# Patient Record
Sex: Female | Born: 1935
Health system: Southern US, Community
[De-identification: ages and names within clinical notes are randomized; demographics above are authoritative.]

## PROBLEM LIST (undated history)

## (undated) DIAGNOSIS — A809 Acute poliomyelitis, unspecified: Secondary | ICD-10-CM

## (undated) DIAGNOSIS — I10 Essential (primary) hypertension: Secondary | ICD-10-CM

## (undated) HISTORY — PX: ABDOMINAL HYSTERECTOMY: SHX81

## (undated) HISTORY — PX: CHOLECYSTECTOMY: SHX55

---

## 2012-11-30 DIAGNOSIS — N951 Menopausal and female climacteric states: Secondary | ICD-10-CM | POA: Insufficient documentation

## 2013-08-10 DIAGNOSIS — E119 Type 2 diabetes mellitus without complications: Secondary | ICD-10-CM | POA: Insufficient documentation

## 2013-08-10 DIAGNOSIS — E7800 Pure hypercholesterolemia, unspecified: Secondary | ICD-10-CM | POA: Insufficient documentation

## 2013-08-10 DIAGNOSIS — I10 Essential (primary) hypertension: Secondary | ICD-10-CM

## 2013-08-10 DIAGNOSIS — M1990 Unspecified osteoarthritis, unspecified site: Secondary | ICD-10-CM | POA: Insufficient documentation

## 2014-11-16 DIAGNOSIS — M545 Low back pain, unspecified: Secondary | ICD-10-CM | POA: Insufficient documentation

## 2014-11-16 DIAGNOSIS — G8929 Other chronic pain: Secondary | ICD-10-CM | POA: Insufficient documentation

## 2014-11-16 DIAGNOSIS — M25559 Pain in unspecified hip: Secondary | ICD-10-CM

## 2014-11-27 DIAGNOSIS — M1612 Unilateral primary osteoarthritis, left hip: Secondary | ICD-10-CM | POA: Insufficient documentation

## 2014-11-27 DIAGNOSIS — M5136 Other intervertebral disc degeneration, lumbar region: Secondary | ICD-10-CM | POA: Insufficient documentation

## 2015-01-03 DIAGNOSIS — M4316 Spondylolisthesis, lumbar region: Secondary | ICD-10-CM | POA: Insufficient documentation

## 2015-02-07 DIAGNOSIS — M5416 Radiculopathy, lumbar region: Secondary | ICD-10-CM | POA: Insufficient documentation

## 2015-04-24 DIAGNOSIS — S51019A Laceration without foreign body of unspecified elbow, initial encounter: Secondary | ICD-10-CM | POA: Insufficient documentation

## 2015-04-30 DIAGNOSIS — M48061 Spinal stenosis, lumbar region without neurogenic claudication: Secondary | ICD-10-CM | POA: Insufficient documentation

## 2015-05-30 DIAGNOSIS — R296 Repeated falls: Secondary | ICD-10-CM | POA: Insufficient documentation

## 2015-05-30 DIAGNOSIS — R42 Dizziness and giddiness: Secondary | ICD-10-CM | POA: Insufficient documentation

## 2015-05-30 DIAGNOSIS — R351 Nocturia: Secondary | ICD-10-CM | POA: Insufficient documentation

## 2016-08-15 DIAGNOSIS — M5136 Other intervertebral disc degeneration, lumbar region: Secondary | ICD-10-CM | POA: Diagnosis not present

## 2016-08-15 DIAGNOSIS — R410 Disorientation, unspecified: Secondary | ICD-10-CM | POA: Diagnosis not present

## 2016-08-15 DIAGNOSIS — I10 Essential (primary) hypertension: Secondary | ICD-10-CM | POA: Diagnosis not present

## 2016-08-15 DIAGNOSIS — Z23 Encounter for immunization: Secondary | ICD-10-CM | POA: Diagnosis not present

## 2016-08-15 DIAGNOSIS — E7800 Pure hypercholesterolemia, unspecified: Secondary | ICD-10-CM | POA: Diagnosis not present

## 2016-08-15 DIAGNOSIS — E119 Type 2 diabetes mellitus without complications: Secondary | ICD-10-CM | POA: Diagnosis not present

## 2016-08-26 DIAGNOSIS — R410 Disorientation, unspecified: Secondary | ICD-10-CM | POA: Diagnosis not present

## 2016-08-26 DIAGNOSIS — R4182 Altered mental status, unspecified: Secondary | ICD-10-CM | POA: Diagnosis not present

## 2017-05-20 ENCOUNTER — Inpatient Hospital Stay (HOSPITAL_BASED_OUTPATIENT_CLINIC_OR_DEPARTMENT_OTHER)
Admission: EM | Admit: 2017-05-20 | Discharge: 2017-05-27 | DRG: 308 | Disposition: A | Discharge status: Rehabilitation Facility | Payer: Medicare Other | Attending: Internal Medicine | Admitting: Internal Medicine

## 2017-05-20 ENCOUNTER — Other Ambulatory Visit: Payer: Self-pay

## 2017-05-20 ENCOUNTER — Emergency Department (HOSPITAL_BASED_OUTPATIENT_CLINIC_OR_DEPARTMENT_OTHER): Payer: Medicare Other

## 2017-05-20 ENCOUNTER — Encounter (HOSPITAL_BASED_OUTPATIENT_CLINIC_OR_DEPARTMENT_OTHER): Payer: Self-pay | Admitting: Emergency Medicine

## 2017-05-20 DIAGNOSIS — I674 Hypertensive encephalopathy: Secondary | ICD-10-CM | POA: Diagnosis present

## 2017-05-20 DIAGNOSIS — R4701 Aphasia: Secondary | ICD-10-CM | POA: Diagnosis not present

## 2017-05-20 DIAGNOSIS — R488 Other symbolic dysfunctions: Secondary | ICD-10-CM | POA: Diagnosis present

## 2017-05-20 DIAGNOSIS — Z538 Procedure and treatment not carried out for other reasons: Secondary | ICD-10-CM | POA: Diagnosis not present

## 2017-05-20 DIAGNOSIS — R296 Repeated falls: Secondary | ICD-10-CM | POA: Diagnosis not present

## 2017-05-20 DIAGNOSIS — I160 Hypertensive urgency: Secondary | ICD-10-CM | POA: Diagnosis not present

## 2017-05-20 DIAGNOSIS — Z950 Presence of cardiac pacemaker: Secondary | ICD-10-CM

## 2017-05-20 DIAGNOSIS — R7989 Other specified abnormal findings of blood chemistry: Secondary | ICD-10-CM | POA: Diagnosis not present

## 2017-05-20 DIAGNOSIS — R001 Bradycardia, unspecified: Secondary | ICD-10-CM | POA: Diagnosis not present

## 2017-05-20 DIAGNOSIS — Z8612 Personal history of poliomyelitis: Secondary | ICD-10-CM | POA: Diagnosis not present

## 2017-05-20 DIAGNOSIS — K5900 Constipation, unspecified: Secondary | ICD-10-CM | POA: Diagnosis present

## 2017-05-20 DIAGNOSIS — Z90710 Acquired absence of both cervix and uterus: Secondary | ICD-10-CM | POA: Diagnosis not present

## 2017-05-20 DIAGNOSIS — R29702 NIHSS score 2: Secondary | ICD-10-CM | POA: Diagnosis present

## 2017-05-20 DIAGNOSIS — Z79899 Other long term (current) drug therapy: Secondary | ICD-10-CM | POA: Diagnosis not present

## 2017-05-20 DIAGNOSIS — F05 Delirium due to known physiological condition: Secondary | ICD-10-CM | POA: Diagnosis not present

## 2017-05-20 DIAGNOSIS — R278 Other lack of coordination: Secondary | ICD-10-CM | POA: Diagnosis present

## 2017-05-20 DIAGNOSIS — R9401 Abnormal electroencephalogram [EEG]: Secondary | ICD-10-CM | POA: Diagnosis present

## 2017-05-20 DIAGNOSIS — I10 Essential (primary) hypertension: Secondary | ICD-10-CM | POA: Diagnosis not present

## 2017-05-20 DIAGNOSIS — D62 Acute posthemorrhagic anemia: Secondary | ICD-10-CM | POA: Diagnosis not present

## 2017-05-20 DIAGNOSIS — I361 Nonrheumatic tricuspid (valve) insufficiency: Secondary | ICD-10-CM | POA: Diagnosis not present

## 2017-05-20 DIAGNOSIS — I69398 Other sequelae of cerebral infarction: Secondary | ICD-10-CM | POA: Diagnosis not present

## 2017-05-20 DIAGNOSIS — R29704 NIHSS score 4: Secondary | ICD-10-CM | POA: Diagnosis not present

## 2017-05-20 DIAGNOSIS — I459 Conduction disorder, unspecified: Secondary | ICD-10-CM | POA: Diagnosis present

## 2017-05-20 DIAGNOSIS — Z803 Family history of malignant neoplasm of breast: Secondary | ICD-10-CM

## 2017-05-20 DIAGNOSIS — G9340 Encephalopathy, unspecified: Secondary | ICD-10-CM

## 2017-05-20 DIAGNOSIS — I63532 Cerebral infarction due to unspecified occlusion or stenosis of left posterior cerebral artery: Secondary | ICD-10-CM | POA: Diagnosis not present

## 2017-05-20 DIAGNOSIS — I442 Atrioventricular block, complete: Secondary | ICD-10-CM | POA: Diagnosis not present

## 2017-05-20 DIAGNOSIS — R413 Other amnesia: Secondary | ICD-10-CM | POA: Diagnosis not present

## 2017-05-20 DIAGNOSIS — E46 Unspecified protein-calorie malnutrition: Secondary | ICD-10-CM | POA: Diagnosis not present

## 2017-05-20 DIAGNOSIS — Z800 Family history of malignant neoplasm of digestive organs: Secondary | ICD-10-CM

## 2017-05-20 DIAGNOSIS — E876 Hypokalemia: Secondary | ICD-10-CM | POA: Diagnosis present

## 2017-05-20 DIAGNOSIS — K5901 Slow transit constipation: Secondary | ICD-10-CM

## 2017-05-20 DIAGNOSIS — N390 Urinary tract infection, site not specified: Secondary | ICD-10-CM | POA: Diagnosis not present

## 2017-05-20 DIAGNOSIS — B952 Enterococcus as the cause of diseases classified elsewhere: Secondary | ICD-10-CM | POA: Diagnosis present

## 2017-05-20 DIAGNOSIS — F0390 Unspecified dementia without behavioral disturbance: Secondary | ICD-10-CM | POA: Diagnosis present

## 2017-05-20 DIAGNOSIS — I69320 Aphasia following cerebral infarction: Secondary | ICD-10-CM | POA: Diagnosis not present

## 2017-05-20 DIAGNOSIS — I441 Atrioventricular block, second degree: Secondary | ICD-10-CM | POA: Diagnosis not present

## 2017-05-20 DIAGNOSIS — N179 Acute kidney failure, unspecified: Secondary | ICD-10-CM | POA: Diagnosis not present

## 2017-05-20 DIAGNOSIS — I639 Cerebral infarction, unspecified: Secondary | ICD-10-CM | POA: Diagnosis not present

## 2017-05-20 DIAGNOSIS — I6389 Other cerebral infarction: Secondary | ICD-10-CM | POA: Diagnosis not present

## 2017-05-20 DIAGNOSIS — R7303 Prediabetes: Secondary | ICD-10-CM | POA: Diagnosis present

## 2017-05-20 DIAGNOSIS — E785 Hyperlipidemia, unspecified: Secondary | ICD-10-CM | POA: Diagnosis present

## 2017-05-20 DIAGNOSIS — G4700 Insomnia, unspecified: Secondary | ICD-10-CM | POA: Diagnosis present

## 2017-05-20 DIAGNOSIS — R0989 Other specified symptoms and signs involving the circulatory and respiratory systems: Secondary | ICD-10-CM | POA: Diagnosis not present

## 2017-05-20 DIAGNOSIS — I4430 Unspecified atrioventricular block: Secondary | ICD-10-CM | POA: Diagnosis not present

## 2017-05-20 DIAGNOSIS — I169 Hypertensive crisis, unspecified: Secondary | ICD-10-CM | POA: Diagnosis not present

## 2017-05-20 DIAGNOSIS — R262 Difficulty in walking, not elsewhere classified: Secondary | ICD-10-CM | POA: Diagnosis present

## 2017-05-20 DIAGNOSIS — R339 Retention of urine, unspecified: Secondary | ICD-10-CM | POA: Diagnosis present

## 2017-05-20 DIAGNOSIS — I6330 Cerebral infarction due to thrombosis of unspecified cerebral artery: Secondary | ICD-10-CM

## 2017-05-20 DIAGNOSIS — I6381 Other cerebral infarction due to occlusion or stenosis of small artery: Secondary | ICD-10-CM | POA: Diagnosis not present

## 2017-05-20 DIAGNOSIS — A499 Bacterial infection, unspecified: Secondary | ICD-10-CM | POA: Diagnosis not present

## 2017-05-20 HISTORY — DX: Essential (primary) hypertension: I10

## 2017-05-20 HISTORY — DX: Acute poliomyelitis, unspecified: A80.9

## 2017-05-20 LAB — COMPREHENSIVE METABOLIC PANEL
ALBUMIN: 3.6 g/dL (ref 3.5–5.0)
ALK PHOS: 76 U/L (ref 38–126)
ALT: 14 U/L (ref 14–54)
ANION GAP: 6 (ref 5–15)
AST: 14 U/L — ABNORMAL LOW (ref 15–41)
BILIRUBIN TOTAL: 0.5 mg/dL (ref 0.3–1.2)
BUN: 22 mg/dL — ABNORMAL HIGH (ref 6–20)
CALCIUM: 9 mg/dL (ref 8.9–10.3)
CO2: 28 mmol/L (ref 22–32)
CREATININE: 1.01 mg/dL — AB (ref 0.44–1.00)
Chloride: 108 mmol/L (ref 101–111)
GFR calc non Af Amer: 49 mL/min — ABNORMAL LOW (ref >60)
GFR, EST AFRICAN AMERICAN: 57 mL/min — AB (ref >60)
GLUCOSE: 96 mg/dL (ref 65–99)
Potassium: 3.6 mmol/L (ref 3.5–5.1)
Sodium: 142 mmol/L (ref 135–145)
TOTAL PROTEIN: 6.4 g/dL — AB (ref 6.5–8.1)

## 2017-05-20 LAB — URINALYSIS, ROUTINE W REFLEX MICROSCOPIC
BILIRUBIN URINE: NEGATIVE
Glucose, UA: NEGATIVE mg/dL
HGB URINE DIPSTICK: NEGATIVE
KETONES UR: NEGATIVE mg/dL
NITRITE: NEGATIVE
PH: 6 (ref 5.0–8.0)
Protein, ur: 100 mg/dL — AB
SPECIFIC GRAVITY, URINE: 1.025 (ref 1.005–1.030)

## 2017-05-20 LAB — URINALYSIS, MICROSCOPIC (REFLEX)

## 2017-05-20 LAB — CBC WITH DIFFERENTIAL/PLATELET
Basophils Absolute: 0 10*3/uL (ref 0.0–0.1)
Basophils Relative: 0 %
Eosinophils Absolute: 0.1 10*3/uL (ref 0.0–0.7)
Eosinophils Relative: 2 %
HEMATOCRIT: 37.3 % (ref 36.0–46.0)
HEMOGLOBIN: 12.1 g/dL (ref 12.0–15.0)
LYMPHS ABS: 1.1 10*3/uL (ref 0.7–4.0)
Lymphocytes Relative: 15 %
MCH: 28.3 pg (ref 26.0–34.0)
MCHC: 32.4 g/dL (ref 30.0–36.0)
MCV: 87.4 fL (ref 78.0–100.0)
MONOS PCT: 7 %
Monocytes Absolute: 0.5 10*3/uL (ref 0.1–1.0)
NEUTROS ABS: 5.5 10*3/uL (ref 1.7–7.7)
NEUTROS PCT: 76 %
Platelets: 230 10*3/uL (ref 150–400)
RBC: 4.27 MIL/uL (ref 3.87–5.11)
RDW: 14.6 % (ref 11.5–15.5)
WBC: 7.2 10*3/uL (ref 4.0–10.5)

## 2017-05-20 LAB — TROPONIN I: Troponin I: <0.03 ng/mL (ref <0.03)

## 2017-05-20 MED ORDER — HYDRALAZINE HCL 25 MG PO TABS: 25.0000 mg | ORAL_TABLET | Freq: Four times a day (QID) | ORAL | Status: DC | PRN

## 2017-05-20 MED ORDER — LABETALOL HCL 5 MG/ML IV SOLN: 10.0000 mg | Freq: Once | INTRAVENOUS | Status: DC

## 2017-05-20 MED ORDER — HYDRALAZINE HCL 20 MG/ML IJ SOLN
10.0000 mg | Freq: Once | INTRAMUSCULAR | Status: AC
  Administered 2017-05-20: 10 mg via INTRAVENOUS
  Filled 2017-05-20: qty 1

## 2017-05-20 MED ORDER — LISINOPRIL 20 MG PO TABS
20.0000 mg | ORAL_TABLET | Freq: Every day | ORAL | Status: DC
  Administered 2017-05-20 – 2017-05-25 (×6): 20 mg via ORAL
  Filled 2017-05-20 (×2): qty 1
  Filled 2017-05-20: qty 2
  Filled 2017-05-20 (×3): qty 1

## 2017-05-20 MED ORDER — AMLODIPINE BESYLATE 10 MG PO TABS
10.0000 mg | ORAL_TABLET | Freq: Every day | ORAL | Status: DC
  Administered 2017-05-20 – 2017-05-22 (×3): 10 mg via ORAL
  Filled 2017-05-20: qty 1
  Filled 2017-05-20: qty 2
  Filled 2017-05-20: qty 1

## 2017-05-20 NOTE — ED Notes (Signed)
Pt on monitor 

## 2017-05-20 NOTE — ED Notes (Signed)
Talked to Alinda Moneyony  (pt son)  He was given room number and stated that he would see his mom in am

## 2017-05-20 NOTE — Significant Event (Signed)
[facility]. Cardiology already consulted. Patient has Hypertensive urgency and heart block.

## 2017-05-20 NOTE — ED Triage Notes (Signed)
Pt having some expressive aphasia for over a year.  Other people have noted some memory loss in past few months.  Son states he noticed short term memory loss for four days.  Negative BEFAST and VAN.  Pt has not been taking her BP medication since march.  Has PCP appointment next week.

## 2017-05-20 NOTE — ED Provider Notes (Signed)
[facility] EMERGENCY DEPARTMENT Provider Note   CSN: [id number] Arrival date & time: 05/20/17  1042     History   Chief Complaint Chief Complaint  Patient presents with  . Memory Loss    HPI Nicole [name] is a 83 y.o. female.  HPI Patient lives with son.  Has noticed increased confusion over the last 6 days.  Patient is also had some difficulty with word finding.  Noted not to be taking her blood pressure medication for the past 9 months.  She does have frequent falls but none recently.  Currently the patient is denying any pain including headache, neck pain, chest pain, back pain, abdominal pain.  No recent fever or chills. Past Medical History:  Diagnosis Date  . Hypertension   . Polio     There are no active problems to display for this patient.     OB History    No data available       Home Medications    Prior to Admission medications   Not on File    Family History No family history on file.  Social History Social History   Tobacco Use  . Smoking status: Never Smoker  . Smokeless tobacco: Never Used  Substance Use Topics  . Alcohol use: Not on file  . Drug use: Not on file     Allergies   Patient has no known allergies.   Review of Systems Review of Systems  Constitutional: Negative for chills and fever.  Eyes: Negative for visual disturbance.  Respiratory: Negative for cough and shortness of breath.   Cardiovascular: Negative for chest pain.  Gastrointestinal: Negative for abdominal pain, nausea and vomiting.  Genitourinary: Negative for difficulty urinating, dysuria and frequency.  Musculoskeletal: Negative for back pain and neck pain.  Skin: Negative for rash and wound.  Neurological: Positive for speech difficulty. Negative for dizziness, weakness, light-headedness, numbness and headaches.  Psychiatric/Behavioral: Positive for confusion.  All other systems reviewed and are negative.    Physical Exam Updated Vital  Signs BP (!) 200/48 (BP Location: Left Arm)   Pulse (!) 41   Temp 98.1 F (36.7 C) (Oral)   Resp 20   SpO2 100%   Physical Exam  Constitutional: She is oriented to person, place, and time. She appears well-developed and well-nourished.  HENT:  Head: Normocephalic and atraumatic.  Mouth/Throat: Oropharynx is clear and moist.  No obvious scalp trauma.  Eyes: EOM are normal. Pupils are equal, round, and reactive to light.  Neck: Normal range of motion. Neck supple.  No posterior midline cervical tenderness to palpation.  Cardiovascular: Regular rhythm.  Bradycardia  Pulmonary/Chest: Effort normal.  Few crackles in bilateral bases.  Abdominal: Soft. Bowel sounds are normal. There is no tenderness. There is no rebound and no guarding.  Musculoskeletal: Normal range of motion. She exhibits no edema or tenderness.  No midline thoracic or lumbar tenderness.  No CVA tenderness.  No lower extremity swelling, asymmetry or tenderness.  Neurological: She is alert and oriented to person, place, and time.  Patient is alert and oriented x3 with clear, goal oriented speech. Patient has 5/5 motor in all extremities. Sensation is intact to light touch. Bilateral finger-to-nose is normal with no signs of dysmetria. Patient has a normal gait and walks without assistance.  Skin: Skin is warm and dry. Capillary refill takes less than 2 seconds. No rash noted. No erythema.  Psychiatric: She has a normal mood and affect. Her behavior is normal.  Nursing note and  vitals reviewed.    ED Treatments / Results  Labs (all labs ordered are listed, but only abnormal results are displayed) Labs Reviewed  COMPREHENSIVE METABOLIC PANEL - Abnormal; Notable for the following components:      Result Value   BUN 22 (*)    Creatinine, Ser 1.01 (*)    Total Protein 6.4 (*)    AST 14 (*)    GFR calc non Af Amer 49 (*)    GFR calc Af Amer 57 (*)    All other components within normal limits  URINALYSIS, ROUTINE W  REFLEX MICROSCOPIC - Abnormal; Notable for the following components:   APPearance CLOUDY (*)    Protein, ur 100 (*)    Leukocytes, UA SMALL (*)    All other components within normal limits  URINALYSIS, MICROSCOPIC (REFLEX) - Abnormal; Notable for the following components:   Bacteria, UA FEW (*)    Squamous Epithelial / LPF 0-5 (*)    All other components within normal limits  URINE CULTURE  CBC WITH DIFFERENTIAL/PLATELET  TROPONIN I    EKG  EKG Interpretation  Date/Time:  Wednesday May 20 2017 11:28:02 EST Ventricular Rate:  40 PR Interval:    QRS Duration: 101 QT Interval:  472 QTC Calculation: 385 R Axis:   -21 Text Interpretation:  Sinus bradycardia LVH with secondary repolarization abnormality Confirmed by Loren RacerYelverton, Jere Vanburen ([id number]) on 05/20/2017 2:36:26 PM       Radiology Ct Head Wo Contrast  Result Date: 05/20/2017 CLINICAL DATA:  83 year old female with memory loss symptoms for the past few months, new onset short-term memory changes in the past 4 days. EXAM: CT HEAD WITHOUT CONTRAST TECHNIQUE: Contiguous axial images were obtained from the base of the skull through the vertex without intravenous contrast. COMPARISON:  [facility] without contrast 08/26/2016 FINDINGS: Brain: Stable cerebral volume since March. Ex vacuo appearing mild for age ventricular prominence. No evidence of transependymal edema. No midline shift, mass effect, or evidence of intracranial mass lesion. No acute intracranial hemorrhage identified. Patchy and confluent mostly frontal horn region periventricular white matter hypodensity appears stable. No cortical encephalomalacia identified. No cortically based acute infarct identified. Vascular: Calcified atherosclerosis at the skull base. No suspicious intracranial vascular hyperdensity. Skull: Stable.  No acute osseous abnormality identified. Sinuses/Orbits: Visualized paranasal sinuses and mastoids are stable and well  pneumatized. Other: Stable and negative visualized orbit and scalp soft tissues. IMPRESSION: No acute intracranial abnormality and stable non contrast CT appearance of the brain since March 2018. Cerebral volume loss with ex vacuo appearing ventricular enlargement, and nonspecific frontal horn region chronic white matter changes. Electronically Signed   By: Odessa FlemingH  Hall M.D.   On: 05/20/2017 11:56    Procedures Procedures (including critical care time)  Medications Ordered in ED Medications  hydrALAZINE (APRESOLINE) injection 10 mg (10 mg Intravenous Given 05/20/17 1211)     Initial Impression / Assessment and Plan / ED Course  I have reviewed the triage vital signs and the nursing notes.  Pertinent labs & imaging results that were available during my care of the patient were reviewed by me and considered in my medical decision making (see chart for details).    Given dose of hydralazine with improvement of blood pressure.  CT head without acute findings.  Discussed with Dr. Swazil[place] of cardiology who reviewed the patient's EKG.  May need pacemaker placed for heart block.  Discussed with Dr. Dartha Lodgegbata who will accept patient in transfer to telemetry bed.  Final  Clinical Impressions(s) / ED Diagnoses   Final diagnoses:  Hypertensive urgency  Mobitz type 2 second degree heart block    ED Discharge Orders    None       Loren Racer, MD 05/20/17 1437

## 2017-05-21 ENCOUNTER — Inpatient Hospital Stay (HOSPITAL_COMMUNITY): Payer: Medicare Other

## 2017-05-21 ENCOUNTER — Inpatient Hospital Stay (HOSPITAL_COMMUNITY)
Admission: EM | Disposition: A | Discharge status: Rehabilitation Facility | Payer: Self-pay | Source: Home / Self Care | Attending: Internal Medicine

## 2017-05-21 ENCOUNTER — Encounter (HOSPITAL_COMMUNITY): Payer: Self-pay | Admitting: Family Medicine

## 2017-05-21 DIAGNOSIS — I4430 Unspecified atrioventricular block: Secondary | ICD-10-CM

## 2017-05-21 DIAGNOSIS — R001 Bradycardia, unspecified: Secondary | ICD-10-CM

## 2017-05-21 DIAGNOSIS — R413 Other amnesia: Secondary | ICD-10-CM | POA: Diagnosis present

## 2017-05-21 DIAGNOSIS — G9340 Encephalopathy, unspecified: Secondary | ICD-10-CM | POA: Diagnosis present

## 2017-05-21 DIAGNOSIS — I361 Nonrheumatic tricuspid (valve) insufficiency: Secondary | ICD-10-CM

## 2017-05-21 DIAGNOSIS — Z538 Procedure and treatment not carried out for other reasons: Secondary | ICD-10-CM

## 2017-05-21 DIAGNOSIS — I442 Atrioventricular block, complete: Secondary | ICD-10-CM

## 2017-05-21 DIAGNOSIS — I459 Conduction disorder, unspecified: Secondary | ICD-10-CM | POA: Diagnosis present

## 2017-05-21 HISTORY — PX: PACEMAKER IMPLANT: EP1218

## 2017-05-21 LAB — BASIC METABOLIC PANEL
Anion gap: 6 (ref 5–15)
BUN: 15 mg/dL (ref 6–20)
CALCIUM: 9.2 mg/dL (ref 8.9–10.3)
CO2: 28 mmol/L (ref 22–32)
CREATININE: 0.88 mg/dL (ref 0.44–1.00)
Chloride: 109 mmol/L (ref 101–111)
GFR calc Af Amer: >60 mL/min (ref >60)
GFR, EST NON AFRICAN AMERICAN: 58 mL/min — AB (ref >60)
GLUCOSE: 113 mg/dL — AB (ref 65–99)
Potassium: 3.4 mmol/L — ABNORMAL LOW (ref 3.5–5.1)
SODIUM: 143 mmol/L (ref 135–145)

## 2017-05-21 LAB — MRSA PCR SCREENING: MRSA by PCR: NEGATIVE

## 2017-05-21 LAB — SURGICAL PCR SCREEN
MRSA, PCR: NEGATIVE
STAPHYLOCOCCUS AUREUS: POSITIVE — AB

## 2017-05-21 LAB — URINE CULTURE

## 2017-05-21 LAB — TROPONIN I
TROPONIN I: 0.03 ng/mL — AB (ref <0.03)
Troponin I: <0.03 ng/mL (ref <0.03)
Troponin I: <0.03 ng/mL (ref <0.03)

## 2017-05-21 LAB — CBC
HEMATOCRIT: 41 % (ref 36.0–46.0)
Hemoglobin: 13.2 g/dL (ref 12.0–15.0)
MCH: 28.3 pg (ref 26.0–34.0)
MCHC: 32.2 g/dL (ref 30.0–36.0)
MCV: 87.8 fL (ref 78.0–100.0)
Platelets: 213 10*3/uL (ref 150–400)
RBC: 4.67 MIL/uL (ref 3.87–5.11)
RDW: 14.9 % (ref 11.5–15.5)
WBC: 14.2 10*3/uL — ABNORMAL HIGH (ref 4.0–10.5)

## 2017-05-21 LAB — RPR: RPR: NONREACTIVE

## 2017-05-21 LAB — ECHOCARDIOGRAM COMPLETE: WEIGHTICAEL: 2096 [oz_av]

## 2017-05-21 LAB — AMMONIA: Ammonia: 13 umol/L (ref 9–35)

## 2017-05-21 LAB — CREATININE, SERUM
Creatinine, Ser: 0.92 mg/dL (ref 0.44–1.00)
GFR calc Af Amer: >60 mL/min (ref >60)
GFR calc non Af Amer: 55 mL/min — ABNORMAL LOW (ref >60)

## 2017-05-21 LAB — VITAMIN B12: VITAMIN B 12: 488 pg/mL (ref 180–914)

## 2017-05-21 LAB — MAGNESIUM: Magnesium: 2 mg/dL (ref 1.7–2.4)

## 2017-05-21 LAB — TSH: TSH: 1.246 u[IU]/mL (ref 0.350–4.500)

## 2017-05-21 SURGERY — PACEMAKER IMPLANT
Anesthesia: LOCAL

## 2017-05-21 MED ORDER — PROMETHAZINE HCL 25 MG/ML IJ SOLN
INTRAMUSCULAR | Status: DC | PRN
  Administered 2017-05-21: 12.5 mg via INTRAVENOUS

## 2017-05-21 MED ORDER — SODIUM CHLORIDE 0.9% FLUSH
3.0000 mL | Freq: Two times a day (BID) | INTRAVENOUS | Status: DC
  Administered 2017-05-21 – 2017-05-27 (×9): 3 mL via INTRAVENOUS

## 2017-05-21 MED ORDER — SENNOSIDES-DOCUSATE SODIUM 8.6-50 MG PO TABS: 1.0000 | ORAL_TABLET | Freq: Every evening | ORAL | Status: DC | PRN

## 2017-05-21 MED ORDER — SODIUM CHLORIDE 0.9 % IR SOLN
Status: AC
  Filled 2017-05-21: qty 2

## 2017-05-21 MED ORDER — ONDANSETRON HCL 4 MG/2ML IJ SOLN
INTRAMUSCULAR | Status: DC | PRN
  Administered 2017-05-21: 4 mg via INTRAVENOUS

## 2017-05-21 MED ORDER — SODIUM CHLORIDE 0.9% FLUSH: 3.0000 mL | Freq: Two times a day (BID) | INTRAVENOUS | Status: DC

## 2017-05-21 MED ORDER — CHLORHEXIDINE GLUCONATE CLOTH 2 % EX PADS
6.0000 | MEDICATED_PAD | Freq: Every day | CUTANEOUS | Status: AC
  Administered 2017-05-21 – 2017-05-25 (×4): 6 via TOPICAL

## 2017-05-21 MED ORDER — HEPARIN (PORCINE) IN NACL 2-0.9 UNIT/ML-% IJ SOLN
INTRAMUSCULAR | Status: AC
  Filled 2017-05-21: qty 500

## 2017-05-21 MED ORDER — POTASSIUM CHLORIDE CRYS ER 20 MEQ PO TBCR
20.0000 meq | EXTENDED_RELEASE_TABLET | Freq: Once | ORAL | Status: AC
  Administered 2017-05-21: 20 meq via ORAL
  Filled 2017-05-21: qty 1

## 2017-05-21 MED ORDER — ACETAMINOPHEN 650 MG RE SUPP: 650.0000 mg | Freq: Four times a day (QID) | RECTAL | Status: DC | PRN

## 2017-05-21 MED ORDER — IOPAMIDOL (ISOVUE-370) INJECTION 76%
INTRAVENOUS | Status: DC | PRN
  Administered 2017-05-21: 10 mL via INTRAVENOUS

## 2017-05-21 MED ORDER — MUPIROCIN 2 % EX OINT
1.0000 "application " | TOPICAL_OINTMENT | Freq: Two times a day (BID) | CUTANEOUS | Status: DC
  Administered 2017-05-21 – 2017-05-26 (×10): 1 via NASAL
  Filled 2017-05-21 (×2): qty 22

## 2017-05-21 MED ORDER — BUPIVACAINE HCL (PF) 0.25 % IJ SOLN: INTRAMUSCULAR | Status: DC | PRN

## 2017-05-21 MED ORDER — SODIUM CHLORIDE 0.9 % IV SOLN: 250.0000 mL | INTRAVENOUS | Status: DC

## 2017-05-21 MED ORDER — HYDRALAZINE HCL 20 MG/ML IJ SOLN
5.0000 mg | INTRAMUSCULAR | Status: DC | PRN
  Administered 2017-05-21 – 2017-05-22 (×2): 5 mg via INTRAVENOUS
  Filled 2017-05-21 (×2): qty 1

## 2017-05-21 MED ORDER — PROMETHAZINE HCL 25 MG/ML IJ SOLN
INTRAMUSCULAR | Status: AC
  Filled 2017-05-21: qty 1

## 2017-05-21 MED ORDER — SODIUM CHLORIDE 0.9% FLUSH: 3.0000 mL | INTRAVENOUS | Status: DC | PRN

## 2017-05-21 MED ORDER — ONDANSETRON HCL 4 MG/2ML IJ SOLN: 4.0000 mg | Freq: Four times a day (QID) | INTRAMUSCULAR | Status: DC | PRN

## 2017-05-21 MED ORDER — ONDANSETRON HCL 4 MG PO TABS
4.0000 mg | ORAL_TABLET | Freq: Four times a day (QID) | ORAL | Status: DC | PRN
  Administered 2017-05-26: 4 mg via ORAL
  Filled 2017-05-21: qty 1

## 2017-05-21 MED ORDER — LIDOCAINE HCL (PF) 1 % IJ SOLN
INTRAMUSCULAR | Status: DC | PRN
  Administered 2017-05-21: 45 mL

## 2017-05-21 MED ORDER — SODIUM CHLORIDE 0.9 % IV SOLN: INTRAVENOUS | Status: DC

## 2017-05-21 MED ORDER — IOPAMIDOL (ISOVUE-370) INJECTION 76%
INTRAVENOUS | Status: AC
  Filled 2017-05-21: qty 50

## 2017-05-21 MED ORDER — CHLORHEXIDINE GLUCONATE 4 % EX LIQD: 60.0000 mL | Freq: Once | CUTANEOUS | Status: DC

## 2017-05-21 MED ORDER — HYDROCODONE-ACETAMINOPHEN 5-325 MG PO TABS
1.0000 | ORAL_TABLET | ORAL | Status: DC | PRN
Start: 2017-05-21 — End: 2017-05-26

## 2017-05-21 MED ORDER — ACETAMINOPHEN 325 MG PO TABS: 650.0000 mg | ORAL_TABLET | Freq: Four times a day (QID) | ORAL | Status: DC | PRN

## 2017-05-21 MED ORDER — ONDANSETRON HCL 4 MG/2ML IJ SOLN
INTRAMUSCULAR | Status: AC
  Filled 2017-05-21: qty 2

## 2017-05-21 MED ORDER — CEFAZOLIN SODIUM-DEXTROSE 2-4 GM/100ML-% IV SOLN
INTRAVENOUS | Status: AC
  Filled 2017-05-21: qty 100

## 2017-05-21 MED ORDER — CEFAZOLIN SODIUM-DEXTROSE 2-4 GM/100ML-% IV SOLN
2.0000 g | INTRAVENOUS | Status: AC
  Administered 2017-05-21: 2 g via INTRAVENOUS

## 2017-05-21 MED ORDER — ONDANSETRON HCL 4 MG/2ML IJ SOLN
4.0000 mg | Freq: Four times a day (QID) | INTRAMUSCULAR | Status: DC | PRN
  Administered 2017-05-21: 4 mg via INTRAVENOUS
  Filled 2017-05-21: qty 2

## 2017-05-21 MED ORDER — SODIUM CHLORIDE 0.9 % IR SOLN: 80.0000 mg | Status: DC

## 2017-05-21 MED ORDER — ACETAMINOPHEN 325 MG PO TABS: 325.0000 mg | ORAL_TABLET | ORAL | Status: DC | PRN

## 2017-05-21 MED ORDER — ENOXAPARIN SODIUM 40 MG/0.4ML ~~LOC~~ SOLN
40.0000 mg | SUBCUTANEOUS | Status: DC
  Administered 2017-05-21 – 2017-05-27 (×7): 40 mg via SUBCUTANEOUS
  Filled 2017-05-21 (×6): qty 0.4

## 2017-05-21 MED ORDER — POTASSIUM CHLORIDE IN NACL 20-0.9 MEQ/L-% IV SOLN
INTRAVENOUS | Status: AC
  Administered 2017-05-21: 03:00:00 via INTRAVENOUS
  Filled 2017-05-21: qty 1000

## 2017-05-21 MED ORDER — LIDOCAINE HCL (PF) 1 % IJ SOLN
INTRAMUSCULAR | Status: AC
  Filled 2017-05-21: qty 60

## 2017-05-21 MED ORDER — HEPARIN (PORCINE) IN NACL 2-0.9 UNIT/ML-% IJ SOLN
INTRAMUSCULAR | Status: AC | PRN
  Administered 2017-05-21: 500 mL

## 2017-05-21 SURGICAL SUPPLY — 6 items
CABLE SURGICAL S-101-97-12 (CABLE) ×2 IMPLANT
GUIDEWIRE ANGLED .035X150CM (WIRE) ×2 IMPLANT
KIT MICROINTRODUCER STIFF 5F (SHEATH) ×2 IMPLANT
PAD DEFIB LIFELINK (PAD) ×2 IMPLANT
SHEATH CLASSIC 8F (SHEATH) ×4 IMPLANT
TRAY PACEMAKER INSERTION (PACKS) ×2 IMPLANT

## 2017-05-21 NOTE — Consult Note (Signed)
Cardiology Consultation:   Patient ID: Nicole [name]; [id number]; 08/01/1928   Admit date: 05/20/2017 Date of Consult: 05/21/2017  Primary Care Provider: System, Pcp Not In Primary Cardiologist: [facility], Dr. Ladona Ridgeltaylor    Patient Profile:   Nicole [name] is a 83 y.o. female with a hx of HTN only who is being seen today for the evaluation of heart block at the request of Dr. Sunnie Nielsenegalado.  History of Present Illness:   Ms. Nicole [name] is accompanied by her son and daughter in law who prvide majority of the PMHx and HPI 2/2 patient confusion/poor historian.  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  This was her only prescribed medicine.  Her son noted 1 week ago she seemed to have more episodes of having trouble findings words, more confused, the day prior to seeking attention he checked her BP found it high and had her take one of her losartan pills, t=yesterday the following day felt like she was still disoriented, though he was his brother and insisted she get seen, unable to get with her PMD (no longer working) [facility]  Initially her SBP 200's and medicines started (norvasc and lisinopril), found in 2:1 heart block and [facility] and [facility].  IM service noted normal non-contrast CT head  LABS K+ 3.4 (replaced) Mag 2.0 BUN/Creat 15/0.88 Ammonia 13 (wnl) Trop I : <0.03 x3 WBC 7.2 H/H 12/37 Plts 230 TSH 1.246  Not actively taking any prescribed medicines, once day MultiVIt only though not of late   Past Medical History:  Diagnosis Date  . Hypertension   . Polio     Past Surgical History:  Procedure Laterality Date  . ABDOMINAL HYSTERECTOMY    . CHOLECYSTECTOMY         Inpatient Medications: Scheduled Meds: . amLODipine  10 mg Oral Daily  . enoxaparin (LOVENOX) injection  40 mg Subcutaneous Q24H  . lisinopril  20 mg Oral Daily  . sodium chloride  flush  3 mL Intravenous Q12H   Continuous Infusions:  PRN Meds: acetaminophen **OR** acetaminophen, hydrALAZINE, HYDROcodone-acetaminophen, ondansetron **OR** ondansetron (ZOFRAN) IV, senna-docusate  Allergies:   No Known Allergies  Social History:   Social History   Socioeconomic History  . Marital status: Widowed    Spouse name: Not on file  . Number of children: Not on file  . Years of education: Not on file  . Highest education level: Not on file  Social Needs  . Financial resource strain: Not on file  . Food insecurity - worry: Not on file  . Food insecurity - inability: Not on file  . Transportation needs - medical: Not on file  . Transportation needs - non-medical: Not on file  Occupational History  . Not on file  Tobacco Use  . Smoking status: Never Smoker  . Smokeless tobacco: Never Used  Substance and Sexual Activity  . Alcohol use: Not on file  . Drug use: Not on file  . Sexual activity: Not on file  Other Topics Concern  . Not on file  Social History Narrative  . Not on file    Family History:   Family History  Problem Relation Age of Onset  . Cancer Mother        stomach  . Cancer Sister        breast     ROS:  Please see the history of present illness.  ROS  All other ROS reviewed and negative.     Physical Exam/Data:   Vitals:   05/21/17 0613 05/21/17 0747 05/21/17 1056 05/21/17 1131  BP: (!) 179/40 (!) 160/142 (!) 146/41 (!) 174/40  Pulse:  (!) 48  (!) 46  Resp: 13 16  16   Temp:  99.1 F (37.3 C)  98.3 F (36.8 C)  TempSrc:  Oral  Oral  SpO2:  96%  96%  Weight:        Intake/Output Summary (Last 24 hours) at 05/21/2017 1213 Last data filed at 05/21/2017 0900 Gross per 24 hour  Intake 643.33 ml  Output 600 ml  Net 43.33 ml   Filed Weights   05/20/17 2302 05/21/17 0325  Weight: 131 lb 9.6 oz (59.7 kg) 131 lb (59.4 kg)   There is no height or weight on file to calculate BMI.  General:  Well nourished, though thin, well  developed, in no acute distress HEENT: normal Lymph: no adenopathy Neck: no JVD Endocrine:  No thryomegaly Vascular: No carotid bruits Cardiac:  RRR; bradycardicno murmurs, gallops or rubs Lungs:  CTA b/l, no wheezing, rhonchi or rales  Abd: soft, nontender Ext: no edema Musculoskeletal:  No deformities, age appropriate atrophy Skin: warm and dry  Neuro:  Orient to self, unable to tell me who her visitors are, knows we are in the [place] se[place], though not the year, not oriented to place or situation, follows commands, no gross motor deficits are appreciated Psych: pleasant and cooperative   EKG:  The EKG was personally reviewed and demonstrates:   2:1 AVblock, V rate 40, 46, 42 narrow QRS Telemetry:  Telemetry was personally reviewed and demonstrates:   2:1 AVblock 40's  Relevant CV Studies:  Echo ordered  Laboratory Data:  Chemistry Recent Labs  Lab 05/20/17 1220 05/21/17 0225  NA 142 143  K 3.6 3.4*  CL 108 109  CO2 28 28  GLUCOSE 96 113*  BUN 22* 15  CREATININE 1.01* 0.88  CALCIUM 9.0 9.2  GFRNONAA 49* 58*  GFRAA 57* >60  ANIONGAP 6 6    Recent Labs  Lab 05/20/17 1220  PROT 6.4*  ALBUMIN 3.6  AST 14*  ALT 14  ALKPHOS 76  BILITOT 0.5   Hematology Recent Labs  Lab 05/20/17 1220  WBC 7.2  RBC 4.27  HGB 12.1  HCT 37.3  MCV 87.4  MCH 28.3  MCHC 32.4  RDW 14.6  PLT 230   Cardiac Enzymes Recent Labs  Lab 05/20/17 1220 05/21/17 0225 05/21/17 0700  TROPONINI <0.03 <0.03 <0.03   No results for input(s): TROPIPOC in the last 168 hours.  BNPNo results for input(s): BNP, PROBNP in the last 168 hours.  DDimer No results for input(s): DDIMER in the last 168 hours.  Radiology/Studies:   Ct Head Wo Contrast Result Date: 05/20/2017 CLINICAL DATA:  83 year old female with memory loss symptoms for the past few months, new onset short-term memory changes in the past 4 days. EXAM: CT HEAD WITHOUT CONTRAST TECHNIQUE: Contiguous axial images were  obtained from the base of the skull through the vertex without intravenous contrast. COMPARISON:  [facility] head CT without contrast 08/26/2016 FINDINGS: Brain: Stable cerebral volume since March. Ex vacuo appearing mild for age ventricular prominence. No evidence of transependymal edema. No midline shift, mass effect, or evidence of intracranial mass lesion. No acute intracranial hemorrhage identified. Patchy and confluent mostly frontal horn region periventricular white matter hypodensity appears stable. No cortical encephalomalacia identified. No cortically based  acute infarct identified. Vascular: Calcified atherosclerosis at the skull base. No suspicious intracranial vascular hyperdensity. Skull: Stable.  No acute osseous abnormality identified. Sinuses/Orbits: Visualized paranasal sinuses and mastoids are stable and well pneumatized. Other: Stable and negative visualized orbit and scalp soft tissues. IMPRESSION: No acute intracranial abnormality and stable non contrast CT appearance of the brain since March 2018. Cerebral volume loss with ex vacuo appearing ventricular enlargement, and nonspecific frontal horn region chronic white matter changes. Electronically Signed   By: Odessa FlemingH  Hall M.D.   On: 05/20/2017 11:56    Assessment and Plan:   1. Heart block, bradycardia     No reversible cause noted     Will need pacing, I discussed with patient/family, agreeable to discuss further with MD     She has been NPO here, the pt does not recall having anything to eat this AM, family just arrived     Keep NPO, plan for the afternoon, would be best to et MRI done first if needed, BP stable  2. HTN     Follow  3. Encephalopathy     Deferred to IM service     Seems this may have been creeping up over the year with intermittent forgetfullness/word finding trouble      MRI is ordered  4. Mild abn UA     Normal WBC, no urinary complaints, afebrile    For questions or updates, please  contact [facility] Please consult [email or web address] for contact info under Cardiology/STEMI.   Signed, Sheilah PigeonRenee Lynn Ursuy, PA-C  05/21/2017 12:13 PM  EP attending  Patient seen and examined.  Agree with the findings as noted above.  The patient has symptomatic 2-1 heart block and I have discussed the treatment options in detail.  The risk benefits goals and expectations of insertion of a permanent pacemaker were reviewed with the patient.  She is reflecting and will let us know if she would like to proceed with pacemaker insertion.  Sharrell KuGreg Taylor, MD

## 2017-05-21 NOTE — H&P (Signed)
History and Physical    Nicole [name] WJX:[id number] DOB: March 11, 1929 DOA: 05/20/2017  PCP: System, Pcp Not In   Patient [facility]  Chief Complaint: Confusion  HPI: Nicole [name] is a 83 y.o. female with medical history significant for hypertension, now presenting to the emergency department for evaluation of confusion.  History was obtained through family, discussion with ED personnel, and review of the EMR.  She has reportedly been having some difficulties with confusion for a year that has not previously been evaluated, but her son reports that this has worsened significantly in the past couple days.  Patient is not expressing any specific complaints.  There was no recent fall or trauma and she is not known to use illicit substances or alcohol.  She has not had a PCP and her medications ran out in March 2018.  [facility] ED Course: Upon arrival to the ED, patient is found to be afebrile, saturating well on room air, bradycardic in the 40s, and hypertensive with systolic pressures in the 200s.  EKG features a bradycardia with rate 42 and apparent Mobitz type II.  Noncontrast head CT is negative for acute intracranial abnormality.  Chemistry panel and CBC are unremarkable and troponin is undetectable.  Urine is not particularly suggestive of infection and sample was sent for culture.  Patient was treated with Norvasc and lisinopril in the emergency department and given prn doses of hydralazine.  Blood pressure improved, heart rate remained in the 40s, and there has not been any apparent respiratory distress.  Cardiology was consulted by the ED physician and [facility] possible pacemaker.  [facility] be [facility]down [facility] for ongoing evaluation and management of heart block, hypertensive urgency, and confusion.  Review of Systems:  All other systems reviewed and apart from HPI, are  negative.  Past Medical History:  Diagnosis Date  . Hypertension   . Polio     Past Surgical History:  Procedure Laterality Date  . ABDOMINAL HYSTERECTOMY    . CHOLECYSTECTOMY       reports that  has never smoked. she has never used smokeless tobacco. Her alcohol and drug histories are not on file.  No Known Allergies  History reviewed. No pertinent family history.   Prior to Admission medications   Not on File    Physical Exam: Vitals:   05/20/17 2030 05/20/17 2100 05/20/17 2302 05/21/17 0014  BP: (!) 155/46 (!) 157/52 (!) 179/59 (!) 169/45  Pulse: (!) 46 (!) 44 (!) 51   Resp: 12 (!) 22 19 16   Temp:   98.2 F (36.8 C)   TempSrc:   Oral   SpO2: 93% 93% 96%   Weight:   59.7 kg (131 lb 9.6 oz)       Constitutional: NAD, anxious Eyes: PERTLA, lids and conjunctivae normal ENMT: Mucous membranes are moist. Posterior pharynx clear of any exudate or lesions.   Neck: normal, supple, no masses, no thyromegaly Respiratory: clear to auscultation bilaterally, no wheezing, no crackles. Normal respiratory effort.   Cardiovascular: Rate ~45 and regular. No significant JVD. Abdomen: No distension, no tenderness, no masses palpated. Bowel sounds normal.  Musculoskeletal: no clubbing / cyanosis. No joint deformity upper and lower extremities.  Skin: no significant rashes, lesions, ulcers. Warm, dry, well-perfused. Neurologic: CN 2-12 grossly intact. Sensation intact. Strength 5/5 in all 4 limbs.  Psychiatric: Alert and oriented to person only. Confused and anxious.  Labs on Admission: I have personally reviewed following labs and imaging studies  CBC: Recent Labs  Lab 05/20/17 1220  WBC 7.2  NEUTROABS 5.5  HGB 12.1  HCT 37.3  MCV 87.4  PLT 230   Basic Metabolic Panel: Recent Labs  Lab 05/20/17 1220  NA 142  K 3.6  CL 108  CO2 28  GLUCOSE 96  BUN 22*  CREATININE 1.01*  CALCIUM 9.0   GFR: CrCl cannot be calculated (Unknown ideal weight.). Liver  Function Tests: Recent Labs  Lab 05/20/17 1220  AST 14*  ALT 14  ALKPHOS 76  BILITOT 0.5  PROT 6.4*  ALBUMIN 3.6   No results for input(s): LIPASE, AMYLASE in the last 168 hours. No results for input(s): AMMONIA in the last 168 hours. Coagulation Profile: No results for input(s): INR, PROTIME in the last 168 hours. Cardiac Enzymes: Recent Labs  Lab 05/20/17 1220  TROPONINI <0.03   BNP (last 3 results) No results for input(s): PROBNP in the last 8760 hours. HbA1C: No results for input(s): HGBA1C in the last 72 hours. CBG: No results for input(s): GLUCAP in the last 168 hours. Lipid Profile: No results for input(s): CHOL, HDL, LDLCALC, TRIG, CHOLHDL, LDLDIRECT in the last 72 hours. Thyroid Function Tests: No results for input(s): TSH, T4TOTAL, FREET4, T3FREE, THYROIDAB in the last 72 hours. Anemia Panel: No results for input(s): VITAMINB12, FOLATE, FERRITIN, TIBC, IRON, RETICCTPCT in the last 72 hours. Urine analysis:    Component Value Date/Time   COLORURINE YELLOW 05/20/2017 1340   APPEARANCEUR CLOUDY (A) 05/20/2017 1340   LABSPEC 1.025 05/20/2017 1340   PHURINE 6.0 05/20/2017 1340   GLUCOSEU NEGATIVE 05/20/2017 1340   HGBUR NEGATIVE 05/20/2017 1340   BILIRUBINUR NEGATIVE 05/20/2017 1340   KETONESUR NEGATIVE 05/20/2017 1340   PROTEINUR 100 (A) 05/20/2017 1340   NITRITE NEGATIVE 05/20/2017 1340   LEUKOCYTESUR SMALL (A) 05/20/2017 1340   Sepsis Labs: @LABRCNTIP (procalcitonin:4,lacticidven:4) )No results found for this or any previous visit (from the past 240 hour(s)).   Radiological Exams on Admission: Ct Head Wo Contrast  Result Date: 05/20/2017 CLINICAL DATA:  83 year old female with memory loss symptoms for the past few months, new onset short-term memory changes in the past 4 days. EXAM: CT HEAD WITHOUT CONTRAST TECHNIQUE: Contiguous axial images were obtained from the base of the skull through the vertex without intravenous contrast. COMPAR[facility] without contrast 08/26/2016 FINDINGS: Brain: Stable cerebral volume since March. Ex vacuo appearing mild for age ventricular prominence. No evidence of transependymal edema. No midline shift, mass effect, or evidence of intracranial mass lesion. No acute intracranial hemorrhage identified. Patchy and confluent mostly frontal horn region periventricular white matter hypodensity appears stable. No cortical encephalomalacia identified. No cortically based acute infarct identified. Vascular: Calcified atherosclerosis at the skull base. No suspicious intracranial vascular hyperdensity. Skull: Stable.  No acute osseous abnormality identified. Sinuses/Orbits: Visualized paranasal sinuses and mastoids are stable and well pneumatized. Other: Stable and negative visualized orbit and scalp soft tissues. IMPRESSION: No acute intracranial abnormality and stable non contrast CT appearance of the brain since March 2018. Cerebral volume loss with ex vacuo appearing ventricular enlargement, and nonspecific frontal horn region chronic white matter changes. Electronically Signed   By: Odessa FlemingH  Hall M.D.   On: 05/20/2017 11:56    EKG: Independently reviewed. Sinus bradycardia (rate 42), sinus arrhythmia, LVH.  Assessment/Plan  1. Heart block - Pt noted to be bradycardic in low 40's with apparent Mobitz type II on EKG  -  Cardiology was consulted by ED provider and recommended admission to [facility]  - Blood pressure has remained stable  - She has not been on any offending medications  - Potassium is wnl and troponin undetectable  - Check TSH, keep external pacer at bedside, follow-up cardiology recommendations   2. Hypertension with hypertensive urgency  - Pt has hx of hypertension but has not had a PCP and ran out medications in March 2018  - She was treated in ED with Norvasc, lisinopril, and prn hydralazine; BP has improved  - Continue Norvasc and lisinopril daily, use hydralazine IVP's prn    3.  Acute encephalopathy  - Pt's son reports that she has had increased confusion for past few days, but others have noted these problems for a year  - Pt is confused on exam, but speaks fluently with no focal neurologic deficit identified  - Head CT is negative for acute intracranial abnormality  - UA not particularly suggestive of infection, but was sent for culture   - Check ammonia, RPR, B12, folate, and TSH  - Continue reassurance, reorientation     DVT prophylaxis: Lovenox Code Status: Full  Family Communication: Discussed with patient Disposition Plan: Admit to SDU Consults called: Cardiology Admission status: Inpatient    Briscoe Deutscher, MD [facility] Pager [phone]  If 7PM-7AM, please contact night-coverage [email or web address] Password [email or web address]  05/21/2017, 1:23 AM
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
## 2017-05-21 NOTE — Consult Note (Signed)
Reason for Consult: Confusion Referring Physician: Dr Carmell Austriaegaldo  CC: Confusion  HPI: Nicole [name] is an 83 y.o. female [facility]/2017 for evaluation of confusion. She has a a history of polio and hypertension. According to her son her confusion began abruptly about one week ago. A CT scan was performed which revealed no acute changes; however, atrophy was noted. At time of admission the patient was noted to be bradycardic with a heart rate in the 40s and in 2-1 block as well as having elevated systolic blood pressures in the 200 range. It was noted that the patient had not been taking her blood pressure medication since March. Prior to admission the patient was not on any medications which should have contributed to heart block or bradycardia. Cardiology was consulted and a pacemaker will be recommended. An MRI of the brain is currently pending.   Past Medical History:  Diagnosis Date  . Hypertension   . Polio     Past Surgical History:  Procedure Laterality Date  . ABDOMINAL HYSTERECTOMY    . CHOLECYSTECTOMY      Family History  Problem Relation Age of Onset  . Cancer Mother        stomach  . Cancer Sister        breast    Social History:  reports that  has never smoked. she has never used smokeless tobacco. Her alcohol and drug histories are not on file.  No Known Allergies  Medications:  Scheduled: . amLODipine  10 mg Oral Daily  . enoxaparin (LOVENOX) injection  40 mg Subcutaneous Q24H  . lisinopril  20 mg Oral Daily  . sodium chloride flush  3 mL Intravenous Q12H    ROS:  .History obtained from SON. Pt too confused.  General ROS: negative for - chills, fatigue, fever, night sweats, weight gain or weight loss Psychological ROS: negative for - behavioral disorder, hallucinations, memory difficulties, mood swings or suicidal ideation Ophthalmic ROS: negative for - blurry vision, double vision, eye pain or loss of vision ENT ROS:  negative for - epistaxis, nasal discharge, oral lesions, sore throat, tinnitus or vertigo Allergy and Immunology ROS: negative for - hives or itchy/watery eyes Hematological and Lymphatic ROS: negative for - bleeding problems, bruising or swollen lymph nodes Endocrine ROS: negative for - galactorrhea, hair pattern changes, polydipsia/polyuria or temperature intolerance Respiratory ROS: negative for - cough, hemoptysis, shortness of breath or wheezing Cardiovascular ROS: negative for - chest pain, dyspnea on exertion, edema or irregular heartbeat Gastrointestinal ROS: negative for - abdominal pain, diarrhea, hematemesis, nausea/vomiting or stool incontinence Genito-Urinary ROS: negative for - dysuria, hematuria, incontinence or urinary frequency/urgency Musculoskeletal ROS: Positive for recent back pain Neurological ROS: as noted in HPI Dermatological ROS: negative for rash and skin lesion changes   Physical Examination:  Blood pressure (!) 174/40, pulse (!) 46, temperature 98.3 F (36.8 C), temperature source Oral, resp. rate 16, weight 131 lb (59.4 kg), SpO2 96 %.   General - pleasant 83 year old female in bed in no acute distress Heart - Regular rhythm - no murmer appreciated. Bradycardia rate. Lungs - Clear to auscultation Abdomen - Soft - non tender Extremities - Distal pulses intact - no edema Skin - Warm and dry  Neurologic Examination:  Mental Status:  Alert, oriented to city, state and year but not day of week or month. Abulic with poverty of thought. Speech fluent (speaks a mixture of [place] and [place]) with intact comprehension of basic commands, some of which  required repetition. Naming for common objects is intact. Has poor long-term memory, being unable to recall the names of her dogs or that of the president, but she smiles when asked for the latter and states "I know what he looks like". Also unable to recall the NP who saw her approximately 60 minutes prior to the attending  and cannot recall what she had for dinner yesterday.  Cranial Nerves:  II-bilateral visual fields intact III/IV/VI-Pupils were equal and reacted. Extraocular movements were full.  V/VII-no facial numbness and no facial weakness.  VIII-hearing normal.  X-normal speech and symmetrical palatal movement.  XII-midline tongue extension  Motor: 5/5 strength symmetrical throughout.  Muscle tone normal throughout. Sensory: Intact to light touch in all extremities. Deep Tendon Reflexes: 1/4 throughout Plantars: Downgoing bilaterally  Cerebellar: Normal finger to nose and heel to shin bilaterally. Gait: not tested   Laboratory Studies:   Basic Metabolic Panel: Recent Labs  Lab 05/20/17 1220 05/21/17 0225  NA 142 143  K 3.6 3.4*  CL 108 109  CO2 28 28  GLUCOSE 96 113*  BUN 22* 15  CREATININE 1.01* 0.88  CALCIUM 9.0 9.2  MG  --  2.0    Liver Function Tests: Recent Labs  Lab 05/20/17 1220  AST 14*  ALT 14  ALKPHOS 76  BILITOT 0.5  PROT 6.4*  ALBUMIN 3.6   No results for input(s): LIPASE, AMYLASE in the last 168 hours. Recent Labs  Lab 05/21/17 0225  AMMONIA 13    CBC: Recent Labs  Lab 05/20/17 1220  WBC 7.2  NEUTROABS 5.5  HGB 12.1  HCT 37.3  MCV 87.4  PLT 230    Cardiac Enzymes: Recent Labs  Lab 05/20/17 1220 05/21/17 0225 05/21/17 0700  TROPONINI <0.03 <0.03 <0.03    BNP: Invalid input(s): POCBNP  CBG: No results for input(s): GLUCAP in the last 168 hours.  Microbiology: Results for orders placed or performed during the hospital encounter of 05/20/17  MRSA PCR Screening     Status: None   Collection Time: 05/20/17 11:50 PM  Result Value Ref Range Status   MRSA by PCR NEGATIVE NEGATIVE Final    Comment:        The GeneXpert MRSA Assay (FDA approved for NASAL specimens only), is one component of a comprehensive MRSA colonization surveillance program. It is not intended to diagnose MRSA infection nor to guide or monitor treatment for MRSA  infections.     Coagulation Studies: No results for input(s): LABPROT, INR in the last 72 hours.  Urinalysis:  Recent Labs  Lab 05/20/17 1340  COLORURINE YELLOW  LABSPEC 1.025  PHURINE 6.0  GLUCOSEU NEGATIVE  HGBUR NEGATIVE  BILIRUBINUR NEGATIVE  KETONESUR NEGATIVE  PROTEINUR 100*  NITRITE NEGATIVE  LEUKOCYTESUR SMALL*    Lipid Panel:  No results found for: CHOL, TRIG, HDL, CHOLHDL, VLDL, LDLCALC  HgbA1C: No results found for: HGBA1C  Urine Drug Screen:  No results found for: LABOPIA, COCAINSCRNUR, LABBENZ, AMPHETMU, THCU, LABBARB  Alcohol Level: No results for input(s): ETH in the last 168 hours.  Other results: EKG: Sinus bradycardia rate 42 bpm with 2-1 heart block.  Imaging:   Ct Head Wo Contrast 05/20/2017 IMPRESSION:  No acute intracranial abnormality and stable non contrast CT appearance of the brain since March 2018. Cerebral volume loss with ex vacuo appearing ventricular enlargement, and nonspecific frontal horn region chronic white matter changes.   Assessment/Plan: 83 year old female with a one week history of confusion. The confusion was of abrupt  onset per her son.  1. CT shows diffuse central and superficial cerebral atrophy, including hippocampal atrophy. The findings are not diagnostic of a dementia, but certainly there would be an increased likelihood of a degenerative dementia given the degree of atrophy. No waxing/waning component, tremor, asterixis or agitation to suggest a delirium.  It is possible that the patient had an underlying dementia which was exaserbated by the stress of the bradycardia, hypertension, and possible UTI. She may also have hypertensive encephalopathy.  2. An MRI has been ordered and should be performed shortly.  3. Cardiology plans to place a permanent pacemaker today, following MRI.  4. The plan was discussed in detail with the patient's son and all of his questions were answered. 5. Discussed with Dr. Ladona Ridgel.  Delton See PA-C [facility] Pager [phone] 05/21/2017, 2:07 PM  Electronically signed: Dr. Caryl Pina

## 2017-05-21 NOTE — Progress Notes (Signed)
  Echocardiogram 2D Echocardiogram has been performed.  Nicole [name] 05/21/2017, 1:34 PM

## 2017-05-21 NOTE — Progress Notes (Signed)
PROGRESS NOTE    Nicole [name]  ZOX:[id number] DOB: November 26, 1928 DOA: 05/20/2017 PCP: System, Pcp Not In    Brief Narrative: Nicole [name] is a 83 y.o. female with medical history significant for hypertension, now presenting to the emergency department for evaluation of confusion.  History was obtained through family, discussion with ED personnel, and review of the EMR.  She has reportedly been having some difficulties with confusion for a year that has not previously been evaluated, but her son reports that this has worsened significantly in the past couple days.  Patient is not expressing any specific complaints.  There was no recent fall or trauma and she is not known to use illicit substances or alcohol.  She has not had a PCP and her medications ran out in March 2018.  [facility] ED Course: Upon arrival to the ED, patient is found to be afebrile, saturating well on room air, bradycardic in the 40s, and hypertensive with systolic pressures in the 200s.  EKG features a bradycardia with rate 42 and apparent Mobitz type II.  Noncontrast head CT is negative for acute intracranial abnormality.  Chemistry panel and CBC are unremarkable and troponin is undetectable.  Urine is not particularly suggestive of infection and sample was sent for culture.  Patient was treated with Norvasc and lisinopril in the emergency department and given prn doses of hydralazine.  Blood pressure improved, heart rate remained in the 40s, and there has not been any apparent respiratory distress.  Cardiology was consulted by the ED physician and [facility].  [facility] and management of heart block, hypertensive urgency, and confusion.     Assessment & Plan:   Principal Problem:   Heart block Active Problems:   Hypertensive urgency   Memory deficit   Acute  encephalopathy   Mobitz type 2 second degree heart block  1-Bradycardia, Mobitz type 2 block.  TSH normal.  ECHO ordered.  Mg normal.  Replete k.  Cardiology consulted.   2-Acute encephalopathy;  Unclear if related to malignant HTN, vs worsening dementia vs stroke.  Check MRI/  Neurology consulted.  B 12 normal.   3- Hypertension with hypertensive urgency  Continue with hydralazine PRN.  Norvasc and lisinopril.     DVT prophylaxis: Lovenox Code Status: full code.  Family Communication: no family at bedside Disposition Plan: remain inpatient.    Consultants:   Cardiology  Neurology    Procedures:   ECHO   Antimicrobials: none   Subjective: She is alert, she answer some questions, appears to have some expressive aphasia/   Objective: Vitals:   05/21/17 0613 05/21/17 0747 05/21/17 1056 05/21/17 1131  BP: (!) 179/40 (!) 160/142 (!) 146/41 (!) 174/40  Pulse:  (!) 48  (!) 46  Resp: 13 16  16   Temp:  99.1 F (37.3 C)  98.3 F (36.8 C)  TempSrc:  Oral  Oral  SpO2:  96%  96%  Weight:        Intake/Output Summary (Last 24 hours) at 05/21/2017 1156 Last data filed at 05/21/2017 0900 Gross per 24 hour  Intake 643.33 ml  Output 600 ml  Net 43.33 ml   Filed Weights   05/20/17 2302 05/21/17 0325  Weight: 59.7 kg (131 lb 9.6 oz) 59.4 kg (131 lb)    Examination:  General exam: Appears calm and comfortable  Respiratory system: Clear to auscultation. Respiratory effort  normal. Cardiovascular system: S1 & S2 heard, RRR. No JVD, murmurs, rubs, gallops or clicks. No pedal edema. Gastrointestinal system: Abdomen is nondistended, soft and nontender. No organomegaly or masses felt. Normal bowel sounds heard. Central nervous system: Alert follows command, no focal deficit.  Extremities: Symmetric 5 x 5 power. Skin: No rashes, lesions or ulcers    Data Reviewed: I have personally reviewed following labs and imaging studies  CBC: Recent Labs  Lab  05/20/17 1220  WBC 7.2  NEUTROABS 5.5  HGB 12.1  HCT 37.3  MCV 87.4  PLT 230   Basic Metabolic Panel: Recent Labs  Lab 05/20/17 1220 05/21/17 0225  NA 142 143  K 3.6 3.4*  CL 108 109  CO2 28 28  GLUCOSE 96 113*  BUN 22* 15  CREATININE 1.01* 0.88  CALCIUM 9.0 9.2  MG  --  2.0   GFR: CrCl cannot be calculated (Unknown ideal weight.). Liver Function Tests: Recent Labs  Lab 05/20/17 1220  AST 14*  ALT 14  ALKPHOS 76  BILITOT 0.5  PROT 6.4*  ALBUMIN 3.6   No results for input(s): LIPASE, AMYLASE in the last 168 hours. Recent Labs  Lab 05/21/17 0225  AMMONIA 13   Coagulation Profile: No results for input(s): INR, PROTIME in the last 168 hours. Cardiac Enzymes: Recent Labs  Lab 05/20/17 1220 05/21/17 0225 05/21/17 0700  TROPONINI <0.03 <0.03 <0.03   BNP (last 3 results) No results for input(s): PROBNP in the last 8760 hours. HbA1C: No results for input(s): HGBA1C in the last 72 hours. CBG: No results for input(s): GLUCAP in the last 168 hours. Lipid Profile: No results for input(s): CHOL, HDL, LDLCALC, TRIG, CHOLHDL, LDLDIRECT in the last 72 hours. Thyroid Function Tests: Recent Labs    05/21/17 0225  TSH 1.246   Anemia Panel: Recent Labs    05/21/17 0225  VITAMINB12 488   Sepsis Labs: No results for input(s): PROCALCITON, LATICACIDVEN in the last 168 hours.  Recent Results (from the past 240 hour(s))  MRSA PCR Screening     Status: None   Collection Time: 05/20/17 11:50 PM  Result Value Ref Range Status   MRSA by PCR NEGATIVE NEGATIVE Final    Comment:        The GeneXpert MRSA Assay (FDA approved for NASAL specimens only), is one component of a comprehensive MRSA colonization surveillance program. It is not intended to diagnose MRSA infection nor to guide or monitor treatment for MRSA infections.          Radiology Studies: Ct Head Wo Contrast  Result Date: 05/20/2017 CLINICAL DATA:  83 year old female with memory loss  symptoms for the past few months, new onset short-term memory changes in the past 4 days. EXAM: CT HEAD WITHOUT CONTRAST TECHNIQUE: Contiguous axial images were obtained from the base of the skull through the vertex without intravenous contrast. COMPARISON:  [facility] head CT without contrast 08/26/2016 FINDINGS: Brain: Stable cerebral volume since March. Ex vacuo appearing mild for age ventricular prominence. No evidence of transependymal edema. No midline shift, mass effect, or evidence of intracranial mass lesion. No acute intracranial hemorrhage identified. Patchy and confluent mostly frontal horn region periventricular white matter hypodensity appears stable. No cortical encephalomalacia identified. No cortically based acute infarct identified. Vascular: Calcified atherosclerosis at the skull base. No suspicious intracranial vascular hyperdensity. Skull: Stable.  No acute osseous abnormality identified. Sinuses/Orbits: Visualized paranasal sinuses and mastoids are stable and well pneumatized. Other: Stable and negative visualized orbit and scalp  soft tissues. IMPRESSION: No acute intracranial abnormality and stable non contrast CT appearance of the brain since March 2018. Cerebral volume loss with ex vacuo appearing ventricular enlargement, and nonspecific frontal horn region chronic white matter changes. Electronically Signed   By: Odessa FlemingH  Hall M.D.   On: 05/20/2017 11:56        Scheduled Meds: . amLODipine  10 mg Oral Daily  . enoxaparin (LOVENOX) injection  40 mg Subcutaneous Q24H  . lisinopril  20 mg Oral Daily  . sodium chloride flush  3 mL Intravenous Q12H   Continuous Infusions:   LOS: 1 day    Time spent: 35 minutes.     Alba CoryBelkys A Letasha Kershaw, MD [facility] Pager [phone]  If 7PM-7AM, please contact night-coverage [email or web address] Password [email or web address] 05/21/2017, 11:56 AM

## 2017-05-22 ENCOUNTER — Inpatient Hospital Stay (HOSPITAL_COMMUNITY): Payer: Medicare Other

## 2017-05-22 ENCOUNTER — Encounter (HOSPITAL_COMMUNITY): Payer: Self-pay | Admitting: Internal Medicine

## 2017-05-22 DIAGNOSIS — Z950 Presence of cardiac pacemaker: Secondary | ICD-10-CM

## 2017-05-22 DIAGNOSIS — I639 Cerebral infarction, unspecified: Secondary | ICD-10-CM

## 2017-05-22 DIAGNOSIS — I10 Essential (primary) hypertension: Secondary | ICD-10-CM

## 2017-05-22 DIAGNOSIS — I6330 Cerebral infarction due to thrombosis of unspecified cerebral artery: Secondary | ICD-10-CM

## 2017-05-22 LAB — BLOOD GAS, ARTERIAL
Acid-Base Excess: 2.2 mmol/L — ABNORMAL HIGH (ref 0.0–2.0)
Bicarbonate: 26.1 mmol/L (ref 20.0–28.0)
DRAWN BY: 313061
FIO2: 21
O2 Saturation: 93.1 %
PATIENT TEMPERATURE: 98.6
PH ART: 7.436 (ref 7.350–7.450)
pCO2 arterial: 39.4 mmHg (ref 32.0–48.0)
pO2, Arterial: 65 mmHg — ABNORMAL LOW (ref 83.0–108.0)

## 2017-05-22 LAB — BASIC METABOLIC PANEL
ANION GAP: 9 (ref 5–15)
BUN: 17 mg/dL (ref 6–20)
CHLORIDE: 108 mmol/L (ref 101–111)
CO2: 22 mmol/L (ref 22–32)
Calcium: 9.1 mg/dL (ref 8.9–10.3)
Creatinine, Ser: 1 mg/dL (ref 0.44–1.00)
GFR, EST AFRICAN AMERICAN: 57 mL/min — AB (ref >60)
GFR, EST NON AFRICAN AMERICAN: 50 mL/min — AB (ref >60)
Glucose, Bld: 105 mg/dL — ABNORMAL HIGH (ref 65–99)
POTASSIUM: 3.6 mmol/L (ref 3.5–5.1)
SODIUM: 139 mmol/L (ref 135–145)

## 2017-05-22 LAB — LIPID PANEL
CHOLESTEROL: 215 mg/dL — AB (ref 0–200)
HDL: 37 mg/dL — ABNORMAL LOW (ref >40)
LDL Cholesterol: 142 mg/dL — ABNORMAL HIGH (ref 0–99)
TRIGLYCERIDES: 179 mg/dL — AB (ref <150)
Total CHOL/HDL Ratio: 5.8 RATIO
VLDL: 36 mg/dL (ref 0–40)

## 2017-05-22 LAB — FOLATE RBC
FOLATE, HEMOLYSATE: 492.8 ng/mL
Folate, RBC: 1273 ng/mL (ref >498)
HEMATOCRIT: 38.7 % (ref 34.0–46.6)

## 2017-05-22 LAB — HEMOGLOBIN A1C
Hgb A1c MFr Bld: 5.7 % — ABNORMAL HIGH (ref 4.8–5.6)
Mean Plasma Glucose: 116.89 mg/dL

## 2017-05-22 LAB — GLUCOSE, CAPILLARY: GLUCOSE-CAPILLARY: 114 mg/dL — AB (ref 65–99)

## 2017-05-22 MED ORDER — ENOXAPARIN SODIUM 40 MG/0.4ML ~~LOC~~ SOLN: 40.0000 mg | SUBCUTANEOUS | Status: DC

## 2017-05-22 MED ORDER — PRAVASTATIN SODIUM 40 MG PO TABS
40.0000 mg | ORAL_TABLET | Freq: Every day | ORAL | Status: DC
  Administered 2017-05-22 – 2017-05-26 (×5): 40 mg via ORAL
  Filled 2017-05-22 (×5): qty 1

## 2017-05-22 MED ORDER — ASPIRIN 81 MG PO CHEW
81.0000 mg | CHEWABLE_TABLET | Freq: Every day | ORAL | Status: DC
  Administered 2017-05-22 – 2017-05-27 (×6): 81 mg via ORAL
  Filled 2017-05-22 (×6): qty 1

## 2017-05-22 MED ORDER — SODIUM CHLORIDE 0.9 % IV BOLUS (SEPSIS)
500.0000 mL | Freq: Once | INTRAVENOUS | Status: AC
  Administered 2017-05-22: 500 mL via INTRAVENOUS

## 2017-05-22 MED ORDER — SODIUM CHLORIDE 0.9 % IV SOLN
INTRAVENOUS | Status: DC
  Administered 2017-05-22: 18:00:00 via INTRAVENOUS
  Administered 2017-05-23: 100 mL/h via INTRAVENOUS
  Administered 2017-05-24: 09:00:00 via INTRAVENOUS
  Administered 2017-05-24: 100 mL/h via INTRAVENOUS
  Administered 2017-05-25 – 2017-05-26 (×3): via INTRAVENOUS

## 2017-05-22 MED ORDER — HYDRALAZINE HCL 20 MG/ML IJ SOLN
5.0000 mg | INTRAMUSCULAR | Status: DC | PRN
  Administered 2017-05-26: 5 mg via INTRAVENOUS
  Filled 2017-05-22: qty 1

## 2017-05-22 MED ORDER — AMLODIPINE BESYLATE 5 MG PO TABS: 5.0000 mg | ORAL_TABLET | Freq: Every day | ORAL | Status: DC

## 2017-05-22 MED FILL — Gentamicin Sulfate Inj 40 MG/ML: INTRAMUSCULAR | Qty: 80 | Status: AC

## 2017-05-22 MED FILL — Promethazine HCl Inj 25 MG/ML: INTRAMUSCULAR | Qty: 1 | Status: AC

## 2017-05-22 NOTE — Progress Notes (Signed)
PROGRESS NOTE    Nicole [name]  ONG:[id number] DOB: Oct 31, 1928 DOA: 05/20/2017 PCP: System, Pcp Not In    Brief Narrative: Nicole [name] is a 83 y.o. female with medical history significant for hypertension, now presenting to the emergency department for evaluation of confusion.  History was obtained through family, discussion with ED personnel, and review of the EMR.  She has reportedly been having some difficulties with confusion for a year that has not previously been evaluated, but her son reports that this has worsened significantly in the past couple days.  Patient is not expressing any specific complaints.  There was no recent fall or trauma and she is not known to use illicit substances or alcohol.  She has not had a PCP and her medications ran out in March 2018.  [facility] ED Course: Upon arrival to the ED, patient is found to be afebrile, saturating well on room air, bradycardic in the 40s, and hypertensive with systolic pressures in the 200s.  EKG features a bradycardia with rate 42 and apparent Mobitz type II.  Noncontrast head CT is negative for acute intracranial abnormality.  Chemistry panel and CBC are unremarkable and troponin is undetectable.  Urine is not particularly suggestive of infection and sample was sent for culture.  Patient was treated with Norvasc and lisinopril in the emergency department and given prn doses of hydralazine.  Blood pressure improved, heart rate remained in the 40s, and there has not been any apparent respiratory distress.  Cardiology was consulted by the ED physician and recommended medical admission to [facility] for possible pacemaker.  Will be admitted to the stepdown unit and [facility] for ongoing evaluation and management of heart block, hypertensive urgency, and confusion.     Assessment & Plan:   Principal Problem:   Heart block Active Problems:   Hypertensive urgency   Memory deficit   Acute  encephalopathy   Mobitz type 2 second degree heart block   Cerebral thrombosis with cerebral infarction  1-Bradycardia, Mobitz type 2 block.  TSH normal.  ECHO EF 60 % diastolic dysfunction grade 2.  Mg normal.  Cardiology consulted. Patient was not able to tolerates pacemake implantation yesterday. She will need general anesthesia. Also family thinking about it.   2-Acute thalamic stroke.  Acute encephalopathy MRI positive for thalamic stroke.  Neurology consulted. Started on aspirin and statins.  B 12 normal. LDL 142.  Worsening Mental status this afternoon, she is more lethargic, keep eyes close, say few words.  Will check Stat CT head, ABG. CBG was normal./ I will give IV bolus.  Neurology and cardiology informed of change in patient condition.   3- Hypertension with hypertensive urgency  Continue with hydralazine PRN.  Norvasc and lisinopril.  Decreased Norvasc to avoid further drop in blood pressure.    DVT prophylaxis: Lovenox Code Status: full code.  Family Communication: both sons at bedside.  Disposition Plan: remain inpatient.    Consultants:   Cardiology  Neurology    Procedures:   ECHO   Antimicrobials: none   Subjective: She was alert this morning, saying few words. She was speaking in her primary language.     Objective: Vitals:   05/22/17 0740 05/22/17 0830 05/22/17 0919 05/22/17 1124  BP: (!) 178/49  (!) 166/51 (!) 159/44  Pulse: (!) 50   (!) 49  Resp: 18 19 14  (!) 23  Temp: 98.6 F (37 C)   98.1 F (36.7 C)  TempSrc: Oral   Axillary  SpO2: 94%   93%  Weight:        Intake/Output Summary (Last 24 hours) at 05/22/2017 1232 Last data filed at 05/22/2017 0900 Gross per 24 hour  Intake 103 ml  Output 300 ml  Net -197 ml   Filed Weights   05/20/17 2302 05/21/17 0325  Weight: 59.7 kg (131 lb 9.6 oz) 59.4 kg (131 lb)    Examination:  General exam: Lethargic.  Respiratory system: CTA,  Cardiovascular system: S1 & S2 heard, RRR.  No JVD, murmurs, rubs, gallops or clicks. No pedal edema. Gastrointestinal system: Abdomen is nondistended, soft and nontender. No organomegaly or masses felt. Normal bowel sounds heard. Central nervous system:  Lethargic, keep eyes close, say few words. Upper extremities ; resist my assessment, moves Bilateral Lower extremities.  Extremities: no edema.  Skin: No rashes, lesions or ulcers    Data Reviewed: I have personally reviewed following labs and imaging studies  CBC: Recent Labs  Lab 05/20/17 1220 05/21/17 1922  WBC 7.2 14.2*  NEUTROABS 5.5  --   HGB 12.1 13.2  HCT 37.3 41.0  MCV 87.4 87.8  PLT 230 213   Basic Metabolic Panel: Recent Labs  Lab 05/20/17 1220 05/21/17 0225 05/21/17 1922  NA 142 143  --   K 3.6 3.4*  --   CL 108 109  --   CO2 28 28  --   GLUCOSE 96 113*  --   BUN 22* 15  --   CREATININE 1.01* 0.88 0.92  CALCIUM 9.0 9.2  --   MG  --  2.0  --    GFR: CrCl cannot be calculated (Unknown ideal weight.). Liver Function Tests: Recent Labs  Lab 05/20/17 1220  AST 14*  ALT 14  ALKPHOS 76  BILITOT 0.5  PROT 6.4*  ALBUMIN 3.6   No results for input(s): LIPASE, AMYLASE in the last 168 hours. Recent Labs  Lab 05/21/17 0225  AMMONIA 13   Coagulation Profile: No results for input(s): INR, PROTIME in the last 168 hours. Cardiac Enzymes: Recent Labs  Lab 05/20/17 1220 05/21/17 0225 05/21/17 0700 05/21/17 1414  TROPONINI <0.03 <0.03 <0.03 0.03*   BNP (last 3 results) No results for input(s): PROBNP in the last 8760 hours. HbA1C: Recent Labs    05/22/17 0900  HGBA1C 5.7*   CBG: No results for input(s): GLUCAP in the last 168 hours. Lipid Profile: Recent Labs    05/22/17 0842  CHOL 215*  HDL 37*  LDLCALC 142*  TRIG 179*  CHOLHDL 5.8   Thyroid Function Tests: Recent Labs    05/21/17 0225  TSH 1.246   Anemia Panel: Recent Labs    05/21/17 0225  VITAMINB12 488   Sepsis Labs: No results for input(s): PROCALCITON,  LATICACIDVEN in the last 168 hours.  Recent Results (from the past 240 hour(s))  Urine culture     Status: Abnormal   Collection Time: 05/20/17  2:45 PM  Result Value Ref Range Status   Specimen Description URINE, CLEAN CATCH  Final   Special Requests NONE  Final   Culture MULTIPLE SPECIES PRESENT, SUGGEST RECOLLECTION (A)  Final   Report Status 05/21/2017 FINAL  Final  MRSA PCR Screening     Status: None   Collection Time: 05/20/17 11:50 PM  Result Value Ref Range Status   MRSA by PCR NEGATIVE NEGATIVE Final    Comment:        The GeneXpert MRSA Assay (FDA approved for NASAL specimens only), is one component of a  comprehensive MRSA colonization surveillance program. It is not intended to diagnose MRSA infection nor to guide or monitor treatment for MRSA infections.   Surgical PCR screen     Status: Abnormal   Collection Time: 05/21/17  2:24 PM  Result Value Ref Range Status   MRSA, PCR NEGATIVE NEGATIVE Final   Staphylococcus aureus POSITIVE (A) NEGATIVE Final    Comment: (NOTE) The Xpert SA Assay (FDA approved for NASAL specimens in patients 45 years of age and older), is one component of a comprehensive surveillance program. It is not intended to diagnose infection nor to guide or monitor treatment.          Radiology Studies: Mr Shirlee Latch ZO Contrast  Result Date: 05/21/2017 CLINICAL DATA:  83 year old female with recent unexplained confusion. EXAM: MRI HEAD WITHOUT CONTRAST MRA HEAD WITHOUT CONTRAST TECHNIQUE: Multiplanar, multiecho pulse sequences of the brain and surrounding structures were obtained without intravenous contrast. Angiographic images of the head were obtained using MRA technique without contrast. COMPARISON:  Head CT without contrast 05/20/2017, 08/26/2016. FINDINGS: MRI HEAD FINDINGS Brain: There is a small 8-9 mm area of nodular restricted diffusion along the anterior left thalamus near the genu of the left internal capsule (series 3, image 24).  There is associated T2 and FLAIR hyperintensity. No associated hemorrhage. No significant mass effect. No other restricted diffusion. Moderate bilateral superimposed deep gray matter nuclei T2 heterogeneity, although perhaps in large part due to prominent perivascular spaces. Confluent bilateral cerebral white matter T2 and FLAIR hyperintensity. No cortical encephalomalacia identified. No definite chronic cerebral blood products. The brainstem and cerebellum are normal aside from volume loss. No midline shift, mass effect, evidence of mass lesion, ventriculomegaly, extra-axial collection or acute intracranial hemorrhage. Cervicomedullary junction and pituitary are within normal limits. Vascular: Major intracranial vascular flow voids are preserved. Skull and upper cervical spine: Negative visualized cervical spine. Visualized bone marrow signal is within normal limits. Sinuses/Orbits: Postoperative changes to both globes. Otherwise negative orbits soft tissues. Paranasal sinuses and mastoids are stable and well pneumatized. Other: Visible internal auditory structures appear normal. Scalp and face soft tissues appear negative. MRA HEAD FINDINGS Antegrade flow in the posterior circulation. Dominant appearing distal right vertebral artery. Patent left PICA origin. Patent vertebrobasilar junction and basilar artery without stenosis. Patent dominant appearing right AICA origin. Patent SCA and PCA origins. Posterior communicating arteries are diminutive or absent. There is mild bilateral P1 segment irregularity, but there is severe stenosis of the left PCA P2 segment affecting a 3-4 mm portion of the vessel (series 1153 image 6). Fairly symmetric appearing distal left PCA flow signal despite this lesion. Distal right PCA branches are within normal limits. Antegrade flow in both ICA siphons. Moderate siphon irregularity greater on the left. But only mild multifocal left siphon stenosis (pre cavernous segment and  supraclinoid segment). No right siphon stenosis. Patent carotid termini. Normal ophthalmic artery origins. Normal MCA and ACA origins. Tortuous A1 segments. Anterior communicating artery and proximal A2 segments are normal. There is moderate to severe bilateral distal ACA A2 segment stenosis with preserved distal flow (series 1155, image 3). Both MCA M1 segments and MCA bifurcations are patent without stenosis. Visible right MCA branches are within normal limits. Visible left MCA branches demonstrate mild irregularity. IMPRESSION: 1. Acute lacunar infarct of the anterior Left Thalamus. No hemorrhage or mass effect. 2. Associated severe Left PCA stenosis, perhaps with acute involvement of the Left Thalamostriate Artery in light of #1. 3. Intracranial MRA also remarkable for moderate to  severe bilateral ACA A2 segment stenosis. 4. No other acute intracranial abnormality. Other deep gray matter nuclei signal changes and widespread cerebral white matter signal changes most commonly due to chronic small vessel disease. Electronically Signed   By: Odessa Fleming M.D.   On: 05/21/2017 16:34   Mr Brain Wo Contrast  Result Date: 05/21/2017 CLINICAL DATA:  83 year old female with recent unexplained confusion. EXAM: MRI HEAD WITHOUT CONTRAST MRA HEAD WITHOUT CONTRAST TECHNIQUE: Multiplanar, multiecho pulse sequences of the brain and surrounding structures were obtained without intravenous contrast. Angiographic images of the head were obtained using MRA technique without contrast. COMPARISON:  Head CT without contrast 05/20/2017, 08/26/2016. FINDINGS: MRI HEAD FINDINGS Brain: There is a small 8-9 mm area of nodular restricted diffusion along the anterior left thalamus near the genu of the left internal capsule (series 3, image 24). There is associated T2 and FLAIR hyperintensity. No associated hemorrhage. No significant mass effect. No other restricted diffusion. Moderate bilateral superimposed deep gray matter nuclei T2  heterogeneity, although perhaps in large part due to prominent perivascular spaces. Confluent bilateral cerebral white matter T2 and FLAIR hyperintensity. No cortical encephalomalacia identified. No definite chronic cerebral blood products. The brainstem and cerebellum are normal aside from volume loss. No midline shift, mass effect, evidence of mass lesion, ventriculomegaly, extra-axial collection or acute intracranial hemorrhage. Cervicomedullary junction and pituitary are within normal limits. Vascular: Major intracranial vascular flow voids are preserved. Skull and upper cervical spine: Negative visualized cervical spine. Visualized bone marrow signal is within normal limits. Sinuses/Orbits: Postoperative changes to both globes. Otherwise negative orbits soft tissues. Paranasal sinuses and mastoids are stable and well pneumatized. Other: Visible internal auditory structures appear normal. Scalp and face soft tissues appear negative. MRA HEAD FINDINGS Antegrade flow in the posterior circulation. Dominant appearing distal right vertebral artery. Patent left PICA origin. Patent vertebrobasilar junction and basilar artery without stenosis. Patent dominant appearing right AICA origin. Patent SCA and PCA origins. Posterior communicating arteries are diminutive or absent. There is mild bilateral P1 segment irregularity, but there is severe stenosis of the left PCA P2 segment affecting a 3-4 mm portion of the vessel (series 1153 image 6). Fairly symmetric appearing distal left PCA flow signal despite this lesion. Distal right PCA branches are within normal limits. Antegrade flow in both ICA siphons. Moderate siphon irregularity greater on the left. But only mild multifocal left siphon stenosis (pre cavernous segment and supraclinoid segment). No right siphon stenosis. Patent carotid termini. Normal ophthalmic artery origins. Normal MCA and ACA origins. Tortuous A1 segments. Anterior communicating artery and proximal A2  segments are normal. There is moderate to severe bilateral distal ACA A2 segment stenosis with preserved distal flow (series 1155, image 3). Both MCA M1 segments and MCA bifurcations are patent without stenosis. Visible right MCA branches are within normal limits. Visible left MCA branches demonstrate mild irregularity. IMPRESSION: 1. Acute lacunar infarct of the anterior Left Thalamus. No hemorrhage or mass effect. 2. Associated severe Left PCA stenosis, perhaps with acute involvement of the Left Thalamostriate Artery in light of #1. 3. Intracranial MRA also remarkable for moderate to severe bilateral ACA A2 segment stenosis. 4. No other acute intracranial abnormality. Other deep gray matter nuclei signal changes and widespread cerebral white matter signal changes most commonly due to chronic small vessel disease. Electronically Signed   By: Odessa Fleming M.D.   On: 05/21/2017 16:34   Dg Chest Port 1 View  Result Date: 05/21/2017 CLINICAL DATA:  Attempted pacemaker placement. EXAM: PORTABLE  CHEST 1 VIEW COMPARISON:  None. FINDINGS: Suboptimal inspiration accounts for crowded bronchovascular markings, especially in the bases, and accentuates the cardiac silhouette. Taking this into account, cardiac silhouette upper normal in size to slightly enlarged. Mild central peribronchial thickening. Lungs otherwise clear. Normal pulmonary vascularity. No pleural effusions. No pneumothorax. IMPRESSION: 1. Suboptimal inspiration. Mild changes of bronchitis and/or asthma without focal airspace pneumonia. 2. No pneumothorax. Electronically Signed   By: Hulan Saashomas  Lawrence M.D.   On: 05/21/2017 20:49        Scheduled Meds: . amLODipine  10 mg Oral Daily  . aspirin  81 mg Oral Daily  . Chlorhexidine Gluconate Cloth  6 each Topical Daily  . enoxaparin (LOVENOX) injection  40 mg Subcutaneous Q24H  . lisinopril  20 mg Oral Daily  . mupirocin ointment  1 application Nasal BID  . sodium chloride flush  3 mL Intravenous Q12H    Continuous Infusions:   LOS: 2 days    Time spent: 35 minutes.     Alba CoryBelkys A Micajah Dennin, MD [facility] Pager [phone]  If 7PM-7AM, please contact night-coverage [email or web address] Password [email or web address] 05/22/2017, 12:32 PM

## 2017-05-22 NOTE — Progress Notes (Signed)
Progress Note  Patient Name: Nicole Birkrene Lasecki Date of Encounter: 05/22/2017  Primary Cardiologist: New  Subjective   C/o HA. Denies chest pain or sob  Inpatient Medications    Scheduled Meds: . amLODipine  10 mg Oral Daily  . Chlorhexidine Gluconate Cloth  6 each Topical Daily  . enoxaparin (LOVENOX) injection  40 mg Subcutaneous Q24H  . lisinopril  20 mg Oral Daily  . mupirocin ointment  1 application Nasal BID  . sodium chloride flush  3 mL Intravenous Q12H   Continuous Infusions:  PRN Meds: acetaminophen, hydrALAZINE, HYDROcodone-acetaminophen, ondansetron (ZOFRAN) IV, ondansetron **OR** [DISCONTINUED] ondansetron (ZOFRAN) IV, senna-docusate   Vital Signs    Vitals:   05/22/17 0110 05/22/17 0116 05/22/17 0219 05/22/17 0539  BP: (!) 180/50 (!) 180/50 (!) 161/41 (!) 161/42  Pulse:      Resp: 19  18 18   Temp:      TempSrc:      SpO2:      Weight:        Intake/Output Summary (Last 24 hours) at 05/22/2017 0618 Last data filed at 05/21/2017 2220 Gross per 24 hour  Intake 503 ml  Output -  Net 503 ml   Filed Weights   05/20/17 2302 05/21/17 0325  Weight: 131 lb 9.6 oz (59.7 kg) 131 lb (59.4 kg)    Telemetry    nsr with 2:1 AV block - Personally Reviewed  ECG    none - Personally Reviewed  Physical Exam   GEN: No acute distress.   Neck: No JVD Cardiac: Reg brady, no murmurs, rubs, or gallops.  Respiratory: Clear to auscultation bilaterally. GI: Soft, nontender, non-distended  MS: No edema; No deformity. Neuro:  Nonfocal though still appears to be a little confused Psych: Normal affect   Labs    Chemistry Recent Labs  Lab 05/20/17 1220 05/21/17 0225 05/21/17 1922  NA 142 143  --   K 3.6 3.4*  --   CL 108 109  --   CO2 28 28  --   GLUCOSE 96 113*  --   BUN 22* 15  --   CREATININE 1.01* 0.88 0.92  CALCIUM 9.0 9.2  --   PROT 6.4*  --   --   ALBUMIN 3.6  --   --   AST 14*  --   --   ALT 14  --   --   ALKPHOS 76  --   --   BILITOT  0.5  --   --   GFRNONAA 49* 58* 55*  GFRAA 57* >60 >60  ANIONGAP 6 6  --      Hematology Recent Labs  Lab 05/20/17 1220 05/21/17 1922  WBC 7.2 14.2*  RBC 4.27 4.67  HGB 12.1 13.2  HCT 37.3 41.0  MCV 87.4 87.8  MCH 28.3 28.3  MCHC 32.4 32.2  RDW 14.6 14.9  PLT 230 213    Cardiac Enzymes Recent Labs  Lab 05/20/17 1220 05/21/17 0225 05/21/17 0700 05/21/17 1414  TROPONINI <0.03 <0.03 <0.03 0.03*   No results for input(s): TROPIPOC in the last 168 hours.   BNPNo results for input(s): BNP, PROBNP in the last 168 hours.   DDimer No results for input(s): DDIMER in the last 168 hours.   Radiology    Ct Head Wo Contrast  Result Date: 05/20/2017 CLINICAL DATA:  83 year old female with memory loss symptoms for the past few months, new onset short-term memory changes in the past 4 days. EXAM: CT HEAD WITHOUT CONTRAST TECHNIQUE:  Contiguous axial images were obtained from the base of the skull through the vertex without intravenous contrast. COMPARISON:  High [facility] head CT without contrast 08/26/2016 FINDINGS: Brain: Stable cerebral volume since March. Ex vacuo appearing mild for age ventricular prominence. No evidence of transependymal edema. No midline shift, mass effect, or evidence of intracranial mass lesion. No acute intracranial hemorrhage identified. Patchy and confluent mostly frontal horn region periventricular white matter hypodensity appears stable. No cortical encephalomalacia identified. No cortically based acute infarct identified. Vascular: Calcified atherosclerosis at the skull base. No suspicious intracranial vascular hyperdensity. Skull: Stable.  No acute osseous abnormality identified. Sinuses/Orbits: Visualized paranasal sinuses and mastoids are stable and well pneumatized. Other: Stable and negative visualized orbit and scalp soft tissues. IMPRESSION: No acute intracranial abnormality and stable non contrast CT appearance of the brain since March  2018. Cerebral volume loss with ex vacuo appearing ventricular enlargement, and nonspecific frontal horn region chronic white matter changes. Electronically Signed   By: Odessa Fleming M.D.   On: 05/20/2017 11:56   Mr Shirlee Latch ZO Contrast  Result Date: 05/21/2017 CLINICAL DATA:  83 year old female with recent unexplained confusion. EXAM: MRI HEAD WITHOUT CONTRAST MRA HEAD WITHOUT CONTRAST TECHNIQUE: Multiplanar, multiecho pulse sequences of the brain and surrounding structures were obtained without intravenous contrast. Angiographic images of the head were obtained using MRA technique without contrast. COMPARISON:  Head CT without contrast 05/20/2017, 08/26/2016. FINDINGS: MRI HEAD FINDINGS Brain: There is a small 8-9 mm area of nodular restricted diffusion along the anterior left thalamus near the genu of the left internal capsule (series 3, image 24). There is associated T2 and FLAIR hyperintensity. No associated hemorrhage. No significant mass effect. No other restricted diffusion. Moderate bilateral superimposed deep gray matter nuclei T2 heterogeneity, although perhaps in large part due to prominent perivascular spaces. Confluent bilateral cerebral white matter T2 and FLAIR hyperintensity. No cortical encephalomalacia identified. No definite chronic cerebral blood products. The brainstem and cerebellum are normal aside from volume loss. No midline shift, mass effect, evidence of mass lesion, ventriculomegaly, extra-axial collection or acute intracranial hemorrhage. Cervicomedullary junction and pituitary are within normal limits. Vascular: Major intracranial vascular flow voids are preserved. Skull and upper cervical spine: Negative visualized cervical spine. Visualized bone marrow signal is within normal limits. Sinuses/Orbits: Postoperative changes to both globes. Otherwise negative orbits soft tissues. Paranasal sinuses and mastoids are stable and well pneumatized. Other: Visible internal auditory structures  appear normal. Scalp and face soft tissues appear negative. MRA HEAD FINDINGS Antegrade flow in the posterior circulation. Dominant appearing distal right vertebral artery. Patent left PICA origin. Patent vertebrobasilar junction and basilar artery without stenosis. Patent dominant appearing right AICA origin. Patent SCA and PCA origins. Posterior communicating arteries are diminutive or absent. There is mild bilateral P1 segment irregularity, but there is severe stenosis of the left PCA P2 segment affecting a 3-4 mm portion of the vessel (series 1153 image 6). Fairly symmetric appearing distal left PCA flow signal despite this lesion. Distal right PCA branches are within normal limits. Antegrade flow in both ICA siphons. Moderate siphon irregularity greater on the left. But only mild multifocal left siphon stenosis (pre cavernous segment and supraclinoid segment). No right siphon stenosis. Patent carotid termini. Normal ophthalmic artery origins. Normal MCA and ACA origins. Tortuous A1 segments. Anterior communicating artery and proximal A2 segments are normal. There is moderate to severe bilateral distal ACA A2 segment stenosis with preserved distal flow (series 1155, image 3). Both MCA M1 segments and  MCA bifurcations are patent without stenosis. Visible right MCA branches are within normal limits. Visible left MCA branches demonstrate mild irregularity. IMPRESSION: 1. Acute lacunar infarct of the anterior Left Thalamus. No hemorrhage or mass effect. 2. Associated severe Left PCA stenosis, perhaps with acute involvement of the Left Thalamostriate Artery in light of #1. 3. Intracranial MRA also remarkable for moderate to severe bilateral ACA A2 segment stenosis. 4. No other acute intracranial abnormality. Other deep gray matter nuclei signal changes and widespread cerebral white matter signal changes most commonly due to chronic small vessel disease. Electronically Signed   By: Odessa FlemingH  Hall M.D.   On: 05/21/2017 16:34     Mr Brain Wo Contrast  Result Date: 05/21/2017 CLINICAL DATA:  83 year old female with recent unexplained confusion. EXAM: MRI HEAD WITHOUT CONTRAST MRA HEAD WITHOUT CONTRAST TECHNIQUE: Multiplanar, multiecho pulse sequences of the brain and surrounding structures were obtained without intravenous contrast. Angiographic images of the head were obtained using MRA technique without contrast. COMPARISON:  Head CT without contrast 05/20/2017, 08/26/2016. FINDINGS: MRI HEAD FINDINGS Brain: There is a small 8-9 mm area of nodular restricted diffusion along the anterior left thalamus near the genu of the left internal capsule (series 3, image 24). There is associated T2 and FLAIR hyperintensity. No associated hemorrhage. No significant mass effect. No other restricted diffusion. Moderate bilateral superimposed deep gray matter nuclei T2 heterogeneity, although perhaps in large part due to prominent perivascular spaces. Confluent bilateral cerebral white matter T2 and FLAIR hyperintensity. No cortical encephalomalacia identified. No definite chronic cerebral blood products. The brainstem and cerebellum are normal aside from volume loss. No midline shift, mass effect, evidence of mass lesion, ventriculomegaly, extra-axial collection or acute intracranial hemorrhage. Cervicomedullary junction and pituitary are within normal limits. Vascular: Major intracranial vascular flow voids are preserved. Skull and upper cervical spine: Negative visualized cervical spine. Visualized bone marrow signal is within normal limits. Sinuses/Orbits: Postoperative changes to both globes. Otherwise negative orbits soft tissues. Paranasal sinuses and mastoids are stable and well pneumatized. Other: Visible internal auditory structures appear normal. Scalp and face soft tissues appear negative. MRA HEAD FINDINGS Antegrade flow in the posterior circulation. Dominant appearing distal right vertebral artery. Patent left PICA origin. Patent  vertebrobasilar junction and basilar artery without stenosis. Patent dominant appearing right AICA origin. Patent SCA and PCA origins. Posterior communicating arteries are diminutive or absent. There is mild bilateral P1 segment irregularity, but there is severe stenosis of the left PCA P2 segment affecting a 3-4 mm portion of the vessel (series 1153 image 6). Fairly symmetric appearing distal left PCA flow signal despite this lesion. Distal right PCA branches are within normal limits. Antegrade flow in both ICA siphons. Moderate siphon irregularity greater on the left. But only mild multifocal left siphon stenosis (pre cavernous segment and supraclinoid segment). No right siphon stenosis. Patent carotid termini. Normal ophthalmic artery origins. Normal MCA and ACA origins. Tortuous A1 segments. Anterior communicating artery and proximal A2 segments are normal. There is moderate to severe bilateral distal ACA A2 segment stenosis with preserved distal flow (series 1155, image 3). Both MCA M1 segments and MCA bifurcations are patent without stenosis. Visible right MCA branches are within normal limits. Visible left MCA branches demonstrate mild irregularity. IMPRESSION: 1. Acute lacunar infarct of the anterior Left Thalamus. No hemorrhage or mass effect. 2. Associated severe Left PCA stenosis, perhaps with acute involvement of the Left Thalamostriate Artery in light of #1. 3. Intracranial MRA also remarkable for moderate to severe bilateral ACA  A2 segment stenosis. 4. No other acute intracranial abnormality. Other deep gray matter nuclei signal changes and widespread cerebral white matter signal changes most commonly due to chronic small vessel disease. Electronically Signed   By: Odessa Fleming M.D.   On: 05/21/2017 16:34   Dg Chest Port 1 View  Result Date: 05/21/2017 CLINICAL DATA:  Attempted pacemaker placement. EXAM: PORTABLE CHEST 1 VIEW COMPARISON:  None. FINDINGS: Suboptimal inspiration accounts for crowded  bronchovascular markings, especially in the bases, and accentuates the cardiac silhouette. Taking this into account, cardiac silhouette upper normal in size to slightly enlarged. Mild central peribronchial thickening. Lungs otherwise clear. Normal pulmonary vascularity. No pleural effusions. No pneumothorax. IMPRESSION: 1. Suboptimal inspiration. Mild changes of bronchitis and/or asthma without focal airspace pneumonia. 2. No pneumothorax. Electronically Signed   By: Hulan Saas M.D.   On: 05/21/2017 20:49    Cardiac Studies   none  Patient Profile     83 y.o. female admitted with AMS and found to have 2:1 AV block, s/p attempted PPM  Assessment & Plan    1. Attempted PPM - she would/could not cooperate for her PPM procedure yesterday and we were unable to perform. She had severe nausea and wretching and could not be still enough to maintain access to her central circulation. She would require general anesthesia for the PPM to be accomplished safely. I discussed this with the son and daughter and they are reflecting. Her life does not depend (for now) on a PPM as she still has 2:1 AV conduction and a narrow QRS. At this point I would suggest continued supportive care. No availability for a PPM with anesthesia at this time. Might be possible on Monday. As she is currently sedentary, she could be discharged home and be allowed to return electively if she and her family wish.  2. Acute stroke - MRI scan reviewed. Neuro following. I suspect plavix and or ASA. Unsure what additional studies indicated. ?TEE 3. HTN - her systolic blood pressure is elevated. She will need additional treatment with anti-hypertensive med that does not slow AV conduction.  Dontarious Schaum,M.D.  For questions or updates, please contact [facility] Please consult [email or web address] for contact info under Cardiology/STEMI.      Signed, Lewayne Bunting, MD  05/22/2017, 6:18 AM  Patient ID: Nicole [name], female   DOB:  May 25, 1929, 83 y.o.   MRN: [id number]

## 2017-05-22 NOTE — Significant Event (Signed)
Rapid Response Event Note  Overview: Time Called: 1540 Arrival Time: 1544 Event Type: Neurologic  Initial Focused Assessment: Patient with increased lethargy this afternoon.  Difficult to arouse. Stimulated patient and performed oral care.  Patient moving both extremities and opens eyes occasionally. Pupils equal and reactive. 3/3, 3/3 BP 145/41  SB 46  RR 20  O2 sats 97% CBG 114 Occasionally speaks  "I don't want to" 2 sons at bedside  Interventions: Dr Sunnie Nielsenegalado at bedside ABG done Stat head CT done  Patient a little more responsive.   Plan of Care (if not transferred): Continue Neuro checks.    Event Summary: Name of Physician Notified: Dr Sunnie Nielsenegalado at 1540  Name of Consulting Physician Notified: Dr Roda ShuttersXu at 1545  Outcome: Stayed in room and stabalized  Event End Time: 1700  Marcellina MillinLayton, Sheridan Gettel

## 2017-05-22 NOTE — Progress Notes (Signed)
Called by nurse that pt was sleepy and hard to arouse, changed from this morning. Had stat CT head showed no acute abnormalities. Went to see pt and she was more awake than prior to CT but still not as awake alert as this am. She is able to open eyes on voice and kept them open but obviously lethargic. However, able to tell me her name, age, able to repeat simple sentences, name thumb in [place]. Neuro condition improved but not at her baseline this morning. Nurse stated that she had good sleep last night. Glucose 114, O2 sat normal, ABG PO2 65, PCO2 39.4. Put on nasal cannula.   Etiology of the episode not clear, could be in hospital delirium vs. Seizure vs. Encephalopathy. EEG ordered to rule out seizure. Close monitoring  Marvel PlanJindong Damia Bobrowski, MD PhD Stroke Neurology 05/22/2017 5:21 PM

## 2017-05-22 NOTE — Progress Notes (Signed)
STROKE TEAM PROGRESS NOTE   SUBJECTIVE (INTERVAL HISTORY) Her sons are at the bedside.  She is lying in bed without distress. She still confused and not orientated, no focal neuro deficit. MRI showed left thalamic infarct. Son denies hx of dementia, and stated that her confusion happened sharply around 8 days ago.    OBJECTIVE Temp:  [97.6 F (36.4 C)-99.8 F (37.7 C)] 98.1 F (36.7 C) (12/21 1124) Pulse Rate:  [43-64] 49 (12/21 1124) Cardiac Rhythm: Sinus bradycardia;Heart block (12/21 0845) Resp:  [0-42] 23 (12/21 1124) BP: (159-195)/(39-84) 159/44 (12/21 1124) SpO2:  [0 %-100 %] 93 % (12/21 1124)  No results for input(s): GLUCAP in the last 168 hours. Recent Labs  Lab 05/20/17 1220 05/21/17 0225 05/21/17 1922  NA 142 143  --   K 3.6 3.4*  --   CL 108 109  --   CO2 28 28  --   GLUCOSE 96 113*  --   BUN 22* 15  --   CREATININE 1.01* 0.88 0.92  CALCIUM 9.0 9.2  --   MG  --  2.0  --    Recent Labs  Lab 05/20/17 1220  AST 14*  ALT 14  ALKPHOS 76  BILITOT 0.5  PROT 6.4*  ALBUMIN 3.6   Recent Labs  Lab 05/20/17 1220 05/21/17 1922  WBC 7.2 14.2*  NEUTROABS 5.5  --   HGB 12.1 13.2  HCT 37.3 41.0  MCV 87.4 87.8  PLT 230 213   Recent Labs  Lab 05/20/17 1220 05/21/17 0225 05/21/17 0700 05/21/17 1414  TROPONINI <0.03 <0.03 <0.03 0.03*   No results for input(s): LABPROT, INR in the last 72 hours. Recent Labs    05/20/17 1340  COLORURINE YELLOW  LABSPEC 1.025  PHURINE 6.0  GLUCOSEU NEGATIVE  HGBUR NEGATIVE  BILIRUBINUR NEGATIVE  KETONESUR NEGATIVE  PROTEINUR 100*  NITRITE NEGATIVE  LEUKOCYTESUR SMALL*       Component Value Date/Time   CHOL 215 (H) 05/22/2017 0842   TRIG 179 (H) 05/22/2017 0842   HDL 37 (L) 05/22/2017 0842   CHOLHDL 5.8 05/22/2017 0842   VLDL 36 05/22/2017 0842   LDLCALC 142 (H) 05/22/2017 0842   Lab Results  Component Value Date   HGBA1C 5.7 (H) 05/22/2017   No results found for: LABOPIA, COCAINSCRNUR, LABBENZ, AMPHETMU,  THCU, LABBARB  No results for input(s): ETH in the last 168 hours.  I have personally reviewed the radiological images below and agree with the radiology interpretations.  Ct Head Wo Contrast  Result Date: 05/20/2017 CLINICAL DATA:  83 year old female with memory loss symptoms for the past few months, new onset short-term memory changes in the past 4 days. EXAM: CT HEAD WITHOUT CONTRAST TECHNIQUE: Contiguous axial images were obtained from the base of the skull through the vertex without intravenous contrast. COMPARISON:  [facility] head CT without contrast 08/26/2016 FINDINGS: Brain: Stable cerebral volume since March. Ex vacuo appearing mild for age ventricular prominence. No evidence of transependymal edema. No midline shift, mass effect, or evidence of intracranial mass lesion. No acute intracranial hemorrhage identified. Patchy and confluent mostly frontal horn region periventricular white matter hypodensity appears stable. No cortical encephalomalacia identified. No cortically based acute infarct identified. Vascular: Calcified atherosclerosis at the skull base. No suspicious intracranial vascular hyperdensity. Skull: Stable.  No acute osseous abnormality identified. Sinuses/Orbits: Visualized paranasal sinuses and mastoids are stable and well pneumatized. Other: Stable and negative visualized orbit and scalp soft tissues. IMPRESSION: No acute intracranial abnormality and stable non  contrast CT appearance of the brain since March 2018. Cerebral volume loss with ex vacuo appearing ventricular enlargement, and nonspecific frontal horn region chronic white matter changes. Electronically Signed   By: Odessa FlemingH  Hall M.D.   On: 05/20/2017 11:56   Mr Shirlee LatchMra Head ZOWo Contrast  Result Date: 05/21/2017 CLINICAL DATA:  83 year old female with recent unexplained confusion. EXAM: MRI HEAD WITHOUT CONTRAST MRA HEAD WITHOUT CONTRAST TECHNIQUE: Multiplanar, multiecho pulse sequences of the brain and  surrounding structures were obtained without intravenous contrast. Angiographic images of the head were obtained using MRA technique without contrast. COMPARISON:  Head CT without contrast 05/20/2017, 08/26/2016. FINDINGS: MRI HEAD FINDINGS Brain: There is a small 8-9 mm area of nodular restricted diffusion along the anterior left thalamus near the genu of the left internal capsule (series 3, image 24). There is associated T2 and FLAIR hyperintensity. No associated hemorrhage. No significant mass effect. No other restricted diffusion. Moderate bilateral superimposed deep gray matter nuclei T2 heterogeneity, although perhaps in large part due to prominent perivascular spaces. Confluent bilateral cerebral white matter T2 and FLAIR hyperintensity. No cortical encephalomalacia identified. No definite chronic cerebral blood products. The brainstem and cerebellum are normal aside from volume loss. No midline shift, mass effect, evidence of mass lesion, ventriculomegaly, extra-axial collection or acute intracranial hemorrhage. Cervicomedullary junction and pituitary are within normal limits. Vascular: Major intracranial vascular flow voids are preserved. Skull and upper cervical spine: Negative visualized cervical spine. Visualized bone marrow signal is within normal limits. Sinuses/Orbits: Postoperative changes to both globes. Otherwise negative orbits soft tissues. Paranasal sinuses and mastoids are stable and well pneumatized. Other: Visible internal auditory structures appear normal. Scalp and face soft tissues appear negative. MRA HEAD FINDINGS Antegrade flow in the posterior circulation. Dominant appearing distal right vertebral artery. Patent left PICA origin. Patent vertebrobasilar junction and basilar artery without stenosis. Patent dominant appearing right AICA origin. Patent SCA and PCA origins. Posterior communicating arteries are diminutive or absent. There is mild bilateral P1 segment irregularity, but there  is severe stenosis of the left PCA P2 segment affecting a 3-4 mm portion of the vessel (series 1153 image 6). Fairly symmetric appearing distal left PCA flow signal despite this lesion. Distal right PCA branches are within normal limits. Antegrade flow in both ICA siphons. Moderate siphon irregularity greater on the left. But only mild multifocal left siphon stenosis (pre cavernous segment and supraclinoid segment). No right siphon stenosis. Patent carotid termini. Normal ophthalmic artery origins. Normal MCA and ACA origins. Tortuous A1 segments. Anterior communicating artery and proximal A2 segments are normal. There is moderate to severe bilateral distal ACA A2 segment stenosis with preserved distal flow (series 1155, image 3). Both MCA M1 segments and MCA bifurcations are patent without stenosis. Visible right MCA branches are within normal limits. Visible left MCA branches demonstrate mild irregularity. IMPRESSION: 1. Acute lacunar infarct of the anterior Left Thalamus. No hemorrhage or mass effect. 2. Associated severe Left PCA stenosis, perhaps with acute involvement of the Left Thalamostriate Artery in light of #1. 3. Intracranial MRA also remarkable for moderate to severe bilateral ACA A2 segment stenosis. 4. No other acute intracranial abnormality. Other deep gray matter nuclei signal changes and widespread cerebral white matter signal changes most commonly due to chronic small vessel disease. Electronically Signed   By: Odessa FlemingH  Hall M.D.   On: 05/21/2017 16:34   Mr Brain Wo Contrast  Result Date: 05/21/2017 CLINICAL DATA:  83 year old female with recent unexplained confusion. EXAM: MRI HEAD WITHOUT  CONTRAST MRA HEAD WITHOUT CONTRAST TECHNIQUE: Multiplanar, multiecho pulse sequences of the brain and surrounding structures were obtained without intravenous contrast. Angiographic images of the head were obtained using MRA technique without contrast. COMPARISON:  Head CT without contrast 05/20/2017,  08/26/2016. FINDINGS: MRI HEAD FINDINGS Brain: There is a small 8-9 mm area of nodular restricted diffusion along the anterior left thalamus near the genu of the left internal capsule (series 3, image 24). There is associated T2 and FLAIR hyperintensity. No associated hemorrhage. No significant mass effect. No other restricted diffusion. Moderate bilateral superimposed deep gray matter nuclei T2 heterogeneity, although perhaps in large part due to prominent perivascular spaces. Confluent bilateral cerebral white matter T2 and FLAIR hyperintensity. No cortical encephalomalacia identified. No definite chronic cerebral blood products. The brainstem and cerebellum are normal aside from volume loss. No midline shift, mass effect, evidence of mass lesion, ventriculomegaly, extra-axial collection or acute intracranial hemorrhage. Cervicomedullary junction and pituitary are within normal limits. Vascular: Major intracranial vascular flow voids are preserved. Skull and upper cervical spine: Negative visualized cervical spine. Visualized bone marrow signal is within normal limits. Sinuses/Orbits: Postoperative changes to both globes. Otherwise negative orbits soft tissues. Paranasal sinuses and mastoids are stable and well pneumatized. Other: Visible internal auditory structures appear normal. Scalp and face soft tissues appear negative. MRA HEAD FINDINGS Antegrade flow in the posterior circulation. Dominant appearing distal right vertebral artery. Patent left PICA origin. Patent vertebrobasilar junction and basilar artery without stenosis. Patent dominant appearing right AICA origin. Patent SCA and PCA origins. Posterior communicating arteries are diminutive or absent. There is mild bilateral P1 segment irregularity, but there is severe stenosis of the left PCA P2 segment affecting a 3-4 mm portion of the vessel (series 1153 image 6). Fairly symmetric appearing distal left PCA flow signal despite this lesion. Distal right  PCA branches are within normal limits. Antegrade flow in both ICA siphons. Moderate siphon irregularity greater on the left. But only mild multifocal left siphon stenosis (pre cavernous segment and supraclinoid segment). No right siphon stenosis. Patent carotid termini. Normal ophthalmic artery origins. Normal MCA and ACA origins. Tortuous A1 segments. Anterior communicating artery and proximal A2 segments are normal. There is moderate to severe bilateral distal ACA A2 segment stenosis with preserved distal flow (series 1155, image 3). Both MCA M1 segments and MCA bifurcations are patent without stenosis. Visible right MCA branches are within normal limits. Visible left MCA branches demonstrate mild irregularity. IMPRESSION: 1. Acute lacunar infarct of the anterior Left Thalamus. No hemorrhage or mass effect. 2. Associated severe Left PCA stenosis, perhaps with acute involvement of the Left Thalamostriate Artery in light of #1. 3. Intracranial MRA also remarkable for moderate to severe bilateral ACA A2 segment stenosis. 4. No other acute intracranial abnormality. Other deep gray matter nuclei signal changes and widespread cerebral white matter signal changes most commonly due to chronic small vessel disease. Electronically Signed   By: Odessa FlemingH  Hall M.D.   On: 05/21/2017 16:34   Dg Chest Port 1 View  Result Date: 05/21/2017 CLINICAL DATA:  Attempted pacemaker placement. EXAM: PORTABLE CHEST 1 VIEW COMPARISON:  None. FINDINGS: Suboptimal inspiration accounts for crowded bronchovascular markings, especially in the bases, and accentuates the cardiac silhouette. Taking this into account, cardiac silhouette upper normal in size to slightly enlarged. Mild central peribronchial thickening. Lungs otherwise clear. Normal pulmonary vascularity. No pleural effusions. No pneumothorax. IMPRESSION: 1. Suboptimal inspiration. Mild changes of bronchitis and/or asthma without focal airspace pneumonia. 2. No pneumothorax.  Electronically Signed  By: Hulan Saas M.D.   On: 05/21/2017 20:49    Carotid Doppler  pending  TTE  - Left ventricle: The cavity size was normal. There was mild   concentric hypertrophy. Systolic function was normal. The   estimated ejection fraction was in the range of 60% to 65%. Wall   motion was normal; there were no regional wall motion   abnormalities. Features are consistent with a pseudonormal left   ventricular filling pattern, with concomitant abnormal relaxation   and increased filling pressure (grade 2 diastolic dysfunction). - Aortic valve: Transvalvular velocity was within the normal range.   There was no stenosis. There was mild regurgitation. Valve area   (Vmax): 1.95 cm^2. - Mitral valve: Transvalvular velocity was within the normal range.   There was no evidence for stenosis. There was trivial   regurgitation. - Left atrium: The atrium was mildly dilated. - Right ventricle: The cavity size was normal. Wall thickness was   normal. Systolic function was normal. - Tricuspid valve: There was mild regurgitation. - Pulmonary arteries: Systolic pressure was moderately increased.   PA peak pressure: 55 mm Hg (S).   PHYSICAL EXAM  Temp:  [97.6 F (36.4 C)-99.8 F (37.7 C)] 98.1 F (36.7 C) (12/21 1124) Pulse Rate:  [43-64] 49 (12/21 1124) Resp:  [0-42] 23 (12/21 1124) BP: (159-195)/(39-84) 159/44 (12/21 1124) SpO2:  [0 %-100 %] 93 % (12/21 1124)  General - Well nourished, well developed, in no apparent distress.  Ophthalmologic - fundi not visualized due to noncooperation.  Cardiovascular - Regular rhythm, but bradycardia.  Mental Status -  Awake alert but not orientated to her last name, age, place or time Language exam showed paucity of speech, paraphasic errors, able to repeat simple sentences, but not able to name, able to following simple commands but with moderate perseveration.   Cranial Nerves II - XII - II - Visual field intact OU. III, IV,  VI - Extraocular movements intact. V - Facial sensation intact bilaterally. VII - Facial movement intact bilaterally. VIII - Hearing & vestibular intact bilaterally. X - Palate elevates symmetrically. XI - Chin turning & shoulder shrug intact bilaterally. XII - Tongue protrusion intact.  Motor Strength - The patient's strength was normal in all extremities and pronator drift was absent.  Bulk was normal and fasciculations were absent.   Motor Tone - Muscle tone was assessed at the neck and appendages and was normal.  Reflexes - The patient's reflexes were symmetrical in all extremities and she had no pathological reflexes.  Sensory - Light touch, temperature/pinprick were assessed and were symmetrical.    Coordination - The patient had normal movements in the hands and feet with no ataxia or dysmetria.  Tremor was absent.  Gait and Station - deferred.   ASSESSMENT/PLAN Ms. Nicole [name] is a 83 y.o. female with history of HTN and polio admitted for confusion for one week. No tPA given due to out of window.    Stroke:  left superior mesial thalamic infarct, likely secondary to small vessel disease source  Resultant confusion, paraphasic error, anomia, perseveration  MRI  Left superior anterior thalamic infarct  MRA  B/l ACAs and PCAs stenosis, more prominent left P2/P3  Carotid Doppler pending  2D Echo  EF 60-65%  LDL 142  HgbA1c 5.7  lovenox for VTE prophylaxis  Diet Heart Room service appropriate? Yes; Fluid consistency: Thin   No antithrombotic prior to admission, now on aspirin 81 mg daily. Continue ASA on discharge  Patient counseled  to be compliant with her antithrombotic medications  Ongoing aggressive stroke risk factor management  Therapy recommendations:  pending  Disposition:  Pending  Bradycardia   tele showed brady, as low as 40s  Cardiology on board  Planned for pacemaker placement  Not successful yesterday.  Hypertension Stable  Long  term BP goal normotensive  Avoid low BP  Hyperlipidemia  Home meds:  none   LDL 142, goal < 70  Now added pravastatin 40  Continue statin at discharge  Other Stroke Risk Factors  Advanced age  Other Active Problems  B12, TSH, Ammonia, RPR all negative.  Hospital day # 2   Marvel Plan, MD PhD Stroke Neurology 05/22/2017 12:20 PM    To contact Stroke Continuity provider, please refer to [email or web address]. After hours, contact General Neurology

## 2017-05-22 NOTE — Evaluation (Signed)
MRI/MRA brain completed, revealing a subacute anterior left thalamic ischemic infarction. This presents the best explanation for the acute onset of her confusion approximately one week ago. She also has extensive atrophy and chronic ischemic changes. Stroke team to follow for full stroke work up. She has been started on ASA.  MRI brain official report conclusions: 1. Acute lacunar infarct of the anterior Left Thalamus. No hemorrhage or mass effect. 2. Associated severe Left PCA stenosis, perhaps with acute involvement of the Left Thalamostriate Artery in light of #1. 3. Intracranial MRA also remarkable for moderate to severe bilateral ACA A2 segment stenosis. 4. No other acute intracranial abnormality. Other deep gray matter nuclei signal changes and widespread cerebral white matter signal changes most commonly due to chronic small vessel disease.  Electronically signed: Dr. Caryl PinaEric Alka Falwell

## 2017-05-23 ENCOUNTER — Inpatient Hospital Stay (HOSPITAL_COMMUNITY): Payer: Medicare Other

## 2017-05-23 DIAGNOSIS — I459 Conduction disorder, unspecified: Secondary | ICD-10-CM

## 2017-05-23 LAB — CBC
HCT: 36.8 % (ref 36.0–46.0)
Hemoglobin: 11.7 g/dL — ABNORMAL LOW (ref 12.0–15.0)
MCH: 28.2 pg (ref 26.0–34.0)
MCHC: 31.8 g/dL (ref 30.0–36.0)
MCV: 88.7 fL (ref 78.0–100.0)
PLATELETS: 220 10*3/uL (ref 150–400)
RBC: 4.15 MIL/uL (ref 3.87–5.11)
RDW: 15.2 % (ref 11.5–15.5)
WBC: 9.8 10*3/uL (ref 4.0–10.5)

## 2017-05-23 LAB — BASIC METABOLIC PANEL
Anion gap: 6 (ref 5–15)
BUN: 16 mg/dL (ref 6–20)
CALCIUM: 8.5 mg/dL — AB (ref 8.9–10.3)
CO2: 24 mmol/L (ref 22–32)
Chloride: 111 mmol/L (ref 101–111)
Creatinine, Ser: 0.91 mg/dL (ref 0.44–1.00)
GFR calc Af Amer: >60 mL/min (ref >60)
GFR, EST NON AFRICAN AMERICAN: 56 mL/min — AB (ref >60)
GLUCOSE: 111 mg/dL — AB (ref 65–99)
Potassium: 3.3 mmol/L — ABNORMAL LOW (ref 3.5–5.1)
SODIUM: 141 mmol/L (ref 135–145)

## 2017-05-23 MED ORDER — HALOPERIDOL LACTATE 5 MG/ML IJ SOLN
2.0000 mg | Freq: Once | INTRAMUSCULAR | Status: AC
  Administered 2017-05-23: 2 mg via INTRAVENOUS
  Filled 2017-05-23: qty 1

## 2017-05-23 MED ORDER — STROKE: EARLY STAGES OF RECOVERY BOOK
Freq: Once | Status: AC
  Administered 2017-05-23: 18:00:00
  Filled 2017-05-23: qty 1

## 2017-05-23 NOTE — Progress Notes (Signed)
Physical Therapy Treatment Patient Details Name: Nicole [name] MRN: [id number] DOB: 1928/08/05 Today's Date: 05/23/2017    History of Present Illness Patient is an 83 yo female admitted 05/20/17 with AMS, worsening confusion. In ED, patient found to have bradycardia, heart block, hypertensive urgency, and acute stroke.   PMH:  HTN, back/leg pain, Polio.    PT Comments    Patient more able to participate with PT this afternoon.  Able to sit EOB with fair balance.  Will continue to follow.   Follow Up Recommendations  Home health PT;Supervision/Assistance - 24 hour;SNF     Equipment Recommendations  None recommended by PT    Recommendations for Other Services       Precautions / Restrictions Precautions Precautions: Fall Precaution Comments: Confusion Restrictions Weight Bearing Restrictions: No    Mobility  Bed Mobility Overal bed mobility: Needs Assistance Bed Mobility: Supine to Sit;Sit to Supine     Supine to sit: Min assist Sit to supine: Min guard   General bed mobility comments: Assist to raise trunk to upright position.  Able to sit EOB x 8 minutes.  Declined standing.    Transfers                 General transfer comment: Declined  Ambulation/Gait                 Stairs            Wheelchair Mobility    Modified Rankin (Stroke Patients Only) Modified Rankin (Stroke Patients Only) Pre-Morbid Rankin Score: Slight disability Modified Rankin: Moderately severe disability     Balance Overall balance assessment: Needs assistance Sitting-balance support: No upper extremity supported;Feet supported Sitting balance-Leahy Scale: Fair                                      Cognition Arousal/Alertness: Awake/alert Behavior During Therapy: WFL for tasks assessed/performed Overall Cognitive Status: Impaired/Different from baseline Area of Impairment: Orientation;Attention;Memory;Following  commands;Safety/judgement;Awareness;Problem solving                 Orientation Level: Disoriented to;Place;Time;Situation Current Attention Level: Sustained Memory: Decreased short-term memory Following Commands: Follows one step commands inconsistently;Follows one step commands with increased time Safety/Judgement: Decreased awareness of safety   Problem Solving: Slow processing;Difficulty sequencing;Requires verbal cues;Requires tactile cues General Comments: Patient participating more easily this pm.        Exercises      General Comments        Pertinent Vitals/Pain Pain Assessment: No/denies pain    Home Living                      Prior Function            PT Goals (current goals can now be found in the care plan section) Acute Rehab PT Goals Patient Stated Goal: None stated Progress towards PT goals: Progressing toward goals    Frequency    Min 4X/week      PT Plan Current plan remains appropriate    Co-evaluation              AM-PAC PT "6 Clicks" Daily Activity  Outcome Measure  Difficulty turning over in bed (including adjusting bedclothes, sheets and blankets)?: None Difficulty moving from lying on back to sitting on the side of the bed? : Unable Difficulty sitting down on and standing up from a chair with  arms (e.g., wheelchair, bedside commode, etc,.)?: Unable Help needed moving to and from a bed to chair (including a wheelchair)?: A Little Help needed walking in hospital room?: A Little Help needed climbing 3-5 steps with a railing? : A Lot 6 Click Score: 14    End of Session   Activity Tolerance: Patient tolerated treatment well(Self-limiting) Patient left: in bed;with call bell/phone within reach;with bed alarm set Nurse Communication: Mobility status PT Visit Diagnosis: Other abnormalities of gait and mobility (R26.89);Muscle weakness (generalized) (M62.81);Other symptoms and signs involving the nervous system  (U98.119(R29.898)     Time: [id number] PT Time Calculation (min) (ACUTE ONLY): 15 min  Charges:  $Therapeutic Activity: 8-22 mins                    G Codes:       Durenda HurtSusan H. Renaldo Fiddleravis, PT, [facility] KingsportMBA Acute Rehab Services Pager [phone]    Vena AustriaSusan H Teylor Wolven 05/23/2017, 3:49 PM

## 2017-05-23 NOTE — Evaluation (Signed)
Physical Therapy Evaluation Patient Details Name: Nicole [name] Howat MRN: [id number] DOB: [birth year] Today's Date: 05/23/2017   History of Present Illness  Patient is an 83 yo female admitted 05/20/17 with AMS, worsening confusion. In ED, patient found to have bradycardia, heart block, hypertensive urgency, and acute stroke.   PMH:  HTN, back/leg pain, Polio.  Clinical Impression  Patient presents with problems listed below.  Will benefit from acute PT to maximize functional mobility prior to discharge.  Patient's confusion/agitation impacting session today.  [facility] and have 24 hour assist to return home safely.  If not, will need to consider SNF.    Follow Up Recommendations Home health PT;Supervision/Assistance - 24 hour;SNF    Equipment Recommendations  None recommended by PT    Recommendations for Other Services       Precautions / Restrictions Precautions Precautions: Fall Precaution Comments: Confusion Restrictions Weight Bearing Restrictions: No      Mobility  Bed Mobility Overal bed mobility: Needs Assistance Bed Mobility: Supine to Sit;Sit to Supine     Supine to sit: Min assist Sit to supine: Min guard   General bed mobility comments: Repeated verbal and tactile cues to sit EOB.  Patient moved trunk to sitting position, requiring cues to move LE's off of bed.  Patient sat approx 20 seconds and pushed herself back to supine on bed.  Declined further mobility, stating she had "done this, this, and this" this morning.  [facility].  Transfers                 General transfer comment: Declined  Ambulation/Gait                Stairs            Wheelchair Mobility    Modified Rankin (Stroke Patients Only) Modified Rankin (Stroke Patients Only) Pre-Morbid Rankin Score: Slight disability Modified Rankin: Moderately severe disability     Balance                                              Pertinent Vitals/Pain Pain Assessment: No/denies pain    Home Living Family/patient expects to be discharged to:: Private residence Living Arrangements: Children(son and daughter-in-law live on 2nd floor, patient on 1st) Available Help at Discharge: Family;Available 24 hours/day Type of Home: House Home Access: Stairs to enter Entrance Stairs-Rails: Doctor, general practiceight;Left Entrance Stairs-Number of Steps: 6 Home Layout: Two level;Able to live on main level with bedroom/bathroom Home Equipment: Dan HumphreysWalker - 2 wheels;Cane - single point;Electric scooter;Shower seat - built in      Prior Function Level of Independence: Independent with assistive device(s)         Comments: Uses RW for gait.  Patient independent with bathing, dressing, meal prep.  Family provided transportation.     Hand Dominance        Extremity/Trunk Assessment   Upper Extremity Assessment Upper Extremity Assessment: Defer to OT evaluation    Lower Extremity Assessment Lower Extremity Assessment: Difficult to assess due to impaired cognition(Able to move LE's against gravity)       Communication   Communication: No difficulties  Cognition Arousal/Alertness: Awake/alert Behavior During Therapy: Agitated(Pushes self back on bed from sitting.) Overall Cognitive Status: Impaired/Different from baseline Area of Impairment: Orientation;Attention;Memory;Following commands;Safety/judgement;Awareness;Problem solving  Orientation Level: Disoriented to;Place;Time;Situation Current Attention Level: Sustained Memory: Decreased short-term memory Following Commands: Follows one step commands inconsistently;Follows one step commands with increased time Safety/Judgement: Decreased awareness of safety   Problem Solving: Slow processing;Difficulty sequencing;Requires verbal cues;Requires tactile cues General Comments: Patient resisting mobility,  returning to supine as PT asks patient to stand.  Begins speaking [place] (per son).  Can speak English if prompted.      General Comments      Exercises     Assessment/Plan    PT Assessment Patient needs continued PT services  PT Problem List Decreased strength;Decreased activity tolerance;Decreased balance;Decreased mobility;Decreased cognition;Decreased safety awareness       PT Treatment Interventions DME instruction;Gait training;Stair training;Functional mobility training;Therapeutic activities;Balance training;Neuromuscular re-education;Cognitive remediation;Patient/family education    PT Goals (Current goals can be found in the Care Plan section)  Acute Rehab PT Goals Patient Stated Goal: None stated PT Goal Formulation: With patient/family Time For Goal Achievement: 05/30/17 Potential to Achieve Goals: Fair    Frequency Min 4X/week   Barriers to discharge        Co-evaluation               AM-PAC PT "6 Clicks" Daily Activity  Outcome Measure Difficulty turning over in bed (including adjusting bedclothes, sheets and blankets)?: None Difficulty moving from lying on back to sitting on the side of the bed? : Unable Difficulty sitting down on and standing up from a chair with arms (e.g., wheelchair, bedside commode, etc,.)?: Unable Help needed moving to and from a bed to chair (including a wheelchair)?: A Little Help needed walking in hospital room?: A Little Help needed climbing 3-5 steps with a railing? : A Lot 6 Click Score: 14    End of Session   Activity Tolerance: Treatment limited secondary to agitation Patient left: in bed;with call bell/phone within reach;with bed alarm set;with family/visitor present   PT Visit Diagnosis: Other abnormalities of gait and mobility (R26.89);Muscle weakness (generalized) (M62.81);Other symptoms and signs involving the nervous system (W09.811(R29.898)    Time: [id number] PT Time Calculation (min) (ACUTE ONLY): 13  min   Charges:   PT Evaluation $PT Eval Moderate Complexity: 1 Mod     PT G Codes:        Durenda HurtSusan H. Renaldo Fiddleravis, PT, [facility] Acute Rehab Services Pager [phone]   Vena AustriaSusan H Elida Harbin 05/23/2017, 2:10 PM

## 2017-05-23 NOTE — Progress Notes (Signed)
VASCULAR LAB PRELIMINARY  PRELIMINARY  PRELIMINARY  PRELIMINARY  Carotid duplex completed.    Preliminary report:  1-39% ICA stenosis. Vertebral artery flow is antegrade.   Shanese Riemenschneider, RVT 05/23/2017, 4:14 PM

## 2017-05-23 NOTE — Progress Notes (Signed)
Progress Note  Patient Name: Nicole [name] Date of Encounter: 05/23/2017  Primary Cardiologist: None  Subjective   Denies chest pain or shortness of breath.  Headache resolved. answer simple questions  Inpatient Medications    Scheduled Meds: . aspirin  81 mg Oral Daily  . Chlorhexidine Gluconate Cloth  6 each Topical Daily  . enoxaparin (LOVENOX) injection  40 mg Subcutaneous Q24H  . lisinopril  20 mg Oral Daily  . mupirocin ointment  1 application Nasal BID  . pravastatin  40 mg Oral q1800  . sodium chloride flush  3 mL Intravenous Q12H   Continuous Infusions: . sodium chloride 100 mL/hr (05/23/17 0348)   PRN Meds: acetaminophen, hydrALAZINE, HYDROcodone-acetaminophen, ondansetron (ZOFRAN) IV, ondansetron **OR** [DISCONTINUED] ondansetron (ZOFRAN) IV, senna-docusate   Vital Signs    Vitals:   05/22/17 2042 05/22/17 2357 05/23/17 0530 05/23/17 0747  BP: (!) 143/44 (!) 128/43 (!) 139/46 (!) 180/47  Pulse: (!) 46 (!) 44 (!) 46   Resp: 15 19 (!) 21 (!) 25  Temp: 98.5 F (36.9 C) 98 F (36.7 C) 97.7 F (36.5 C)   TempSrc: Oral Oral Axillary   SpO2: 95% 97% 92%   Weight:   139 lb 5.3 oz (63.2 kg)     Intake/Output Summary (Last 24 hours) at 05/23/2017 0930 Last data filed at 05/23/2017 0800 Gross per 24 hour  Intake 536.67 ml  Output 450 ml  Net 86.67 ml   Filed Weights   05/20/17 2302 05/21/17 0325 05/23/17 0530  Weight: 131 lb 9.6 oz (59.7 kg) 131 lb (59.4 kg) 139 lb 5.3 oz (63.2 kg)    Telemetry    Normal sinus rhythm with 2-1 heart block- Personally Reviewed  ECG    None- Personally Reviewed  Physical Exam   GEN: No acute distress.   Neck: No JVD Cardiac: RRR, no murmurs, rubs, or gallops.  Respiratory: Clear to auscultation bilaterally. GI: Soft, nontender, non-distended  MS: No edema; No deformity. Neuro:  Nonfocal, still some confusion, but does answer questions, does not know where she is. Psych: Blunted  Labs     Chemistry Recent Labs  Lab 05/20/17 1220 05/21/17 0225 05/21/17 1922 05/22/17 1203  NA 142 143  --  139  K 3.6 3.4*  --  3.6  CL 108 109  --  108  CO2 28 28  --  22  GLUCOSE 96 113*  --  105*  BUN 22* 15  --  17  CREATININE 1.01* 0.88 0.92 1.00  CALCIUM 9.0 9.2  --  9.1  PROT 6.4*  --   --   --   ALBUMIN 3.6  --   --   --   AST 14*  --   --   --   ALT 14  --   --   --   ALKPHOS 76  --   --   --   BILITOT 0.5  --   --   --   GFRNONAA 49* 58* 55* 50*  GFRAA 57* >60 >60 57*  ANIONGAP 6 6  --  9     Hematology Recent Labs  Lab 05/20/17 1220 05/21/17 0225 05/21/17 1922  WBC 7.2  --  14.2*  RBC 4.27  --  4.67  HGB 12.1  --  13.2  HCT 37.3 38.7 41.0  MCV 87.4  --  87.8  MCH 28.3  --  28.3  MCHC 32.4  --  32.2  RDW 14.6  --  14.9  PLT  230  --  213    Cardiac Enzymes Recent Labs  Lab 05/20/17 1220 05/21/17 0225 05/21/17 0700 05/21/17 1414  TROPONINI <0.03 <0.03 <0.03 0.03*   No results for input(s): TROPIPOC in the last 168 hours.   BNPNo results for input(s): BNP, PROBNP in the last 168 hours.   DDimer No results for input(s): DDIMER in the last 168 hours.   Radiology    Ct Head Wo Contrast  Result Date: 05/22/2017 CLINICAL DATA:  Stroke, follow-up, change in mental status today. EXAM: CT HEAD WITHOUT CONTRAST TECHNIQUE: Contiguous axial images were obtained from the base of the skull through the vertex without intravenous contrast. COMPARISON:  Head CT dated 05/20/2017.  Brain MRI dated 05/21/2017. FINDINGS: Brain: Again noted is generalized parenchymal atrophy with commensurate dilatation of the ventricles and sulci, stable. Chronic small vessel ischemic changes are again seen throughout the bilateral periventricular and subcortical white matter regions, stable. Low-density focus in the left thalamus is compatible with focal edema related to the acute infarct demonstrated on yesterday's brain MRI, stable in size. There are no new parenchymal findings. No  parenchymal or extra-axial hemorrhage. Vascular: There are chronic calcified atherosclerotic changes of the large vessels at the skull base. No unexpected hyperdense vessel. Skull: Normal. Negative for fracture or focal lesion. Sinuses/Orbits: No acute finding. Other: None. IMPRESSION: No new intracranial abnormality. No intracranial hemorrhage. No mass effect, midline shift or herniation. Small focus of edema within the left thalamus, corresponding to the acute lacunar infarct described on yesterday's MRI. Chronic small vessel ischemic changes in the white matter. Electronically Signed   By: Bary RichardStan  Maynard M.D.   On: 05/22/2017 16:47   Mr Maxine GlennMra Head Wo Contrast  Result Date: 05/21/2017 CLINICAL DATA:  83 year old female with recent unexplained confusion. EXAM: MRI HEAD WITHOUT CONTRAST MRA HEAD WITHOUT CONTRAST TECHNIQUE: Multiplanar, multiecho pulse sequences of the brain and surrounding structures were obtained without intravenous contrast. Angiographic images of the head were obtained using MRA technique without contrast. COMPARISON:  Head CT without contrast 05/20/2017, 08/26/2016. FINDINGS: MRI HEAD FINDINGS Brain: There is a small 8-9 mm area of nodular restricted diffusion along the anterior left thalamus near the genu of the left internal capsule (series 3, image 24). There is associated T2 and FLAIR hyperintensity. No associated hemorrhage. No significant mass effect. No other restricted diffusion. Moderate bilateral superimposed deep gray matter nuclei T2 heterogeneity, although perhaps in large part due to prominent perivascular spaces. Confluent bilateral cerebral white matter T2 and FLAIR hyperintensity. No cortical encephalomalacia identified. No definite chronic cerebral blood products. The brainstem and cerebellum are normal aside from volume loss. No midline shift, mass effect, evidence of mass lesion, ventriculomegaly, extra-axial collection or acute intracranial hemorrhage. Cervicomedullary  junction and pituitary are within normal limits. Vascular: Major intracranial vascular flow voids are preserved. Skull and upper cervical spine: Negative visualized cervical spine. Visualized bone marrow signal is within normal limits. Sinuses/Orbits: Postoperative changes to both globes. Otherwise negative orbits soft tissues. Paranasal sinuses and mastoids are stable and well pneumatized. Other: Visible internal auditory structures appear normal. Scalp and face soft tissues appear negative. MRA HEAD FINDINGS Antegrade flow in the posterior circulation. Dominant appearing distal right vertebral artery. Patent left PICA origin. Patent vertebrobasilar junction and basilar artery without stenosis. Patent dominant appearing right AICA origin. Patent SCA and PCA origins. Posterior communicating arteries are diminutive or absent. There is mild bilateral P1 segment irregularity, but there is severe stenosis of the left PCA P2 segment affecting a 3-4  mm portion of the vessel (series 1153 image 6). Fairly symmetric appearing distal left PCA flow signal despite this lesion. Distal right PCA branches are within normal limits. Antegrade flow in both ICA siphons. Moderate siphon irregularity greater on the left. But only mild multifocal left siphon stenosis (pre cavernous segment and supraclinoid segment). No right siphon stenosis. Patent carotid termini. Normal ophthalmic artery origins. Normal MCA and ACA origins. Tortuous A1 segments. Anterior communicating artery and proximal A2 segments are normal. There is moderate to severe bilateral distal ACA A2 segment stenosis with preserved distal flow (series 1155, image 3). Both MCA M1 segments and MCA bifurcations are patent without stenosis. Visible right MCA branches are within normal limits. Visible left MCA branches demonstrate mild irregularity. IMPRESSION: 1. Acute lacunar infarct of the anterior Left Thalamus. No hemorrhage or mass effect. 2. Associated severe Left PCA  stenosis, perhaps with acute involvement of the Left Thalamostriate Artery in light of #1. 3. Intracranial MRA also remarkable for moderate to severe bilateral ACA A2 segment stenosis. 4. No other acute intracranial abnormality. Other deep gray matter nuclei signal changes and widespread cerebral white matter signal changes most commonly due to chronic small vessel disease. Electronically Signed   By: Odessa Fleming M.D.   On: 05/21/2017 16:34   Mr Brain Wo Contrast  Result Date: 05/21/2017 CLINICAL DATA:  83 year old female with recent unexplained confusion. EXAM: MRI HEAD WITHOUT CONTRAST MRA HEAD WITHOUT CONTRAST TECHNIQUE: Multiplanar, multiecho pulse sequences of the brain and surrounding structures were obtained without intravenous contrast. Angiographic images of the head were obtained using MRA technique without contrast. COMPARISON:  Head CT without contrast 05/20/2017, 08/26/2016. FINDINGS: MRI HEAD FINDINGS Brain: There is a small 8-9 mm area of nodular restricted diffusion along the anterior left thalamus near the genu of the left internal capsule (series 3, image 24). There is associated T2 and FLAIR hyperintensity. No associated hemorrhage. No significant mass effect. No other restricted diffusion. Moderate bilateral superimposed deep gray matter nuclei T2 heterogeneity, although perhaps in large part due to prominent perivascular spaces. Confluent bilateral cerebral white matter T2 and FLAIR hyperintensity. No cortical encephalomalacia identified. No definite chronic cerebral blood products. The brainstem and cerebellum are normal aside from volume loss. No midline shift, mass effect, evidence of mass lesion, ventriculomegaly, extra-axial collection or acute intracranial hemorrhage. Cervicomedullary junction and pituitary are within normal limits. Vascular: Major intracranial vascular flow voids are preserved. Skull and upper cervical spine: Negative visualized cervical spine. Visualized bone marrow  signal is within normal limits. Sinuses/Orbits: Postoperative changes to both globes. Otherwise negative orbits soft tissues. Paranasal sinuses and mastoids are stable and well pneumatized. Other: Visible internal auditory structures appear normal. Scalp and face soft tissues appear negative. MRA HEAD FINDINGS Antegrade flow in the posterior circulation. Dominant appearing distal right vertebral artery. Patent left PICA origin. Patent vertebrobasilar junction and basilar artery without stenosis. Patent dominant appearing right AICA origin. Patent SCA and PCA origins. Posterior communicating arteries are diminutive or absent. There is mild bilateral P1 segment irregularity, but there is severe stenosis of the left PCA P2 segment affecting a 3-4 mm portion of the vessel (series 1153 image 6). Fairly symmetric appearing distal left PCA flow signal despite this lesion. Distal right PCA branches are within normal limits. Antegrade flow in both ICA siphons. Moderate siphon irregularity greater on the left. But only mild multifocal left siphon stenosis (pre cavernous segment and supraclinoid segment). No right siphon stenosis. Patent carotid termini. Normal ophthalmic artery origins. Normal MCA  and ACA origins. Tortuous A1 segments. Anterior communicating artery and proximal A2 segments are normal. There is moderate to severe bilateral distal ACA A2 segment stenosis with preserved distal flow (series 1155, image 3). Both MCA M1 segments and MCA bifurcations are patent without stenosis. Visible right MCA branches are within normal limits. Visible left MCA branches demonstrate mild irregularity. IMPRESSION: 1. Acute lacunar infarct of the anterior Left Thalamus. No hemorrhage or mass effect. 2. Associated severe Left PCA stenosis, perhaps with acute involvement of the Left Thalamostriate Artery in light of #1. 3. Intracranial MRA also remarkable for moderate to severe bilateral ACA A2 segment stenosis. 4. No other acute  intracranial abnormality. Other deep gray matter nuclei signal changes and widespread cerebral white matter signal changes most commonly due to chronic small vessel disease. Electronically Signed   By: Odessa Fleming M.D.   On: 05/21/2017 16:34   Dg Chest Port 1 View  Result Date: 05/21/2017 CLINICAL DATA:  Attempted pacemaker placement. EXAM: PORTABLE CHEST 1 VIEW COMPARISON:  None. FINDINGS: Suboptimal inspiration accounts for crowded bronchovascular markings, especially in the bases, and accentuates the cardiac silhouette. Taking this into account, cardiac silhouette upper normal in size to slightly enlarged. Mild central peribronchial thickening. Lungs otherwise clear. Normal pulmonary vascularity. No pleural effusions. No pneumothorax. IMPRESSION: 1. Suboptimal inspiration. Mild changes of bronchitis and/or asthma without focal airspace pneumonia. 2. No pneumothorax. Electronically Signed   By: Hulan Saas M.D.   On: 05/21/2017 20:49    Cardiac Studies   None  Patient Profile     83 y.o. female admitted with altered mental status, found to have 2-1 heart block, found to have a stroke, now with continued mental status changes, pending EEG  Assessment & Plan    1.  2-1 heart block -blood pressure is stable.  Her heart block cannot explained her mental status changes.  I would recommend continued telemetry.  Avoid AV nodal blocking drugs.  No plans for pacemaker at this time.  Would like to see her mental status normalized.  If she develops worsening heart block, which is unlikely, a temporary wire could be placed possibly it.  I do not expect her heart block to worsen. 2.  Stroke -MRI and CT scan reviewed.  As per neurology service 3.  Hypertension -her blood pressure has improved.  No change in medications  For questions or updates, please contact [facility] Please consult [email or web address] for contact info under Cardiology/STEMI.      Signed, Lewayne Bunting, MD  05/23/2017, 9:30 AM   Patient ID: Nicole [name], female   DOB: [birth year], 83 y.o.   MRN: [id number]

## 2017-05-23 NOTE — Progress Notes (Addendum)
PROGRESS NOTE    Nicole [name]  ZOX:[id number] DOB: 1928/09/23 DOA: 05/20/2017 PCP: System, Pcp Not In    Brief Narrative: Nicole [name] is a 83 y.o. female with medical history significant for hypertension, now presenting to the emergency department for evaluation of confusion.  History was obtained through family, discussion with ED personnel, and review of the EMR.  She has reportedly been having some difficulties with confusion for a year that has not previously been evaluated, but her son reports that this has worsened significantly in the past couple days.  Patient is not expressing any specific complaints.  There was no recent fall or trauma and she is not known to use illicit substances or alcohol.  She has not had a PCP and her medications ran out in March 2018.  [facility] ED Course: Upon arrival to the ED, patient is found to be afebrile, saturating well on room air, bradycardic in the 40s, and hypertensive with systolic pressures in the 200s.  EKG features a bradycardia with rate 42 and apparent Mobitz type II.  Noncontrast head CT is negative for acute intracranial abnormality.  Chemistry panel and CBC are unremarkable and troponin is undetectable.  Urine is not particularly suggestive of infection and sample was sent for culture.  Patient was treated with Norvasc and lisinopril in the emergency department and given prn doses of hydralazine.  Blood pressure improved, heart rate remained in the 40s, and there has not been any apparent respiratory distress.  Cardiology was consulted by the ED physician and recommended medical admission to [facility] for possible pacemaker.  Will be admitted to the stepdown unit and [facility] for ongoing evaluation and management of heart block, hypertensive urgency, and confusion.  Patient admitted with AMS, worsening confusion. Found to have hypertensive urgency, acute stroke and AV block. Neurology and EP has been helping   With patient management.    Assessment & Plan:   Principal Problem:   Heart block Active Problems:   Hypertensive urgency   Memory deficit   Acute encephalopathy   Mobitz type 2 second degree heart block   Cerebral thrombosis with cerebral infarction   Pacemaker  1-Bradycardia, Mobitz type 2 block.  TSH normal.  ECHO EF 60 % diastolic dysfunction grade 2.  Mg normal.  Cardiology consulted. Patient was not able to tolerates pacemake implantation 12-20. She will need general anesthesia. Also family thinking about it. Per Dr Ladona Ridgel hold on getting pacemaker until MS improved. Could use temporary wire if AV block get worse. See Dr Ladona Ridgel note from 12-22.  2-Acute thalamic stroke.  Acute encephalopathy MRI positive for thalamic stroke.  Neurology consulted. Started on aspirin and statins.  B 12 normal. LDL 142.  Worsening Mental status the  Afternoon of 05-22-2017. She was more lethargic.  CT head negative for acute finding/ ABG negative hypercapnia, mild hypoxemia. Place of 2 L of oxygen. Received IV bolus.  -Mental status improved this morning, alert , answering some questions, but still very confuse.  -plan for EEG today.   3- Hypertension with hypertensive urgency  Continue with hydralazine PRN.  Continue  with  lisinopril.  Hold Norvasc to avoid hypotension. Will follow trend of BP today. BP goal 160. BP on admission 230.   4-Urine retention;  Had In and Out yesterday. If retention persist she will need foley catheter./    DVT prophylaxis: Lovenox Code Status: full code.  Family Communication: no family at bedside.  Disposition Plan: remain inpatient.  Consultants:   Cardiology  Neurology    Procedures:   ECHO   Antimicrobials: none   Subjective: She is more alert this morning, she is still confuse. She was not able to tell me how many kids she has. She today denies having brothers and that is correct. She knows that she confuse [name] with Cindee Lame.     Objective: Vitals:   05/22/17 2042 05/22/17 2357 05/23/17 0530 05/23/17 0747  BP: (!) 143/44 (!) 128/43 (!) 139/46 (!) 180/47  Pulse: (!) 46 (!) 44 (!) 46   Resp: 15 19 (!) 21 (!) 25  Temp: 98.5 F (36.9 C) 98 F (36.7 C) 97.7 F (36.5 C)   TempSrc: Oral Oral Axillary   SpO2: 95% 97% 92%   Weight:   63.2 kg (139 lb 5.3 oz)     Intake/Output Summary (Last 24 hours) at 05/23/2017 0959 Last data filed at 05/23/2017 0800 Gross per 24 hour  Intake 536.67 ml  Output 450 ml  Net 86.67 ml   Filed Weights   05/20/17 2302 05/21/17 0325 05/23/17 0530  Weight: 59.7 kg (131 lb 9.6 oz) 59.4 kg (131 lb) 63.2 kg (139 lb 5.3 oz)    Examination:  General exam: Alert, sitting at bedside commode.  Respiratory system:  Normal respiratory effort, CTA Cardiovascular system: S 1, S 2 RRR, no edema Gastrointestinal system: BS present, soft, nt Central nervous system:  Alert, follows some command.  Extremities: no edema     Data Reviewed: I have personally reviewed following labs and imaging studies  CBC: Recent Labs  Lab 05/20/17 1220 05/21/17 0225 05/21/17 1922 05/23/17 0819  WBC 7.2  --  14.2* 9.8  NEUTROABS 5.5  --   --   --   HGB 12.1  --  13.2 11.7*  HCT 37.3 38.7 41.0 36.8  MCV 87.4  --  87.8 88.7  PLT 230  --  213 220   Basic Metabolic Panel: Recent Labs  Lab 05/20/17 1220 05/21/17 0225 05/21/17 1922 05/22/17 1203  NA 142 143  --  139  K 3.6 3.4*  --  3.6  CL 108 109  --  108  CO2 28 28  --  22  GLUCOSE 96 113*  --  105*  BUN 22* 15  --  17  CREATININE 1.01* 0.88 0.92 1.00  CALCIUM 9.0 9.2  --  9.1  MG  --  2.0  --   --    GFR: CrCl cannot be calculated (Unknown ideal weight.). Liver Function Tests: Recent Labs  Lab 05/20/17 1220  AST 14*  ALT 14  ALKPHOS 76  BILITOT 0.5  PROT 6.4*  ALBUMIN 3.6   No results for input(s): LIPASE, AMYLASE in the last 168 hours. Recent Labs  Lab 05/21/17 0225  AMMONIA 13   Coagulation Profile: No results  for input(s): INR, PROTIME in the last 168 hours. Cardiac Enzymes: Recent Labs  Lab 05/20/17 1220 05/21/17 0225 05/21/17 0700 05/21/17 1414  TROPONINI <0.03 <0.03 <0.03 0.03*   BNP (last 3 results) No results for input(s): PROBNP in the last 8760 hours. HbA1C: Recent Labs    05/22/17 0900  HGBA1C 5.7*   CBG: Recent Labs  Lab 05/22/17 1559  GLUCAP 114*   Lipid Profile: Recent Labs    05/22/17 0842  CHOL 215*  HDL 37*  LDLCALC 142*  TRIG 179*  CHOLHDL 5.8   Thyroid Function Tests: Recent Labs    05/21/17 0225  TSH 1.246   Anemia Panel: Recent  Labs    05/21/17 0225  VITAMINB12 488   Sepsis Labs: No results for input(s): PROCALCITON, LATICACIDVEN in the last 168 hours.  Recent Results (from the past 240 hour(s))  Urine culture     Status: Abnormal   Collection Time: 05/20/17  2:45 PM  Result Value Ref Range Status   Specimen Description URINE, CLEAN CATCH  Final   Special Requests NONE  Final   Culture MULTIPLE SPECIES PRESENT, SUGGEST RECOLLECTION (A)  Final   Report Status 05/21/2017 FINAL  Final  MRSA PCR Screening     Status: None   Collection Time: 05/20/17 11:50 PM  Result Value Ref Range Status   MRSA by PCR NEGATIVE NEGATIVE Final    Comment:        The GeneXpert MRSA Assay (FDA approved for NASAL specimens only), is one component of a comprehensive MRSA colonization surveillance program. It is not intended to diagnose MRSA infection nor to guide or monitor treatment for MRSA infections.   Surgical PCR screen     Status: Abnormal   Collection Time: 05/21/17  2:24 PM  Result Value Ref Range Status   MRSA, PCR NEGATIVE NEGATIVE Final   Staphylococcus aureus POSITIVE (A) NEGATIVE Final    Comment: (NOTE) The Xpert SA Assay (FDA approved for NASAL specimens in patients 58 years of age and older), is one component of a comprehensive surveillance program. It is not intended to diagnose infection nor to guide or monitor treatment.           Radiology Studies: Ct Head Wo Contrast  Result Date: 05/22/2017 CLINICAL DATA:  Stroke, follow-up, change in mental status today. EXAM: CT HEAD WITHOUT CONTRAST TECHNIQUE: Contiguous axial images were obtained from the base of the skull through the vertex without intravenous contrast. COMPARISON:  Head CT dated 05/20/2017.  Brain MRI dated 05/21/2017. FINDINGS: Brain: Again noted is generalized parenchymal atrophy with commensurate dilatation of the ventricles and sulci, stable. Chronic small vessel ischemic changes are again seen throughout the bilateral periventricular and subcortical white matter regions, stable. Low-density focus in the left thalamus is compatible with focal edema related to the acute infarct demonstrated on yesterday's brain MRI, stable in size. There are no new parenchymal findings. No parenchymal or extra-axial hemorrhage. Vascular: There are chronic calcified atherosclerotic changes of the large vessels at the skull base. No unexpected hyperdense vessel. Skull: Normal. Negative for fracture or focal lesion. Sinuses/Orbits: No acute finding. Other: None. IMPRESSION: No new intracranial abnormality. No intracranial hemorrhage. No mass effect, midline shift or herniation. Small focus of edema within the left thalamus, corresponding to the acute lacunar infarct described on yesterday's MRI. Chronic small vessel ischemic changes in the white matter. Electronically Signed   By: Bary Richard M.D.   On: 05/22/2017 16:47   Mr Maxine Glenn Head Wo Contrast  Result Date: 05/21/2017 CLINICAL DATA:  83 year old female with recent unexplained confusion. EXAM: MRI HEAD WITHOUT CONTRAST MRA HEAD WITHOUT CONTRAST TECHNIQUE: Multiplanar, multiecho pulse sequences of the brain and surrounding structures were obtained without intravenous contrast. Angiographic images of the head were obtained using MRA technique without contrast. COMPARISON:  Head CT without contrast 05/20/2017, 08/26/2016.  FINDINGS: MRI HEAD FINDINGS Brain: There is a small 8-9 mm area of nodular restricted diffusion along the anterior left thalamus near the genu of the left internal capsule (series 3, image 24). There is associated T2 and FLAIR hyperintensity. No associated hemorrhage. No significant mass effect. No other restricted diffusion. Moderate bilateral superimposed deep gray matter  nuclei T2 heterogeneity, although perhaps in large part due to prominent perivascular spaces. Confluent bilateral cerebral white matter T2 and FLAIR hyperintensity. No cortical encephalomalacia identified. No definite chronic cerebral blood products. The brainstem and cerebellum are normal aside from volume loss. No midline shift, mass effect, evidence of mass lesion, ventriculomegaly, extra-axial collection or acute intracranial hemorrhage. Cervicomedullary junction and pituitary are within normal limits. Vascular: Major intracranial vascular flow voids are preserved. Skull and upper cervical spine: Negative visualized cervical spine. Visualized bone marrow signal is within normal limits. Sinuses/Orbits: Postoperative changes to both globes. Otherwise negative orbits soft tissues. Paranasal sinuses and mastoids are stable and well pneumatized. Other: Visible internal auditory structures appear normal. Scalp and face soft tissues appear negative. MRA HEAD FINDINGS Antegrade flow in the posterior circulation. Dominant appearing distal right vertebral artery. Patent left PICA origin. Patent vertebrobasilar junction and basilar artery without stenosis. Patent dominant appearing right AICA origin. Patent SCA and PCA origins. Posterior communicating arteries are diminutive or absent. There is mild bilateral P1 segment irregularity, but there is severe stenosis of the left PCA P2 segment affecting a 3-4 mm portion of the vessel (series 1153 image 6). Fairly symmetric appearing distal left PCA flow signal despite this lesion. Distal right PCA branches  are within normal limits. Antegrade flow in both ICA siphons. Moderate siphon irregularity greater on the left. But only mild multifocal left siphon stenosis (pre cavernous segment and supraclinoid segment). No right siphon stenosis. Patent carotid termini. Normal ophthalmic artery origins. Normal MCA and ACA origins. Tortuous A1 segments. Anterior communicating artery and proximal A2 segments are normal. There is moderate to severe bilateral distal ACA A2 segment stenosis with preserved distal flow (series 1155, image 3). Both MCA M1 segments and MCA bifurcations are patent without stenosis. Visible right MCA branches are within normal limits. Visible left MCA branches demonstrate mild irregularity. IMPRESSION: 1. Acute lacunar infarct of the anterior Left Thalamus. No hemorrhage or mass effect. 2. Associated severe Left PCA stenosis, perhaps with acute involvement of the Left Thalamostriate Artery in light of #1. 3. Intracranial MRA also remarkable for moderate to severe bilateral ACA A2 segment stenosis. 4. No other acute intracranial abnormality. Other deep gray matter nuclei signal changes and widespread cerebral white matter signal changes most commonly due to chronic small vessel disease. Electronically Signed   By: Odessa FlemingH  Hall M.D.   On: 05/21/2017 16:34   Mr Brain Wo Contrast  Result Date: 05/21/2017 CLINICAL DATA:  83 year old female with recent unexplained confusion. EXAM: MRI HEAD WITHOUT CONTRAST MRA HEAD WITHOUT CONTRAST TECHNIQUE: Multiplanar, multiecho pulse sequences of the brain and surrounding structures were obtained without intravenous contrast. Angiographic images of the head were obtained using MRA technique without contrast. COMPARISON:  Head CT without contrast 05/20/2017, 08/26/2016. FINDINGS: MRI HEAD FINDINGS Brain: There is a small 8-9 mm area of nodular restricted diffusion along the anterior left thalamus near the genu of the left internal capsule (series 3, image 24). There is  associated T2 and FLAIR hyperintensity. No associated hemorrhage. No significant mass effect. No other restricted diffusion. Moderate bilateral superimposed deep gray matter nuclei T2 heterogeneity, although perhaps in large part due to prominent perivascular spaces. Confluent bilateral cerebral white matter T2 and FLAIR hyperintensity. No cortical encephalomalacia identified. No definite chronic cerebral blood products. The brainstem and cerebellum are normal aside from volume loss. No midline shift, mass effect, evidence of mass lesion, ventriculomegaly, extra-axial collection or acute intracranial hemorrhage. Cervicomedullary junction and pituitary are within normal limits. Vascular:  Major intracranial vascular flow voids are preserved. Skull and upper cervical spine: Negative visualized cervical spine. Visualized bone marrow signal is within normal limits. Sinuses/Orbits: Postoperative changes to both globes. Otherwise negative orbits soft tissues. Paranasal sinuses and mastoids are stable and well pneumatized. Other: Visible internal auditory structures appear normal. Scalp and face soft tissues appear negative. MRA HEAD FINDINGS Antegrade flow in the posterior circulation. Dominant appearing distal right vertebral artery. Patent left PICA origin. Patent vertebrobasilar junction and basilar artery without stenosis. Patent dominant appearing right AICA origin. Patent SCA and PCA origins. Posterior communicating arteries are diminutive or absent. There is mild bilateral P1 segment irregularity, but there is severe stenosis of the left PCA P2 segment affecting a 3-4 mm portion of the vessel (series 1153 image 6). Fairly symmetric appearing distal left PCA flow signal despite this lesion. Distal right PCA branches are within normal limits. Antegrade flow in both ICA siphons. Moderate siphon irregularity greater on the left. But only mild multifocal left siphon stenosis (pre cavernous segment and supraclinoid  segment). No right siphon stenosis. Patent carotid termini. Normal ophthalmic artery origins. Normal MCA and ACA origins. Tortuous A1 segments. Anterior communicating artery and proximal A2 segments are normal. There is moderate to severe bilateral distal ACA A2 segment stenosis with preserved distal flow (series 1155, image 3). Both MCA M1 segments and MCA bifurcations are patent without stenosis. Visible right MCA branches are within normal limits. Visible left MCA branches demonstrate mild irregularity. IMPRESSION: 1. Acute lacunar infarct of the anterior Left Thalamus. No hemorrhage or mass effect. 2. Associated severe Left PCA stenosis, perhaps with acute involvement of the Left Thalamostriate Artery in light of #1. 3. Intracranial MRA also remarkable for moderate to severe bilateral ACA A2 segment stenosis. 4. No other acute intracranial abnormality. Other deep gray matter nuclei signal changes and widespread cerebral white matter signal changes most commonly due to chronic small vessel disease. Electronically Signed   By: Odessa FlemingH  Hall M.D.   On: 05/21/2017 16:34   Dg Chest Port 1 View  Result Date: 05/21/2017 CLINICAL DATA:  Attempted pacemaker placement. EXAM: PORTABLE CHEST 1 VIEW COMPARISON:  None. FINDINGS: Suboptimal inspiration accounts for crowded bronchovascular markings, especially in the bases, and accentuates the cardiac silhouette. Taking this into account, cardiac silhouette upper normal in size to slightly enlarged. Mild central peribronchial thickening. Lungs otherwise clear. Normal pulmonary vascularity. No pleural effusions. No pneumothorax. IMPRESSION: 1. Suboptimal inspiration. Mild changes of bronchitis and/or asthma without focal airspace pneumonia. 2. No pneumothorax. Electronically Signed   By: Hulan Saashomas  Lawrence M.D.   On: 05/21/2017 20:49        Scheduled Meds: . aspirin  81 mg Oral Daily  . Chlorhexidine Gluconate Cloth  6 each Topical Daily  . enoxaparin (LOVENOX) injection   40 mg Subcutaneous Q24H  . lisinopril  20 mg Oral Daily  . mupirocin ointment  1 application Nasal BID  . pravastatin  40 mg Oral q1800  . sodium chloride flush  3 mL Intravenous Q12H   Continuous Infusions: . sodium chloride 100 mL/hr (05/23/17 0348)     LOS: 3 days    Time spent: 35 minutes.     Alba CoryBelkys A Draedyn Weidinger, MD [facility] Pager [phone]  If 7PM-7AM, please contact night-coverage [email or web address] Password [email or web address] 05/23/2017, 9:59 AM

## 2017-05-23 NOTE — Evaluation (Addendum)
Clinical/Bedside Swallow Evaluation Patient Details  Name: Nicole [name] MRN: [id number] Date of Birth: [birth year]  Today's Date: 05/23/2017 Time: SLP Start Time (ACUTE ONLY): 1340 SLP Stop Time (ACUTE ONLY): 1355 SLP Time Calculation (min) (ACUTE ONLY): 15 min  Past Medical History:  Past Medical History:  Diagnosis Date  . Hypertension   . Polio    Past Surgical History:  Past Surgical History:  Procedure Laterality Date  . ABDOMINAL HYSTERECTOMY    . CHOLECYSTECTOMY    . PACEMAKER IMPLANT N/A 05/21/2017   Procedure: PACEMAKER IMPLANT;  Surgeon: Marinus Mawaylor, Gregg W, MD;  [facility];  Service: Cardiovascular;  Laterality: N/A;   HPI:  Nicole Marchrene Wohlgemuthis a 83 y.o.femalewith medical history significant forhypertension,who presentedto the emergency department for evaluation of confusion. She has hadconfusion for a year that has not previously been evaluated, but her son reports that this has worsened significantly in the past couple days. Patient admitted with AMS, worsening confusion. Found to have hypertensive urgency, acute stroke and AV block.MRI 05/21/17 showed left thalamic stroke. Pt passed intial RN swallow screen. Repeat CT ordered 12/21 due to changes in mentation, with no new findings. Swallow evaluation ordered.  Assessment / Plan / Recommendation Clinical Impression  Pt alert, pleasant but confused. Oriented to self, disoriented to location and reason for admission. Despite confusion pt is fully alert and following all commands, able to self-feed independently. Patient presents with oropharyngeal swallow which appears at bedside to be within functional limits with adequate airway protection. No overt signs of aspiration observed despite challenging with consecutive straw sips of thin liquids in excess of 3oz. Recommend regular diet with thin liquids. Will s/o for dysphagia. Given acute CVA and confusion, recommend SLP cognitive linguistic assessment.    SLP Visit Diagnosis: Dysphagia, unspecified (R13.10)    Aspiration Risk  Mild aspiration risk    Diet Recommendation Regular;Thin liquid   Liquid Administration via: Cup;Straw Medication Administration: Whole meds with liquid Supervision: Patient able to self feed Compensations: Minimize environmental distractions;Slow rate;Small sips/bites    Other  Recommendations Oral Care Recommendations: Oral care BID   Follow up Recommendations None      Frequency and Duration            Prognosis Prognosis for Safe Diet Advancement: Good Barriers to Reach Goals: Cognitive deficits      Swallow Study   General Date of Onset: 05/20/17 HPI: Nicole Marchrene Wohlgemuthis a 83 y.o.femalewith medical history significant forhypertension,who presentedto the emergency department for evaluation of confusion. She has hadconfusion for a year that has not previously been evaluated, but her son reports that this has worsened significantly in the past couple days. Patient admitted with AMS, worsening confusion. Found to have hypertensive urgency, acute stroke and AV block.MRI 05/21/17 showed left thalamic stroke. Pt passed intial RN swallow screen. Type of Study: Bedside Swallow Evaluation Previous Swallow Assessment: none in chart Diet Prior to this Study: Regular;Thin liquids Temperature Spikes Noted: No Respiratory Status: Room air History of Recent Intubation: No Behavior/Cognition: Alert;Confused Oral Cavity Assessment: Within Functional Limits Oral Care Completed by SLP: No Oral Cavity - Dentition: Adequate natural dentition Vision: Functional for self-feeding Self-Feeding Abilities: Able to feed self Patient Positioning: Upright in bed Baseline Vocal Quality: Normal Volitional Cough: Strong Volitional Swallow: Able to elicit    Oral/Motor/Sensory Function Overall Oral Motor/Sensory Function: Within functional limits   Ice Chips Ice chips: Not tested Other Comments: pt declined    Thin Liquid Thin Liquid: Within functional limits Presentation: Cup;Straw  Nectar Thick Nectar Thick Liquid: Not tested   Honey Thick Honey Thick Liquid: Not tested   Puree Puree: Within functional limits Presentation: Self Fed;Spoon   Solid   GO   Solid: Within functional limits Presentation: Self Fed       Rondel BatonMary Beth Reiana Poteet, MS, CCC-SLP Speech-Language Pathologist [phone]  Arlana LindauMary E Tarita Deshmukh 05/23/2017,2:03 PM

## 2017-05-23 NOTE — Evaluation (Signed)
Occupational Therapy Evaluation Patient Details Name: Nicole [name] MRN: [id number] DOB: Apr 15, 1929 Today's Date: 05/23/2017    History of Present Illness Patient is an 83 yo female admitted 05/20/17 with AMS, worsening confusion. In ED, patient found to have bradycardia, heart block, hypertensive urgency, and acute stroke - MRI of brain showed acute lacunar infarct of the anterior Lt thalamus; associated Severe Lt PCA stenosis.   PMH:  HTN, back/leg pain, Polio.   Clinical Impression   Pt admitted with above. She demonstrates the below listed deficits and will benefit from continued OT to maximize safety and independence with BADLs.  Pt presents to OT with impaired cognition, visual deficits, impaired balance, decreased activity tolerance.  She complains of dizziness when standing.  Currently, she requires min A - max A for ADLs, and mod  A for static standing.   PTA, she lived in apt of son's and daughter's In law home, and was mod I with ADLs - required assist with IADLs.  Family is very supportive.  Recommend post acute rehab at discharge.       Follow Up Recommendations  CIR;Supervision/Assistance - 24 hour    Equipment Recommendations  None recommended by OT    Recommendations for Other Services Rehab consult     Precautions / Restrictions Precautions Precautions: Fall Precaution Comments: Confusion Restrictions Weight Bearing Restrictions: No      Mobility Bed Mobility Overal bed mobility: Needs Assistance Bed Mobility: Supine to Sit;Sit to Supine     Supine to sit: Min assist;HOB elevated Sit to supine: Min guard   General bed mobility comments: Pt requires increased amount of time to move to EOB.  Mod verbal cues provided for problem solving.   She spontaneously returned to supine   Transfers Overall transfer level: Needs assistance   Transfers: Sit to/from Stand Sit to Stand: Mod assist         General transfer comment: Pt moved sit to stand with mod A  to steady.       Balance Overall balance assessment: Needs assistance Sitting-balance support: No upper extremity supported;Feet supported Sitting balance-Leahy Scale: Good Sitting balance - Comments: Pt able to lean forward to adjust socks while seated EOB    Standing balance support: Bilateral upper extremity supported Standing balance-Leahy Scale: Poor Standing balance comment: Requires bil. UE support and light mod A.  Pt with c/o dizziness when standing and promptly returned to supine.                             ADL either performed or assessed with clinical judgement   ADL Overall ADL's : Needs assistance/impaired Eating/Feeding: Minimal assistance;Sitting   Grooming: Brushing hair;Supervision/safety;Sitting   Upper Body Bathing: Minimal assistance;Sitting   Lower Body Bathing: Moderate assistance;Sit to/from stand   Upper Body Dressing : Minimal assistance;Sitting   Lower Body Dressing: Moderate assistance;Sit to/from stand   Toilet Transfer: Moderate assistance;Stand-pivot;BSC   Toileting- Clothing Manipulation and Hygiene: Moderate assistance;Sit to/from stand       Functional mobility during ADLs: Moderate assistance General ADL Comments: Pt requires mod A for balance      Vision Baseline Vision/History: Wears glasses Wears Glasses: Reading only Patient Visual Report: No change from baseline Vision Assessment?: Yes Ocular Range of Motion: Within Functional Limits Alignment/Gaze Preference: Within Defined Limits Tracking/Visual Pursuits: Decreased smoothness of horizontal tracking Additional Comments: Horizontal nystagmus noted with Lt gaze.   Pt able to read headlines in newpaper without difficulty, but unable  to read smaller print due not having reading glasses      Perception Perception Perception Tested?: Yes   Praxis      Pertinent Vitals/Pain Pain Assessment: No/denies pain     Hand Dominance Right   Extremity/Trunk Assessment  Upper Extremity Assessment Upper Extremity Assessment: Overall WFL for tasks assessed   Lower Extremity Assessment Lower Extremity Assessment: Defer to PT evaluation   Cervical / Trunk Assessment Cervical / Trunk Assessment: Normal   Communication Communication Communication: Expressive difficulties(Pt speaking in native language rather than English )   Cognition Arousal/Alertness: Awake/alert Behavior During Therapy: WFL for tasks assessed/performed Overall Cognitive Status: Impaired/Different from baseline Area of Impairment: Orientation;Attention;Memory;Following commands;Safety/judgement;Awareness;Problem solving                 Orientation Level: Disoriented to;Place;Time;Situation Current Attention Level: Sustained Memory: Decreased short-term memory Following Commands: Follows one step commands with increased time;Follows one step commands consistently Safety/Judgement: Decreased awareness of safety;Decreased awareness of deficits   Problem Solving: Slow processing;Difficulty sequencing;Requires verbal cues;Requires tactile cues     General Comments  son present throughout eval. all questions answered.  Discussed recommendation for 24 hour assist at discharge, as well as recommendation for post acute rehab     Exercises     Shoulder Instructions      Home Living Family/patient expects to be discharged to:: Private residence Living Arrangements: Children Available Help at Discharge: Family;Available 24 hours/day Type of Home: House Home Access: Stairs to enter Entergy CorporationEntrance Stairs-Number of Steps: 6 Entrance Stairs-Rails: Right;Left Home Layout: Two level;Able to live on main level with bedroom/bathroom     Bathroom Shower/Tub: Other (comment)(walk in tub )   Bathroom Toilet: Handicapped height     Home Equipment: Walker - 2 wheels;Cane - single point;Electric scooter;Shower seat - built in   Additional Comments: Pt has a downstairs apt in son's and  daughter's in law home .  She has hired caregiver assist 3-4 hours during the week       Prior Functioning/Environment Level of Independence: Independent with assistive device(s)        Comments: Uses RW for gait.  Patient independent with bathing, dressing, meal prep.  Family provided transportation. Son and daughter in law report pt with recent cognitive decline of ~5-6 mos.  They recently realized pt was not paying her bills, nor was she taking her meds appropriately.   They also report she has become somewhat sedentary         OT Problem List: Decreased strength;Decreased activity tolerance;Impaired balance (sitting and/or standing);Impaired vision/perception;Decreased cognition;Decreased safety awareness;Cardiopulmonary status limiting activity      OT Treatment/Interventions: Self-care/ADL training;DME and/or AE instruction;Therapeutic activities;Cognitive remediation/compensation;Visual/perceptual remediation/compensation;Patient/family education;Balance training    OT Goals(Current goals can be found in the care plan section) Acute Rehab OT Goals Patient Stated Goal: To maximize independence  OT Goal Formulation: With patient/family Time For Goal Achievement: 06/06/17 Potential to Achieve Goals: Good ADL Goals Pt Will Perform Grooming: with min guard assist;standing Pt Will Perform Upper Body Bathing: with supervision;sitting Pt Will Perform Lower Body Bathing: with min guard assist;sit to/from stand Pt Will Perform Upper Body Dressing: with set-up;with supervision Pt Will Perform Lower Body Dressing: with min guard assist;sit to/from stand Pt Will Transfer to Toilet: with min assist;ambulating;regular height toilet;bedside commode;grab bars Pt Will Perform Toileting - Clothing Manipulation and hygiene: with min guard assist  OT Frequency: Min 2X/week   Barriers to D/C:            Co-evaluation  AM-PAC PT "6 Clicks" Daily Activity     Outcome Measure  Help from another person eating meals?: A Little Help from another person taking care of personal grooming?: A Little Help from another person toileting, which includes using toliet, bedpan, or urinal?: A Lot Help from another person bathing (including washing, rinsing, drying)?: A Lot Help from another person to put on and taking off regular upper body clothing?: A Little Help from another person to put on and taking off regular lower body clothing?: A Lot 6 Click Score: 15   End of Session    Activity Tolerance: Patient limited by fatigue Patient left: in bed;with call bell/phone within reach;with family/visitor present  OT Visit Diagnosis: Unsteadiness on feet (R26.81);Cognitive communication deficit (R41.841) Symptoms and signs involving cognitive functions: Cerebral infarction                Time: 1524-1610 OT Time Calculation (min): 46 min Charges:  OT General Charges $OT Visit: 1 Visit OT Evaluation $OT Eval Moderate Complexity: 1 Mod OT Treatments $Therapeutic Activity: 23-37 mins G-Codes:     [employer], OTR/L [phone]   Jeani Hawking M 05/23/2017, 7:45 PM

## 2017-05-23 NOTE — Progress Notes (Signed)
EEG complete - results pending 

## 2017-05-23 NOTE — Procedures (Signed)
  ELECTROENCEPHALOGRAM REPORT  Date of Study: 05/23/17  Patient's Name: Nicole [name] MRN: [id number] Date of Birth: 1929-02-12  Referring Provider: Alba CoryBelkys A Regalado, MD  Clinical History: Nicole [name] is a 83 y.o. femalewith medical history significant forhypertension, who presented to the emergency department for evaluation of confusion. She has had confusion for a year that has not previously been evaluated, but her son reports that this has worsened significantly in the past couple days. Patient admitted with AMS, worsening confusion. Found to have hypertensive urgency, acute stroke and AV block.   Medications: Scheduled Meds: . aspirin  81 mg Oral Daily  . Chlorhexidine Gluconate Cloth  6 each Topical Daily  . enoxaparin (LOVENOX) injection  40 mg Subcutaneous Q24H  . lisinopril  20 mg Oral Daily  . mupirocin ointment  1 application Nasal BID  . pravastatin  40 mg Oral q1800  . sodium chloride flush  3 mL Intravenous Q12H   Continuous Infusions: . sodium chloride 100 mL/hr (05/23/17 0348)   PRN Meds:.acetaminophen, hydrALAZINE, HYDROcodone-acetaminophen, ondansetron (ZOFRAN) IV, ondansetron **OR** [DISCONTINUED] ondansetron (ZOFRAN) IV, senna-docusate            Technical Summary: This is a standard 16 channel EEG recording performed according to the international 10-20 electrode system.  AP bipolar, transverse bipolar, and referential montages were obtained, and digitally reformatted as necessary.  Duration of tracing: 26:52  Description: In the awake state there is a 6-7 Hz theta rhythm seen from the posterior head regions in a symmetric fashion, with intermittent eye blink artifact and superimposed beta rhythm in the background. Intermittent frontal, and at times generalized delta (1-2 Hz) discharges are seen.  Drowsiness is noted by vertex slowing, however no definite stage II sleep was identified.   Neither hyperventilation or photic stimulation were  performed.  EKG was monitored and noted to be sinus bradycardia with an average heart rate of 54 bpm.  No epileptiform changes were noted.  Impression: This is an abnormal EEG due to background slowing seen throughout the tracing.  This is a very non-specific finding that can be seen with toxic, metabolic, diffuse, or multifocal structural processes.  No definite epileptiform changes were noted.   A single EEG without epileptiform changes does not exclude the diagnosis of epilepsy. Clinical correlation advised.   Beryle Beamsobert Rianna Lukes, M.D. Neurology Cell [phone]) [phone]

## 2017-05-24 LAB — GLUCOSE, CAPILLARY
GLUCOSE-CAPILLARY: 107 mg/dL — AB (ref 65–99)
Glucose-Capillary: 115 mg/dL — ABNORMAL HIGH (ref 65–99)
Glucose-Capillary: 115 mg/dL — ABNORMAL HIGH (ref 65–99)
Glucose-Capillary: 154 mg/dL — ABNORMAL HIGH (ref 65–99)

## 2017-05-24 MED ORDER — HYDRALAZINE HCL 25 MG PO TABS
25.0000 mg | ORAL_TABLET | Freq: Three times a day (TID) | ORAL | Status: DC
  Administered 2017-05-24 (×2): 25 mg via ORAL
  Filled 2017-05-24 (×2): qty 1

## 2017-05-24 MED ORDER — HYDRALAZINE HCL 50 MG PO TABS
50.0000 mg | ORAL_TABLET | Freq: Three times a day (TID) | ORAL | Status: DC
Start: 2017-05-24 — End: 2017-05-27
  Administered 2017-05-24 – 2017-05-27 (×9): 50 mg via ORAL
  Filled 2017-05-24 (×9): qty 1

## 2017-05-24 NOTE — Progress Notes (Signed)
Rehab Admissions Coordinator Note:  Patient was screened by Clois DupesBoyette, Shaleen Talamantez Godwin for appropriateness for an Inpatient Acute Rehab Consult per OT recommendation.   At this time, we are recommending Inpatient Rehab consult. Please place order for consult.  Clois DupesBoyette, Makinze Jani Godwin 05/24/2017, 2:40 PM  I can be reached at [phone].

## 2017-05-24 NOTE — Progress Notes (Signed)
PROGRESS NOTE    Nicole [name]  NWG:[id number] DOB: August 10, 1928 DOA: 05/20/2017 PCP: System, Pcp Not In    Brief Narrative: Nicole [name] is a 83 y.o. female with medical history significant for hypertension, now presenting to the emergency department for evaluation of confusion.  History was obtained through family, discussion with ED personnel, and review of the EMR.  She has reportedly been having some difficulties with confusion for a year that has not previously been evaluated, but her son reports that this has worsened significantly in the past couple days.  Patient is not expressing any specific complaints.  There was no recent fall or trauma and she is not known to use illicit substances or alcohol.  She has not had a PCP and her medications ran out in March 2018.  [facility] ED Course: Upon arrival to the ED, patient is found to be afebrile, saturating well on room air, bradycardic in the 40s, and hypertensive with systolic pressures in the 200s.  EKG features a bradycardia with rate 42 and apparent Mobitz type II.  Noncontrast head CT is negative for acute intracranial abnormality.  Chemistry panel and CBC are unremarkable and troponin is undetectable.  Urine is not particularly suggestive of infection and sample was sent for culture.  Patient was treated with Norvasc and lisinopril in the emergency department and given prn doses of hydralazine.  Blood pressure improved, heart rate remained in the 40s, and there has not been any apparent respiratory distress.  Cardiology was consulted by the ED physician and recommended medical admission to [facility] for possible pacemaker.  Will be admitted to the stepdown unit and [facility] for ongoing evaluation and management of heart block, hypertensive urgency, and confusion.  Patient admitted with AMS, worsening confusion. Found to have hypertensive urgency, acute stroke and AV block. Neurology and EP has been helping   With patient management.    Assessment & Plan:   Principal Problem:   Heart block Active Problems:   Hypertensive urgency   Memory deficit   Acute encephalopathy   Mobitz type 2 second degree heart block   Cerebral thrombosis with cerebral infarction   Pacemaker  1-Bradycardia, Mobitz type 2 block.  TSH normal.  ECHO EF 60 % diastolic dysfunction grade 2.  Mg normal.  Cardiology consulted. Patient was not able to tolerates pacemake implantation 12-20. She will need general anesthesia.  Per Dr Ladona Ridgel hold on getting pacemaker until MS improved.  Could use temporary wire if AV block get worse.  2-Acute thalamic stroke.  Acute encephalopathy, likely hypertensive MRI positive for thalamic stroke.  Neurology consulted. Started on aspirin and statins.  B 12 normal. LDL 142.  -Mental status improved this morning, alert , answering some questions, but still not at her baseline, son at bedside. -EEG shows metabolic encephalopathy without any active seizures or focus.  3- Hypertension with hypertensive urgency  Continue with hydralazine PRN.  Added hydralazine Continue  with  lisinopril.  Hold Norvasc to avoid hypotension. Will follow trend of BP today. BP goal 160. BP on admission 230.   4-Urine retention;  Had In and Out. If retention persist she will need foley catheter.   DVT prophylaxis: Lovenox Code Status: full code.  Family Communication: Son at bedside. Disposition Plan: remain inpatient.    Consultants:   Cardiology  Neurology    Procedures:   ECHO   Antimicrobials: none   Subjective: Alert, following commands, no nausea no vomiting no abdominal pain.   Objective:  Vitals:   05/24/17 0500 05/24/17 0857 05/24/17 1200 05/24/17 1504  BP:  (!) 181/55 (!) 184/47 (!) 176/55  Pulse:      Resp: 18 17    Temp: 97.8 F (36.6 C) (!) 97.5 F (36.4 C) 98.5 F (36.9 C)   TempSrc: Oral Axillary Oral   SpO2: 98%     Weight:        Intake/Output Summary  (Last 24 hours) at 05/24/2017 1606 Last data filed at 05/24/2017 1500 Gross per 24 hour  Intake 4056.67 ml  Output 3700 ml  Net 356.67 ml   Filed Weights   05/20/17 2302 05/21/17 0325 05/23/17 0530  Weight: 59.7 kg (131 lb 9.6 oz) 59.4 kg (131 lb) 63.2 kg (139 lb 5.3 oz)    Examination:  General exam: Alert, sitting at bedside commode.  Respiratory system:  Normal respiratory effort, CTA Cardiovascular system: S 1, S 2 RRR, no edema Gastrointestinal system: BS present, soft, nt Central nervous system:  Alert, follows some command.  Extremities: no edema     Data Reviewed: I have personally reviewed following labs and imaging studies  CBC: Recent Labs  Lab 05/20/17 1220 05/21/17 0225 05/21/17 1922 05/23/17 0819  WBC 7.2  --  14.2* 9.8  NEUTROABS 5.5  --   --   --   HGB 12.1  --  13.2 11.7*  HCT 37.3 38.7 41.0 36.8  MCV 87.4  --  87.8 88.7  PLT 230  --  213 220   Basic Metabolic Panel: Recent Labs  Lab 05/20/17 1220 05/21/17 0225 05/21/17 1922 05/22/17 1203 05/23/17 0819  NA 142 143  --  139 141  K 3.6 3.4*  --  3.6 3.3*  CL 108 109  --  108 111  CO2 28 28  --  22 24  GLUCOSE 96 113*  --  105* 111*  BUN 22* 15  --  17 16  CREATININE 1.01* 0.88 0.92 1.00 0.91  CALCIUM 9.0 9.2  --  9.1 8.5*  MG  --  2.0  --   --   --    GFR: CrCl cannot be calculated (Unknown ideal weight.). Liver Function Tests: Recent Labs  Lab 05/20/17 1220  AST 14*  ALT 14  ALKPHOS 76  BILITOT 0.5  PROT 6.4*  ALBUMIN 3.6   No results for input(s): LIPASE, AMYLASE in the last 168 hours. Recent Labs  Lab 05/21/17 0225  AMMONIA 13   Coagulation Profile: No results for input(s): INR, PROTIME in the last 168 hours. Cardiac Enzymes: Recent Labs  Lab 05/20/17 1220 05/21/17 0225 05/21/17 0700 05/21/17 1414  TROPONINI <0.03 <0.03 <0.03 0.03*   BNP (last 3 results) No results for input(s): PROBNP in the last 8760 hours. HbA1C: Recent Labs    05/22/17 0900  HGBA1C  5.7*   CBG: Recent Labs  Lab 05/22/17 1559 05/24/17 0751 05/24/17 1127  GLUCAP 114* 115* 115*   Lipid Profile: Recent Labs    05/22/17 0842  CHOL 215*  HDL 37*  LDLCALC 142*  TRIG 179*  CHOLHDL 5.8   Thyroid Function Tests: No results for input(s): TSH, T4TOTAL, FREET4, T3FREE, THYROIDAB in the last 72 hours. Anemia Panel: No results for input(s): VITAMINB12, FOLATE, FERRITIN, TIBC, IRON, RETICCTPCT in the last 72 hours. Sepsis Labs: No results for input(s): PROCALCITON, LATICACIDVEN in the last 168 hours.  Recent Results (from the past 240 hour(s))  Urine culture     Status: Abnormal   Collection Time: 05/20/17  2:45 PM  Result Value Ref Range Status   Specimen Description URINE, CLEAN CATCH  Final   Special Requests NONE  Final   Culture MULTIPLE SPECIES PRESENT, SUGGEST RECOLLECTION (A)  Final   Report Status 05/21/2017 FINAL  Final  MRSA PCR Screening     Status: None   Collection Time: 05/20/17 11:50 PM  Result Value Ref Range Status   MRSA by PCR NEGATIVE NEGATIVE Final    Comment:        The GeneXpert MRSA Assay (FDA approved for NASAL specimens only), is one component of a comprehensive MRSA colonization surveillance program. It is not intended to diagnose MRSA infection nor to guide or monitor treatment for MRSA infections.   Surgical PCR screen     Status: Abnormal   Collection Time: 05/21/17  2:24 PM  Result Value Ref Range Status   MRSA, PCR NEGATIVE NEGATIVE Final   Staphylococcus aureus POSITIVE (A) NEGATIVE Final    Comment: (NOTE) The Xpert SA Assay (FDA approved for NASAL specimens in patients 81 years of age and older), is one component of a comprehensive surveillance program. It is not intended to diagnose infection nor to guide or monitor treatment.          Radiology Studies: Ct Head Wo Contrast  Result Date: 05/22/2017 CLINICAL DATA:  Stroke, follow-up, change in mental status today. EXAM: CT HEAD WITHOUT CONTRAST TECHNIQUE:  Contiguous axial images were obtained from the base of the skull through the vertex without intravenous contrast. COMPARISON:  Head CT dated 05/20/2017.  Brain MRI dated 05/21/2017. FINDINGS: Brain: Again noted is generalized parenchymal atrophy with commensurate dilatation of the ventricles and sulci, stable. Chronic small vessel ischemic changes are again seen throughout the bilateral periventricular and subcortical white matter regions, stable. Low-density focus in the left thalamus is compatible with focal edema related to the acute infarct demonstrated on yesterday's brain MRI, stable in size. There are no new parenchymal findings. No parenchymal or extra-axial hemorrhage. Vascular: There are chronic calcified atherosclerotic changes of the large vessels at the skull base. No unexpected hyperdense vessel. Skull: Normal. Negative for fracture or focal lesion. Sinuses/Orbits: No acute finding. Other: None. IMPRESSION: No new intracranial abnormality. No intracranial hemorrhage. No mass effect, midline shift or herniation. Small focus of edema within the left thalamus, corresponding to the acute lacunar infarct described on yesterday's MRI. Chronic small vessel ischemic changes in the white matter. Electronically Signed   By: Bary RichardStan  Maynard M.D.   On: 05/22/2017 16:47        Scheduled Meds: . aspirin  81 mg Oral Daily  . Chlorhexidine Gluconate Cloth  6 each Topical Daily  . enoxaparin (LOVENOX) injection  40 mg Subcutaneous Q24H  . hydrALAZINE  25 mg Oral Q8H  . lisinopril  20 mg Oral Daily  . mupirocin ointment  1 application Nasal BID  . pravastatin  40 mg Oral q1800  . sodium chloride flush  3 mL Intravenous Q12H   Continuous Infusions: . sodium chloride 100 mL/hr at 05/24/17 0918     LOS: 4 days    Time spent: 35 minutes.    Author:  Lynden OxfordPranav Patel, MD [facility] Pager: [phone] 05/24/2017 4:24 PM     If 7PM-7AM, please contact night-coverage [email or web address] Password  [email or web address] 05/24/2017, 4:06 PM

## 2017-05-24 NOTE — Progress Notes (Signed)
Patient pulled IV out and a new one has not been placed because she is confused, agitated and restless.  She has been awake most of the night. She has been in and out of bed to void several times. One time dose Haldol 2 mg administered per order was not effective.  She is currently in bed with eyes closed.  Will continue to monitor.

## 2017-05-25 DIAGNOSIS — I6330 Cerebral infarction due to thrombosis of unspecified cerebral artery: Secondary | ICD-10-CM

## 2017-05-25 DIAGNOSIS — R413 Other amnesia: Secondary | ICD-10-CM

## 2017-05-25 LAB — BASIC METABOLIC PANEL
ANION GAP: 6 (ref 5–15)
Anion gap: 6 (ref 5–15)
BUN: 9 mg/dL (ref 6–20)
BUN: 10 mg/dL (ref 6–20)
CALCIUM: 8.7 mg/dL — AB (ref 8.9–10.3)
CHLORIDE: 111 mmol/L (ref 101–111)
CO2: 25 mmol/L (ref 22–32)
CO2: 22 mmol/L (ref 22–32)
CREATININE: 0.8 mg/dL (ref 0.44–1.00)
Calcium: 8.8 mg/dL — ABNORMAL LOW (ref 8.9–10.3)
Chloride: 114 mmol/L — ABNORMAL HIGH (ref 101–111)
Creatinine, Ser: 0.8 mg/dL (ref 0.44–1.00)
GFR calc Af Amer: >60 mL/min (ref >60)
GFR calc non Af Amer: >60 mL/min (ref >60)
GLUCOSE: 108 mg/dL — AB (ref 65–99)
Glucose, Bld: 103 mg/dL — ABNORMAL HIGH (ref 65–99)
POTASSIUM: 2.8 mmol/L — AB (ref 3.5–5.1)
Potassium: 3.8 mmol/L (ref 3.5–5.1)
SODIUM: 142 mmol/L (ref 135–145)
Sodium: 142 mmol/L (ref 135–145)

## 2017-05-25 LAB — CBC WITH DIFFERENTIAL/PLATELET
BASOS ABS: 0 10*3/uL (ref 0.0–0.1)
Basophils Relative: 0 %
Eosinophils Absolute: 0.3 10*3/uL (ref 0.0–0.7)
Eosinophils Relative: 3 %
HEMATOCRIT: 34.2 % — AB (ref 36.0–46.0)
Hemoglobin: 11.3 g/dL — ABNORMAL LOW (ref 12.0–15.0)
LYMPHS PCT: 17 %
Lymphs Abs: 1.5 10*3/uL (ref 0.7–4.0)
MCH: 28.7 pg (ref 26.0–34.0)
MCHC: 33 g/dL (ref 30.0–36.0)
MCV: 86.8 fL (ref 78.0–100.0)
MONO ABS: 0.5 10*3/uL (ref 0.1–1.0)
Monocytes Relative: 6 %
NEUTROS ABS: 6.7 10*3/uL (ref 1.7–7.7)
Neutrophils Relative %: 74 %
Platelets: 236 10*3/uL (ref 150–400)
RBC: 3.94 MIL/uL (ref 3.87–5.11)
RDW: 14.8 % (ref 11.5–15.5)
WBC: 9 10*3/uL (ref 4.0–10.5)

## 2017-05-25 LAB — MAGNESIUM
MAGNESIUM: 1.8 mg/dL (ref 1.7–2.4)
Magnesium: 1.7 mg/dL (ref 1.7–2.4)

## 2017-05-25 MED ORDER — LISINOPRIL 40 MG PO TABS
40.0000 mg | ORAL_TABLET | Freq: Every day | ORAL | Status: DC
  Administered 2017-05-26 – 2017-05-27 (×2): 40 mg via ORAL
  Filled 2017-05-25 (×2): qty 1

## 2017-05-25 MED ORDER — LISINOPRIL 20 MG PO TABS
20.0000 mg | ORAL_TABLET | Freq: Once | ORAL | Status: AC
  Administered 2017-05-25: 20 mg via ORAL
  Filled 2017-05-25: qty 1

## 2017-05-25 MED ORDER — POTASSIUM CHLORIDE CRYS ER 20 MEQ PO TBCR
40.0000 meq | EXTENDED_RELEASE_TABLET | ORAL | Status: AC
  Administered 2017-05-25 (×2): 40 meq via ORAL
  Filled 2017-05-25 (×2): qty 2

## 2017-05-25 MED ORDER — ZOLPIDEM TARTRATE 5 MG PO TABS: 5.0000 mg | ORAL_TABLET | Freq: Every evening | ORAL | Status: DC | PRN

## 2017-05-25 NOTE — Evaluation (Addendum)
Speech Language Pathology Evaluation Patient Details Name: Nicole [name] MRN: [id number] DOB: [birth year] Today's Date: 05/25/2017 Time: [id number] SLP Time Calculation (min) (ACUTE ONLY): 20 min  Problem List:  Patient Active Problem List   Diagnosis Date Noted  . Cerebral thrombosis with cerebral infarction 05/22/2017  . Pacemaker   . Heart block 05/21/2017  . Memory deficit 05/21/2017  . Acute encephalopathy 05/21/2017  . Mobitz type 2 second degree heart block   . Hypertensive urgency 05/20/2017   Past Medical History:  Past Medical History:  Diagnosis Date  . Hypertension   . Polio    Past Surgical History:  Past Surgical History:  Procedure Laterality Date  . ABDOMINAL HYSTERECTOMY    . CHOLECYSTECTOMY    . PACEMAKER IMPLANT N/A 05/21/2017   Procedure: PACEMAKER IMPLANT;  Surgeon: Marinus Mawaylor, Gregg W, MD;  [facility];  Service: Cardiovascular;  Laterality: N/A;   HPI:  Nicole Marchrene Wohlgemuthis a 83 y.o.femalewith medical history significant forhypertension,who presentedto the emergency department for evaluation of confusion. She has hadconfusion for a year that has not previously been evaluated, but her son reports that this has worsened significantly in the past couple days. Patient admitted with AMS, worsening confusion. Found to have hypertensive urgency, acute stroke and AV block.MRI 05/21/17 showed left thalamic stroke. Pt passed intial RN swallow screen.   Assessment / Plan / Recommendation Clinical Impression  Patient presents with cognitive communication impairment and impaired verbal expression. Pt's son present and reports pt did have some baseline memory issues. He is noting significantly decreased verbal output since her stroke. Pt responds appropriately to questions, with some hesitations, wordfinding issues noted. She speaks [place] and [place]. Delayed recall 0/3; she is disoriented to date, location and situation. Recommend skilled ST to  address the above impairments. Would benefit from CIR.    SLP Assessment  SLP Recommendation/Assessment: Patient needs continued Speech Lanaguage Pathology Services SLP Visit Diagnosis: Aphasia (R47.01);Cognitive communication deficit (R41.841)    Follow Up Recommendations  Inpatient Rehab    Frequency and Duration min 1 x/week  1 week      SLP Evaluation Cognition  Overall Cognitive Status: Impaired/Different from baseline Arousal/Alertness: Awake/alert Orientation Level: Oriented to person;Disoriented to place;Disoriented to time;Disoriented to situation Attention: Focused;Sustained;Selective Focused Attention: Appears intact Sustained Attention: Appears intact Selective Attention: Impaired Selective Attention Impairment: Verbal basic;Functional basic Memory: Impaired Memory Impairment: Storage deficit;Decreased recall of new information;Decreased short term memory Decreased Short Term Memory: Verbal basic;Functional basic Awareness: Impaired Awareness Impairment: Emergent impairment Problem Solving: Impaired Problem Solving Impairment: Verbal basic;Functional basic Safety/Judgment: Impaired       Comprehension  Auditory Comprehension Overall Auditory Comprehension: Appears within functional limits for tasks assessed Visual Recognition/Discrimination Discrimination: Within Function Limits Reading Comprehension Reading Status: Not tested    Expression Expression Primary Mode of Expression: Verbal Verbal Expression Overall Verbal Expression: Impaired Initiation: Impaired Automatic Speech: Name Level of Generative/Spontaneous Verbalization: Sentence Repetition: Impaired Level of Impairment: Phrase level Naming: Impairment Pragmatics: No impairment Other Verbal Expression Comments: son reports decreased verbal output Written Expression Dominant Hand: Right   Oral / Motor  Oral Motor/Sensory Function Overall Oral Motor/Sensory Function: Within functional  limits Motor Speech Overall Motor Speech: Appears within functional limits for tasks assessed   GO                   Nicole BatonMary Beth Keonta Alsip, MS, CCC-SLP Speech-Language Pathologist [phone]  Arlana LindauMary E Celene [name] 05/25/2017, 12:53 PM

## 2017-05-25 NOTE — Progress Notes (Signed)
Inpatient Rehabilitation  Met with patient and son at bedside to discuss team's recommendation for IP Rehab.  Shared booklets, insurance verification letter, and answered questions.  Son is eager for her to participate in the program; however, patient decline bed offer stating that she just wants to go home.  Family wishes to meet and further discuss options.  If patient remains in house then my co-worker Karene Fry will follow up 12/26.  Call if questions.   Carmelia Roller., CCC/SLP Admission Coordinator  [facility]  Cell [phone]

## 2017-05-25 NOTE — Progress Notes (Signed)
Occupational Therapy Treatment Patient Details Name: Nicole [name] MRN: [id number] DOB: 1929/02/06 Today's Date: 05/25/2017    History of present illness Patient is an 83 yo admitted 05/20/17 with AMS, worsening confusion. In ED, patient found to have bradycardia, heart block, hypertensive urgency, and acute stroke - MRI of brain showed acute lacunar infarct of the anterior Lt thalamus; associated Severe Lt PCA stenosis.   PMH:  HTN, back/leg pain, Polio.   OT comments  Pt with progression of mobility and improved endurance. Continues to demonstrate sustained attention and inattention to R hemispace. Pt benefits from minimal environmental distractions during activity. Pt needing cues to avoid using toothpaste for hand soap. Pt repeatedly releasing walker and reaching for furniture as was her habit prior to admission.   Follow Up Recommendations  CIR;Supervision/Assistance - 24 hour    Equipment Recommendations       Recommendations for Other Services      Precautions / Restrictions Precautions Precautions: Fall Precaution Comments: Confusion Restrictions Weight Bearing Restrictions: No       Mobility Bed Mobility               General bed mobility comments: pt received in chair  Transfers Overall transfer level: Needs assistance Equipment used: Rolling walker (2 wheeled) Transfers: Sit to/from Stand Sit to Stand: Mod assist;+2 safety/equipment         General transfer comment: pt attempted sit to stand a couple of times without assistance and tried pushing from chair as well as with hands on RW. Could not get wt shifted fwd and noted confusion with the task. Min A +2 for fwd wt shift and steadying as well as vc's for hand placement with RW    Balance Overall balance assessment: Needs assistance Sitting-balance support: No upper extremity supported;Feet supported Sitting balance-Leahy Scale: Good Sitting balance - Comments: could bring self to edge of chair  without LOB   Standing balance support: Single extremity supported Standing balance-Leahy Scale: Poor Standing balance comment: pt able to maintain static balance at sink to wash hands                            ADL either performed or assessed with clinical judgement   ADL Overall ADL's : Needs assistance/impaired Eating/Feeding: Independent;Sitting Eating/Feeding Details (indicate cue type and reason): opens all containers independently Grooming: Wash/dry hands;Standing;Minimal assistance Grooming Details (indicate cue type and reason): pt attempting to use toothbrush for soap         Upper Body Dressing : Minimal assistance;Sitting Upper Body Dressing Details (indicate cue type and reason): front opening gown                 Functional mobility during ADLs: Moderate assistance;+2 for safety/equipment;Rolling walker;Cueing for safety       Vision   Additional Comments: pt repeatedly running in to objects on the R during ambulation, able to locate visual targets in R hemispace, but demonstrating inattention   Perception     Praxis      Cognition Arousal/Alertness: Awake/alert Behavior During Therapy: Flat affect Overall Cognitive Status: Impaired/Different from baseline Area of Impairment: Orientation;Attention;Memory;Following commands;Safety/judgement;Awareness;Problem solving                   Current Attention Level: Sustained Memory: Decreased short-term memory Following Commands: Follows one step commands with increased time;Follows one step commands consistently Safety/Judgement: Decreased awareness of safety;Decreased awareness of deficits   Problem Solving: Slow processing;Difficulty sequencing;Requires verbal cues;Requires  tactile cues General Comments: Pt easily distracted by objects in environment as well as conversation, difficulty remaining focused on task. Decreased attention to R side. Apraxia noted with transitions in mobility         Exercises     Shoulder Instructions       General Comments son has been assisting mom with eating meals but was able to feed self when sitting up in chair and cued    Pertinent Vitals/ Pain       Pain Assessment: Faces Faces Pain Scale: No hurt  Home Living     Available Help at Discharge: Family;Available 24 hours/day Type of Home: House                                  Prior Functioning/Environment              Frequency  Min 2X/week        Progress Toward Goals  OT Goals(current goals can now be found in the care plan section)  Progress towards OT goals: Progressing toward goals  Acute Rehab OT Goals Patient Stated Goal: To maximize independence  OT Goal Formulation: With patient/family Time For Goal Achievement: 06/06/17 Potential to Achieve Goals: Good  Plan Discharge plan remains appropriate    Co-evaluation    PT/OT/SLP Co-Evaluation/Treatment: Yes Reason for Co-Treatment: Necessary to address cognition/behavior during functional activity;For patient/therapist safety PT goals addressed during session: Mobility/safety with mobility;Balance;Proper use of DME OT goals addressed during session: ADL's and self-care      AM-PAC PT "6 Clicks" Daily Activity     Outcome Measure   Help from another person eating meals?: None Help from another person taking care of personal grooming?: A Little Help from another person toileting, which includes using toliet, bedpan, or urinal?: A Lot Help from another person bathing (including washing, rinsing, drying)?: A Lot Help from another person to put on and taking off regular upper body clothing?: A Little Help from another person to put on and taking off regular lower body clothing?: A Lot 6 Click Score: 16    End of Session Equipment Utilized During Treatment: Gait belt;Rolling walker  OT Visit Diagnosis: Unsteadiness on feet (R26.81);Cognitive communication deficit (R41.841) Symptoms  and signs involving cognitive functions: Cerebral infarction   Activity Tolerance Patient tolerated treatment well   Patient Left in chair;with call bell/phone within reach;with family/visitor present   Nurse Communication          Time: [id number] OT Time Calculation (min): 28 min  Charges: OT General Charges $OT Visit: 1 Visit OT Treatments $Self Care/Home Management : 8-22 mins  05/25/2017 Martie RoundJulie Altair Appenzeller, OTR/L Pager: [phone]   Iran PlanasMayberry, [place] BailiffJulie Lynn 05/25/2017, 1:40 PM

## 2017-05-25 NOTE — Progress Notes (Signed)
Physical Therapy Treatment Patient Details Name: Nicole [name] MRN: [id number] DOB: 23-Nov-1928 Today's Date: 05/25/2017    History of Present Illness Patient is an 83 yo female admitted 05/20/17 with AMS, worsening confusion. In ED, patient found to have bradycardia, heart block, hypertensive urgency, and acute stroke - MRI of brain showed acute lacunar infarct of the anterior Lt thalamus; associated Severe Lt PCA stenosis.   PMH:  HTN, back/leg pain, Polio.    PT Comments    Pt progressing with mobility, ambulated 8480' with RW and min A +2. Began with R lean and running into all obstacles in R side, improved with distance and frequent instructional cues. However, pt with minimal verbalization throughout session and son concerned for depression. Pt would benefit greatly from CIR . PT will continue to follow.    Follow Up Recommendations  CIR;Supervision/Assistance - 24 hour     Equipment Recommendations  None recommended by PT    Recommendations for Other Services Rehab consult     Precautions / Restrictions Precautions Precautions: Fall Precaution Comments: Confusion Restrictions Weight Bearing Restrictions: No    Mobility  Bed Mobility               General bed mobility comments: pt received in chair  Transfers Overall transfer level: Needs assistance Equipment used: Rolling walker (2 wheeled) Transfers: Sit to/from Stand           General transfer comment: pt attempted sit to stand a couple of times without assistance and tried pushing from chair as well as with hands on RW. Could not get wt shifted fwd and noted confusion with the task. Min A +2 for fwd wt shift and steadying as well as vc's for hand placement with RW  Ambulation/Gait Ambulation/Gait assistance: Min assist;+2 safety/equipment Ambulation Distance (Feet): 80 Feet Assistive device: Rolling walker (2 wheeled) Gait Pattern/deviations: Step-through pattern;Trunk flexed(drift R) Gait velocity:  decreased Gait velocity interpretation: <1.8 ft/sec, indicative of risk for recurrent falls General Gait Details: began ambulation with R lean and running into objects on R side, needed frequent vc's to attend to obstacles on R. Also frequently let go of RW to grasp furniture within room. Son reports that she did this PTA and was the cause of some of her falls. Min A from behind for support as well as min A from front to guide RW at times. Pt maintained trunk flexion throughout with some improvement when cued to step into RW. Son reports she ambulated with flexed trunk PTA as well. R lean improved with distance. Pt began to fatigue after ~60'.    Stairs            Wheelchair Mobility    Modified Rankin (Stroke Patients Only) Modified Rankin (Stroke Patients Only) Pre-Morbid Rankin Score: Slight disability Modified Rankin: Moderately severe disability     Balance Overall balance assessment: Needs assistance Sitting-balance support: No upper extremity supported;Feet supported Sitting balance-Leahy Scale: Good Sitting balance - Comments: could bring self to edge of chair without LOB   Standing balance support: Single extremity supported Standing balance-Leahy Scale: Poor Standing balance comment: pt able to maintain balance at sink with single support                             Cognition Arousal/Alertness: Awake/alert Behavior During Therapy: Flat affect Overall Cognitive Status: Impaired/Different from baseline Area of Impairment: Orientation;Attention;Memory;Following commands;Safety/judgement;Awareness;Problem solving  Current Attention Level: Sustained Memory: Decreased short-term memory Following Commands: Follows one step commands with increased time;Follows one step commands consistently Safety/Judgement: Decreased awareness of safety;Decreased awareness of deficits   Problem Solving: Slow processing;Difficulty sequencing;Requires  verbal cues;Requires tactile cues General Comments: Pt easily distracted by objects in environment as well as conversation, difficulty remaining focused on task. Decreased attention to R side. Apraxia noted with transitions in mobility      Exercises      General Comments General comments (skin integrity, edema, etc.): son has been assisting mom with eating meals but was able to feed self when sitting up in chair and cued      Pertinent Vitals/Pain Pain Assessment: No/denies pain    Home Living     Available Help at Discharge: (P) Family;Available 24 hours/day Type of Home: (P) House              Prior Function            PT Goals (current goals can now be found in the care plan section) Acute Rehab PT Goals Patient Stated Goal: To maximize independence  PT Goal Formulation: With patient/family Time For Goal Achievement: 05/30/17 Potential to Achieve Goals: Fair Progress towards PT goals: Progressing toward goals    Frequency    Min 4X/week      PT Plan Discharge plan needs to be updated    Co-evaluation PT/OT/SLP Co-Evaluation/Treatment: Yes Reason for Co-Treatment: Necessary to address cognition/behavior during functional activity PT goals addressed during session: Mobility/safety with mobility;Balance;Proper use of DME        AM-PAC PT "6 Clicks" Daily Activity  Outcome Measure  Difficulty turning over in bed (including adjusting bedclothes, sheets and blankets)?: None Difficulty moving from lying on back to sitting on the side of the bed? : Unable Difficulty sitting down on and standing up from a chair with arms (e.g., wheelchair, bedside commode, etc,.)?: Unable Help needed moving to and from a bed to chair (including a wheelchair)?: A Little Help needed walking in hospital room?: A Lot Help needed climbing 3-5 steps with a railing? : A Lot 6 Click Score: 13    End of Session Equipment Utilized During Treatment: Gait belt Activity Tolerance:  Patient tolerated treatment well Patient left: in chair;with call bell/phone within reach;with family/visitor present Nurse Communication: Mobility status PT Visit Diagnosis: Other abnormalities of gait and mobility (R26.89);Muscle weakness (generalized) (M62.81);Other symptoms and signs involving the nervous system (R29.898)     Time: [id number] PT Time Calculation (min) (ACUTE ONLY): 27 min  Charges:  $Gait Training: 8-22 mins                    G Codes:       Lyanne CoVictoria Shone Leventhal, PT  Acute Rehab Services  [phone]    Lawana ChambersVictoria L Akacia Boltz 05/25/2017, 12:48 PM

## 2017-05-25 NOTE — Progress Notes (Signed)
PROGRESS NOTE    Arriana Lohmann  AVW:[id number] DOB: 1928/06/17 DOA: 05/20/2017 PCP: System, Pcp Not In    Brief Narrative: Nicole [name] is a 83 y.o. female with medical history significant for hypertension, now presenting to the emergency department for evaluation of confusion.  History was obtained through family, discussion with ED personnel, and review of the EMR.  She has reportedly been having some difficulties with confusion for a year that has not previously been evaluated, but her son reports that this has worsened significantly in the past couple days.  Patient is not expressing any specific complaints.  There was no recent fall or trauma and she is not known to use illicit substances or alcohol.  She has not had a PCP and her medications ran out in March 2018.  [facility] ED Course: Upon arrival to the ED, patient is found to be afebrile, saturating well on room air, bradycardic in the 40s, and hypertensive with systolic pressures in the 200s.  EKG features a bradycardia with rate 42 and apparent Mobitz type II.  Noncontrast head CT is negative for acute intracranial abnormality.  Chemistry panel and CBC are unremarkable and troponin is undetectable.  Urine is not particularly suggestive of infection and sample was sent for culture.  Patient was treated with Norvasc and lisinopril in the emergency department and given prn doses of hydralazine.  Blood pressure improved, heart rate remained in the 40s, and there has not been any apparent respiratory distress.  Cardiology was consulted by the ED physician and recommended medical admission to [facility] for possible pacemaker.  Will be admitted to the stepdown unit and [facility] for ongoing evaluation and management of heart block, hypertensive urgency, and confusion.  Patient admitted with AMS, worsening confusion. Found to have hypertensive urgency, acute stroke and AV block. Neurology and EP has been helping   With patient management.    Assessment & Plan:   Principal Problem:   Heart block Active Problems:   Hypertensive urgency   Memory deficit   Acute encephalopathy   Mobitz type 2 second degree heart block   Cerebral thrombosis with cerebral infarction   Pacemaker  1-Bradycardia, Mobitz type 2 block.  TSH normal.  ECHO EF 60 % diastolic dysfunction grade 2.  Mg normal.  Cardiology consulted. Patient was not able to tolerates pacemake implantation 12-20. She will need general anesthesia.  Per Dr Ladona Ridgel hold on pacemaker now, he would like to evaluate the patient as an outpatient, felt that the patient does not require acute pacemaker implant right now.  2-Acute thalamic stroke.  Acute encephalopathy, likely hypertensive MRI positive for thalamic stroke.  Neurology consulted. Started on aspirin and statins.  B 12 normal. LDL 142.  -Mental status improved this morning, alert , answering some questions, but still not at her baseline, son at bedside. -EEG shows metabolic encephalopathy without any active seizures or focus.  3- Hypertension with hypertensive urgency  Presented with significantly high blood pressure on admission. Found to have acute CVA therefore was required to have permissive hypertension. Now the patient is out of the window.  For permissive hypertension and therefore will start lowering the blood pressure. Started on IV as needed hydralazine, oral hydralazine 25 mg which has been increased to 50 mg 3 times daily. Patient was already on lisinopril 20 mg which I will increase to 40 mg daily starting today. Monitor.  4-Urine retention;  Resolved right now.  5.  Hypokalemia. Replacing oral, radial recheck one  more time today.  6.  Insomnia. At Ambien.  DVT prophylaxis: Lovenox Code Status: full code.  Family Communication: Son at bedside. Disposition Plan: remain inpatient.  Inpatient rehab will not be able to take the patient until May 27, 2017 since  they are closed on Christmas.   Consultants:   Cardiology  Neurology    Procedures:   ECHO   Antimicrobials: none   Subjective: Alert, following commands, no nausea no vomiting no abdominal pain.  No dizziness or lightheadedness. Initially refused to go to CIR now agreeable as well as agreeable to participate in therapy.  Did not sleep good last night.   Objective: Vitals:   05/25/17 1133 05/25/17 1429 05/25/17 1554 05/25/17 1600  BP: (!) 180/55 (!) 207/50 (!) 159/116 (!) 173/49  Pulse: (!) 48  (!) 57   Resp:  (!) 23 13 19   Temp:   98.7 F (37.1 C)   TempSrc:   Oral   SpO2:   95%   Weight:        Intake/Output Summary (Last 24 hours) at 05/25/2017 1607 Last data filed at 05/25/2017 1555 Gross per 24 hour  Intake 240 ml  Output 250 ml  Net -10 ml   Filed Weights   05/21/17 0325 05/23/17 0530 05/25/17 0424  Weight: 59.4 kg (131 lb) 63.2 kg (139 lb 5.3 oz) 62.2 kg (137 lb 2 oz)    Examination:  General exam: Alert, sitting at bedside commode.  Respiratory system:  Normal respiratory effort, CTA Cardiovascular system: S 1, S 2 RRR, no edema Gastrointestinal system: BS present, soft, nt Central nervous system:  Alert, follows some command.  Extremities: no edema     Data Reviewed: I have personally reviewed following labs and imaging studies  CBC: Recent Labs  Lab 05/20/17 1220 05/21/17 0225 05/21/17 1922 05/23/17 0819 05/25/17 0600  WBC 7.2  --  14.2* 9.8 9.0  NEUTROABS 5.5  --   --   --  6.7  HGB 12.1  --  13.2 11.7* 11.3*  HCT 37.3 38.7 41.0 36.8 34.2*  MCV 87.4  --  87.8 88.7 86.8  PLT 230  --  213 220 236   Basic Metabolic Panel: Recent Labs  Lab 05/20/17 1220 05/21/17 0225 05/21/17 1922 05/22/17 1203 05/23/17 0819 05/25/17 0600  NA 142 143  --  139 141 142  K 3.6 3.4*  --  3.6 3.3* 2.8*  CL 108 109  --  108 111 111  CO2 28 28  --  22 24 25   GLUCOSE 96 113*  --  105* 111* 108*  BUN 22* 15  --  17 16 9   CREATININE 1.01* 0.88  0.92 1.00 0.91 0.80  CALCIUM 9.0 9.2  --  9.1 8.5* 8.7*  MG  --  2.0  --   --   --  1.7   GFR: CrCl cannot be calculated (Unknown ideal weight.). Liver Function Tests: Recent Labs  Lab 05/20/17 1220  AST 14*  ALT 14  ALKPHOS 76  BILITOT 0.5  PROT 6.4*  ALBUMIN 3.6   No results for input(s): LIPASE, AMYLASE in the last 168 hours. Recent Labs  Lab 05/21/17 0225  AMMONIA 13   Coagulation Profile: No results for input(s): INR, PROTIME in the last 168 hours. Cardiac Enzymes: Recent Labs  Lab 05/20/17 1220 05/21/17 0225 05/21/17 0700 05/21/17 1414  TROPONINI <0.03 <0.03 <0.03 0.03*   BNP (last 3 results) No results for input(s): PROBNP in the last 8760 hours. HbA1C: No  results for input(s): HGBA1C in the last 72 hours. CBG: Recent Labs  Lab 05/22/17 1559 05/24/17 0751 05/24/17 1127 05/24/17 1708 05/24/17 2013  GLUCAP 114* 115* 115* 154* 107*   Lipid Profile: No results for input(s): CHOL, HDL, LDLCALC, TRIG, CHOLHDL, LDLDIRECT in the last 72 hours. Thyroid Function Tests: No results for input(s): TSH, T4TOTAL, FREET4, T3FREE, THYROIDAB in the last 72 hours. Anemia Panel: No results for input(s): VITAMINB12, FOLATE, FERRITIN, TIBC, IRON, RETICCTPCT in the last 72 hours. Sepsis Labs: No results for input(s): PROCALCITON, LATICACIDVEN in the last 168 hours.  Recent Results (from the past 240 hour(s))  Urine culture     Status: Abnormal   Collection Time: 05/20/17  2:45 PM  Result Value Ref Range Status   Specimen Description URINE, CLEAN CATCH  Final   Special Requests NONE  Final   Culture MULTIPLE SPECIES PRESENT, SUGGEST RECOLLECTION (A)  Final   Report Status 05/21/2017 FINAL  Final  MRSA PCR Screening     Status: None   Collection Time: 05/20/17 11:50 PM  Result Value Ref Range Status   MRSA by PCR NEGATIVE NEGATIVE Final    Comment:        The GeneXpert MRSA Assay (FDA approved for NASAL specimens only), is one component of a comprehensive MRSA  colonization surveillance program. It is not intended to diagnose MRSA infection nor to guide or monitor treatment for MRSA infections.   Surgical PCR screen     Status: Abnormal   Collection Time: 05/21/17  2:24 PM  Result Value Ref Range Status   MRSA, PCR NEGATIVE NEGATIVE Final   Staphylococcus aureus POSITIVE (A) NEGATIVE Final    Comment: (NOTE) The Xpert SA Assay (FDA approved for NASAL specimens in patients 81 years of age and older), is one component of a comprehensive surveillance program. It is not intended to diagnose infection nor to guide or monitor treatment.          Radiology Studies: No results found.      Scheduled Meds: . aspirin  81 mg Oral Daily  . Chlorhexidine Gluconate Cloth  6 each Topical Daily  . enoxaparin (LOVENOX) injection  40 mg Subcutaneous Q24H  . hydrALAZINE  50 mg Oral Q8H  . lisinopril  20 mg Oral Once  . [START ON 05/26/2017] lisinopril  40 mg Oral Daily  . mupirocin ointment  1 application Nasal BID  . pravastatin  40 mg Oral q1800  . sodium chloride flush  3 mL Intravenous Q12H   Continuous Infusions: . sodium chloride 100 mL/hr at 05/25/17 0508     LOS: 5 days    Time spent: 35 minutes.    Author:  Lynden OxfordPranav Soyla Bainter, MD [facility] Pager: [phone] 05/25/2017 4:07 PM     If 7PM-7AM, please contact night-coverage [email or web address] Password [email or web address] 05/25/2017, 4:07 PM

## 2017-05-25 NOTE — Consult Note (Signed)
Physical Medicine and Rehabilitation Consult Reason for Consult: Decreased functional mobility Referring Physician: [facility]   HPI: Nicole [name] is a 83 y.o. right handed female with history of hypertension, polio. Per chart review patient lives downstairs apartment and son and daughter-in-law's home. Has a hired caregiver 3-4 hours during the week. Used a rolling walker for ambulation. Independent with bathing and dressing. Son and daughter reported patient with cognitive decline over the past 5-6 months. Presented 05/21/2017 with altered mental status no reports of recent trauma. In the ED patient bradycardic in the 40s systolic blood pressure 200s. EKG Mobitz type II block. Cranial CT scan negative. Troponin negative. MRI the brain completed showing acute lacunar infarct of the anterior left thalamus. Associated severe left PCA stenosis. Echocardiogram with ejection fraction of 65% grade 2 diastolic dysfunction. Cardiology service is consulted plan for pacemaker implantation after initially patient not able to tolerate 05/21/2017. Blood pressure monitored on multiple antihypertensive medications. Carotid Dopplers in no ICA stenosis. Subcutaneous Lovenox for DVT prophylaxis. Patient has a Recruitment consultant due to restlessness and limited awareness. Physical and occupational therapy evaluations completed with recommendations of physical medicine rehabilitation consult.   Review of Systems  Unable to perform ROS: Mental acuity   Past Medical History:  Diagnosis Date  . Hypertension   . Polio    Past Surgical History:  Procedure Laterality Date  . ABDOMINAL HYSTERECTOMY    . CHOLECYSTECTOMY    . PACEMAKER IMPLANT N/A 05/21/2017   Procedure: PACEMAKER IMPLANT;  Surgeon: Marinus Maw, MD;  Location: [facility] INVASIVE CV LAB;  Service: Cardiovascular;  Laterality: N/A;   Family History  Problem Relation Age of Onset  . Cancer Mother        stomach  . Cancer Sister        breast   Social  History:  reports that  has never smoked. she has never used smokeless tobacco. Her alcohol and drug histories are not on file. Allergies: No Known Allergies Medications Prior to Admission  Medication Sig Dispense Refill  . losartan (COZAAR) 100 MG tablet Take 100 mg by mouth daily.      Home: Home Living Family/patient expects to be discharged to:: Private residence Living Arrangements: Children Available Help at Discharge: Family, Available 24 hours/day Type of Home: House Home Access: Stairs to enter [employer] of Steps: 6 Entrance Stairs-Rails: Right, Left Home Layout: Two level, Able to live on main level with bedroom/bathroom Bathroom Shower/Tub: Other (comment)(walk in tub ) Bathroom Toilet: Handicapped height Home Equipment: Environmental consultant - 2 wheels, Cane - single point, Art gallery manager, Shower seat - built in Additional Comments: Pt has a downstairs apt in son's and daughter's in law home .  She has hired caregiver assist 3-4 hours during the week   Functional History: Prior Function Level of Independence: Independent with assistive device(s) Comments: Uses RW for gait.  Patient independent with bathing, dressing, meal prep.  Family provided transportation. Son and daughter in law report pt with recent cognitive decline of ~5-6 mos.  They recently realized pt was not paying her bills, nor was she taking her meds appropriately.   They also report she has become somewhat sedentary  Functional Status:  Mobility: Bed Mobility Overal bed mobility: Needs Assistance Bed Mobility: Supine to Sit, Sit to Supine Supine to sit: Min assist, HOB elevated Sit to supine: Min guard General bed mobility comments: Pt requires increased amount of time to move to EOB.  Mod verbal cues provided for problem solving.  She spontaneously returned to supine  Transfers Overall transfer level: Needs assistance Transfers: Sit to/from Stand Sit to Stand: Mod assist General transfer comment: Pt  moved sit to stand with mod A to steady.         ADL: ADL Overall ADL's : Needs assistance/impaired Eating/Feeding: Minimal assistance, Sitting Grooming: Brushing hair, Supervision/safety, Sitting Upper Body Bathing: Minimal assistance, Sitting Lower Body Bathing: Moderate assistance, Sit to/from stand Upper Body Dressing : Minimal assistance, Sitting Lower Body Dressing: Moderate assistance, Sit to/from stand Toilet Transfer: Moderate assistance, Stand-pivot, BSC Toileting- Clothing Manipulation and Hygiene: Moderate assistance, Sit to/from stand Functional mobility during ADLs: Moderate assistance General ADL Comments: Pt requires mod A for balance   Cognition: Cognition Overall Cognitive Status: Impaired/Different from baseline Orientation Level: Oriented to person, Disoriented to place, Disoriented to time, Disoriented to situation Cognition Arousal/Alertness: Awake/alert Behavior During Therapy: WFL for tasks assessed/performed Overall Cognitive Status: Impaired/Different from baseline Area of Impairment: Orientation, Attention, Memory, Following commands, Safety/judgement, Awareness, Problem solving Orientation Level: Disoriented to, Place, Time, Situation Current Attention Level: Sustained Memory: Decreased short-term memory Following Commands: Follows one step commands with increased time, Follows one step commands consistently Safety/Judgement: Decreased awareness of safety, Decreased awareness of deficits Problem Solving: Slow processing, Difficulty sequencing, Requires verbal cues, Requires tactile cues General Comments: Patient participating more easily this pm.    Blood pressure (!) 162/140, pulse (!) 50, temperature 98.6 F (37 C), temperature source Oral, resp. rate 18, weight 62.2 kg (137 lb 2 oz), SpO2 97 %. Physical Exam  Vitals reviewed. HENT:  Head: Normocephalic.  Eyes: EOM are normal.  Neck: Normal range of motion. Neck supple. No thyromegaly present.    Cardiovascular:  Cardiac rate controlled  Respiratory: Effort normal and breath sounds normal. No respiratory distress.  GI: Soft. Bowel sounds are normal. She exhibits no distension.  Neurological: She is alert.  Pleasantly confused. Exam was limited due to mental status. She was able to follow simple commands  Motor 4/5 in Bilateral delt, bi, tri, grip, HF, KE, ADF Sensation- reported as equal LT in BUE and BLE Word finding deficits, difficulty with naming, able to repeat  Results for orders placed or performed during the hospital encounter of 05/20/17 (from the past 24 hour(s))  Glucose, capillary     Status: Abnormal   Collection Time: 05/24/17  7:51 AM  Result Value Ref Range   Glucose-Capillary 115 (H) 65 - 99 mg/dL  Glucose, capillary     Status: Abnormal   Collection Time: 05/24/17 11:27 AM  Result Value Ref Range   Glucose-Capillary 115 (H) 65 - 99 mg/dL  Glucose, capillary     Status: Abnormal   Collection Time: 05/24/17  5:08 PM  Result Value Ref Range   Glucose-Capillary 154 (H) 65 - 99 mg/dL  Glucose, capillary     Status: Abnormal   Collection Time: 05/24/17  8:13 PM  Result Value Ref Range   Glucose-Capillary 107 (H) 65 - 99 mg/dL   No results found.  Assessment/Plan: Diagnosis: Left PCA infarct causing aphasia , gait and blance disorder impacting ADLs 1. Does the need for close, 24 hr/day medical supervision in concert with the patient's rehab needs make it unreasonable for this patient to be served in a less intensive setting? Yes 2. Co-Morbidities requiring supervision/potential complications: Mobitz 2 heart block with bradycardia,diastolic CHF, HTN 3. Due to bladder management, bowel management, safety, skin/wound care, disease management, medication administration, pain management and patient education, does the patient require 24 hr/day rehab nursing?  Yes 4. Does the patient require coordinated care of a physician, rehab nurse, PT (1-2 hrs/day, 5 days/week),  OT (1-2 hrs/day, 5 days/week) and SLP (.5-1 hrs/day, 5 days/week) to address physical and functional deficits in the context of the above medical diagnosis(es)? Yes Addressing deficits in the following areas: balance, endurance, locomotion, strength, transferring, bowel/bladder control, bathing, dressing, feeding, grooming, toileting, cognition, speech, language, swallowing and psychosocial support 5. Can the patient actively participate in an intensive therapy program of at least 3 hrs of therapy per day at least 5 days per week? Yes 6. The potential for patient to make measurable gains while on inpatient rehab is excellent 7. Anticipated functional outcomes upon discharge from inpatient rehab are supervision  with PT, supervision with OT, supervision with SLP. 8. Estimated rehab length of stay to reach the above functional goals is: 10-14d 9. Anticipated D/C setting: Home 10. Anticipated post D/C treatments: HH therapy 11. Overall Rehab/Functional Prognosis: excellent  RECOMMENDATIONS: This patient's condition is appropriate for continued rehabilitative care in the following setting: CIR Patient has agreed to participate in recommended program. Yes Note that insurance prior authorization may be required for reimbursement for recommended care.  Comment: will need telesitter   Charlton AmorDaniel J Angiulli, PA-C 05/25/2017

## 2017-05-25 NOTE — Care Management Important Message (Signed)
Important Message  Patient Details  Name: Nicole [name] MRN: [id number] Date of Birth: 01-09-1929   Medicare Important Message Given:  Yes    Kyla BalzarineShealy, Tacey Dimaggio Abena 05/25/2017, 9:57 AM

## 2017-05-25 NOTE — Progress Notes (Signed)
Patient was more alert earlier in the shift, responds to voice and pain at this time.  She has full movement of extremities but seems more sleepy than before, no sleep aid was provided to patient tonight.  Will continue to monitor patient.

## 2017-05-26 LAB — CBC
HCT: 34.2 % — ABNORMAL LOW (ref 36.0–46.0)
Hemoglobin: 10.8 g/dL — ABNORMAL LOW (ref 12.0–15.0)
MCH: 27.9 pg (ref 26.0–34.0)
MCHC: 31.6 g/dL (ref 30.0–36.0)
MCV: 88.4 fL (ref 78.0–100.0)
PLATELETS: 256 10*3/uL (ref 150–400)
RBC: 3.87 MIL/uL (ref 3.87–5.11)
RDW: 15.3 % (ref 11.5–15.5)
WBC: 7.8 10*3/uL (ref 4.0–10.5)

## 2017-05-26 LAB — BASIC METABOLIC PANEL
Anion gap: 6 (ref 5–15)
BUN: 8 mg/dL (ref 6–20)
CALCIUM: 8.8 mg/dL — AB (ref 8.9–10.3)
CO2: 23 mmol/L (ref 22–32)
CREATININE: 0.8 mg/dL (ref 0.44–1.00)
Chloride: 113 mmol/L — ABNORMAL HIGH (ref 101–111)
GFR calc Af Amer: >60 mL/min (ref >60)
GLUCOSE: 104 mg/dL — AB (ref 65–99)
Potassium: 3.6 mmol/L (ref 3.5–5.1)
SODIUM: 142 mmol/L (ref 135–145)

## 2017-05-26 LAB — MAGNESIUM: MAGNESIUM: 1.7 mg/dL (ref 1.7–2.4)

## 2017-05-26 MED ORDER — AMLODIPINE BESYLATE 2.5 MG PO TABS
2.5000 mg | ORAL_TABLET | Freq: Every day | ORAL | Status: DC
  Administered 2017-05-26 – 2017-05-27 (×2): 2.5 mg via ORAL
  Filled 2017-05-26 (×2): qty 1

## 2017-05-26 NOTE — Progress Notes (Signed)
PROGRESS NOTE    Nicole [name]  ZOX:[id number] DOB: 03-Jan-1929 DOA: 05/20/2017 PCP: System, Pcp Not In    Brief Narrative: Nicole [name] is a 83 y.o. female with medical history significant for hypertension, now presenting to the emergency department for evaluation of confusion.  History was obtained through family, discussion with ED personnel, and review of the EMR.  She has reportedly been having some difficulties with confusion for a year that has not previously been evaluated, but her son reports that this has worsened significantly in the past couple days.  Patient is not expressing any specific complaints.  There was no recent fall or trauma and she is not known to use illicit substances or alcohol.  She has not had a PCP and her medications ran out in March 2018.  [facility] ED Course: Upon arrival to the ED, patient is found to be afebrile, saturating well on room air, bradycardic in the 40s, and hypertensive with systolic pressures in the 200s.  EKG features a bradycardia with rate 42 and apparent Mobitz type II.  Noncontrast head CT is negative for acute intracranial abnormality.  Chemistry panel and CBC are unremarkable and troponin is undetectable.  Urine is not particularly suggestive of infection and sample was sent for culture.  Patient was treated with Norvasc and lisinopril in the emergency department and given prn doses of hydralazine.  Blood pressure improved, heart rate remained in the 40s, and there has not been any apparent respiratory distress.  Cardiology was consulted by the ED physician and recommended medical [facility] pac[facility].  [facility] be [facility] stepdown [facility] for [facility] evaluation and management of heart block, hypertensive urgency, and confusion.  Patient admitted with AMS, worsening confusion. Found to have hypertensive urgency, acute stroke and AV block. Neurology and EP has been helping   With patient management.    Assessment & Plan:   Principal Problem:   Heart block Active Problems:   Hypertensive urgency   Memory deficit   Acute encephalopathy   Mobitz type 2 second degree heart block   Cerebral thrombosis with cerebral infarction   Pacemaker  1-Bradycardia, Mobitz type 2 block.  TSH normal.  ECHO EF 60 % diastolic dysfunction grade 2.  Mg normal.  Cardiology consulted. Patient was not able to tolerates pacemake implantation 12-20. She will need general anesthesia.  Per Dr Ladona Ridgelaylor hold on pacemaker now, he would like to evaluate the patient as an outpatient, felt that the patient does not require acute pacemaker implant right now. Continues to have on and off heart block.  Asymptomatic though.  2-Acute thalamic stroke.  Acute encephalopathy, likely hypertensive MRI positive for thalamic stroke.  Neurology consulted. Started on aspirin and statins.  B 12 normal. LDL 142.  -Mental status improved this morning, alert , answering some questions, but still not at her baseline, son at bedside. -EEG shows metabolic encephalopathy without any active seizures or focus.  3- Hypertension with hypertensive urgency  Presented with significantly high blood pressure on admission. Found to have acute CVA therefore was required to have permissive hypertension. Now the patient is out of the window.  For permissive hypertension and therefore will start lowering the blood pressure. Started on IV as needed hydralazine, oral hydralazine 25 mg which has been increased to 50 mg 3 times daily. Patient was already on lisinopril 20 mg which I will increase to 40 mg daily. Added Norvasc, was on 10 mg with rapid  correction, will just start with 2.5 mg daily. Monitor.  4-Urine retention;  Resolved right now.  5.  Hypokalemia. Replacing oral, radial recheck one more time today.  6.  Insomnia. At Ambien.  DVT prophylaxis: Lovenox Code Status: full code.  Family Communication: No  family at at bedside. Disposition Plan: remain inpatient.  Inpatient rehab will not be able to take the patient until May 27, 2017 since they are closed on Christmas.   Consultants:   Cardiology  Neurology    Procedures:   ECHO   Antimicrobials: none   Subjective: No acute complaint, slept well last night.   Objective: Vitals:   05/26/17 1011 05/26/17 1055 05/26/17 1057 05/26/17 1338  BP: (!) 197/58 (!) 189/47 (!) 189/47 (!) 179/52  Pulse: (!) 49 (!) 51 (!) 48 (!) 52  Resp: 11 10 (!) 9 14  Temp:  98.4 F (36.9 C)  98.4 F (36.9 C)  TempSrc:  Oral  Oral  SpO2: 93% 94% 93% 95%  Weight:        Intake/Output Summary (Last 24 hours) at 05/26/2017 1349 Last data filed at 05/26/2017 1213 Gross per 24 hour  Intake 665 ml  Output 500 ml  Net 165 ml   Filed Weights   05/23/17 0530 05/25/17 0424 05/26/17 0438  Weight: 63.2 kg (139 lb 5.3 oz) 62.2 kg (137 lb 2 oz) 65.1 kg (143 lb 8.3 oz)    Examination:  General exam: Alert, sitting at bedside commode.  Respiratory system:  Normal respiratory effort, CTA Cardiovascular system: S 1, S 2 RRR, no edema Gastrointestinal system: BS present, soft, nt Central nervous system:  Alert, follows some command.  Extremities: no edema     Data Reviewed: I have personally reviewed following labs and imaging studies  CBC: Recent Labs  Lab 05/20/17 1220 05/21/17 0225 05/21/17 1922 05/23/17 0819 05/25/17 0600 05/26/17 0356  WBC 7.2  --  14.2* 9.8 9.0 7.8  NEUTROABS 5.5  --   --   --  6.7  --   HGB 12.1  --  13.2 11.7* 11.3* 10.8*  HCT 37.3 38.7 41.0 36.8 34.2* 34.2*  MCV 87.4  --  87.8 88.7 86.8 88.4  PLT 230  --  213 220 236 256   Basic Metabolic Panel: Recent Labs  Lab 05/21/17 0225  05/22/17 1203 05/23/17 0819 05/25/17 0600 05/25/17 1613 05/26/17 0356  NA 143  --  139 141 142 142 142  K 3.4*  --  3.6 3.3* 2.8* 3.8 3.6  CL 109  --  108 111 111 114* 113*  CO2 28  --  22 24 25 22 23   GLUCOSE 113*  --   105* 111* 108* 103* 104*  BUN 15  --  17 16 9 10 8   CREATININE 0.88   < > 1.00 0.91 0.80 0.80 0.80  CALCIUM 9.2  --  9.1 8.5* 8.7* 8.8* 8.8*  MG 2.0  --   --   --  1.7 1.8 1.7   < > = values in this interval not displayed.   GFR: CrCl cannot be calculated (Unknown ideal weight.). Liver Function Tests: Recent Labs  Lab 05/20/17 1220  AST 14*  ALT 14  ALKPHOS 76  BILITOT 0.5  PROT 6.4*  ALBUMIN 3.6   No results for input(s): LIPASE, AMYLASE in the last 168 hours. Recent Labs  Lab 05/21/17 0225  AMMONIA 13   Coagulation Profile: No results for input(s): INR, PROTIME in the last 168 hours. Cardiac Enzymes: Recent  Labs  Lab 05/20/17 1220 05/21/17 0225 05/21/17 0700 05/21/17 1414  TROPONINI <0.03 <0.03 <0.03 0.03*   BNP (last 3 results) No results for input(s): PROBNP in the last 8760 hours. HbA1C: No results for input(s): HGBA1C in the last 72 hours. CBG: Recent Labs  Lab 05/22/17 1559 05/24/17 0751 05/24/17 1127 05/24/17 1708 05/24/17 2013  GLUCAP 114* 115* 115* 154* 107*   Lipid Profile: No results for input(s): CHOL, HDL, LDLCALC, TRIG, CHOLHDL, LDLDIRECT in the last 72 hours. Thyroid Function Tests: No results for input(s): TSH, T4TOTAL, FREET4, T3FREE, THYROIDAB in the last 72 hours. Anemia Panel: No results for input(s): VITAMINB12, FOLATE, FERRITIN, TIBC, IRON, RETICCTPCT in the last 72 hours. Sepsis Labs: No results for input(s): PROCALCITON, LATICACIDVEN in the last 168 hours.  Recent Results (from the past 240 hour(s))  Urine culture     Status: Abnormal   Collection Time: 05/20/17  2:45 PM  Result Value Ref Range Status   Specimen Description URINE, CLEAN CATCH  Final   Special Requests NONE  Final   Culture MULTIPLE SPECIES PRESENT, SUGGEST RECOLLECTION (A)  Final   Report Status 05/21/2017 FINAL  Final  MRSA PCR Screening     Status: None   Collection Time: 05/20/17 11:50 PM  Result Value Ref Range Status   MRSA by PCR NEGATIVE NEGATIVE  Final    Comment:        The GeneXpert MRSA Assay (FDA approved for NASAL specimens only), is one component of a comprehensive MRSA colonization surveillance program. It is not intended to diagnose MRSA infection nor to guide or monitor treatment for MRSA infections.   Surgical PCR screen     Status: Abnormal   Collection Time: 05/21/17  2:24 PM  Result Value Ref Range Status   MRSA, PCR NEGATIVE NEGATIVE Final   Staphylococcus aureus POSITIVE (A) NEGATIVE Final    Comment: (NOTE) The Xpert SA Assay (FDA approved for NASAL specimens in patients 81 years of age and older), is one component of a comprehensive surveillance program. It is not intended to diagnose infection nor to guide or monitor treatment.          Radiology Studies: No results found.      Scheduled Meds: . amLODipine  2.5 mg Oral Daily  . aspirin  81 mg Oral Daily  . enoxaparin (LOVENOX) injection  40 mg Subcutaneous Q24H  . hydrALAZINE  50 mg Oral Q8H  . lisinopril  40 mg Oral Daily  . pravastatin  40 mg Oral q1800  . sodium chloride flush  3 mL Intravenous Q12H   Continuous Infusions:    LOS: 6 days    Time spent: 35 minutes.    Author:  Lynden OxfordPranav Terrie Haring, MD [facility] Pager: [phone] 05/26/2017 1:49 PM     If 7PM-7AM, please contact night-coverage [email or web address] Password [email or web address] 05/26/2017, 1:49 PM

## 2017-05-27 ENCOUNTER — Inpatient Hospital Stay (HOSPITAL_COMMUNITY)
Admission: RE | Admit: 2017-05-27 | Discharge: 2017-06-10 | DRG: 057 | Disposition: A | Discharge status: Home Health | Payer: Medicare Other | Source: Intra-hospital | Attending: Physical Medicine & Rehabilitation | Admitting: Physical Medicine & Rehabilitation

## 2017-05-27 ENCOUNTER — Inpatient Hospital Stay (HOSPITAL_COMMUNITY): Payer: Medicare Other | Admitting: Occupational Therapy

## 2017-05-27 ENCOUNTER — Encounter (HOSPITAL_COMMUNITY): Payer: Self-pay | Admitting: Emergency Medicine

## 2017-05-27 ENCOUNTER — Inpatient Hospital Stay (HOSPITAL_COMMUNITY): Payer: Medicare Other

## 2017-05-27 ENCOUNTER — Other Ambulatory Visit: Payer: Self-pay

## 2017-05-27 DIAGNOSIS — I160 Hypertensive urgency: Secondary | ICD-10-CM | POA: Diagnosis present

## 2017-05-27 DIAGNOSIS — E8809 Other disorders of plasma-protein metabolism, not elsewhere classified: Secondary | ICD-10-CM

## 2017-05-27 DIAGNOSIS — F05 Delirium due to known physiological condition: Secondary | ICD-10-CM | POA: Diagnosis not present

## 2017-05-27 DIAGNOSIS — R7303 Prediabetes: Secondary | ICD-10-CM | POA: Diagnosis present

## 2017-05-27 DIAGNOSIS — R001 Bradycardia, unspecified: Secondary | ICD-10-CM | POA: Diagnosis present

## 2017-05-27 DIAGNOSIS — E785 Hyperlipidemia, unspecified: Secondary | ICD-10-CM | POA: Diagnosis present

## 2017-05-27 DIAGNOSIS — R7989 Other specified abnormal findings of blood chemistry: Secondary | ICD-10-CM | POA: Diagnosis not present

## 2017-05-27 DIAGNOSIS — B952 Enterococcus as the cause of diseases classified elsewhere: Secondary | ICD-10-CM | POA: Diagnosis present

## 2017-05-27 DIAGNOSIS — R413 Other amnesia: Secondary | ICD-10-CM | POA: Diagnosis present

## 2017-05-27 DIAGNOSIS — R4701 Aphasia: Secondary | ICD-10-CM | POA: Diagnosis present

## 2017-05-27 DIAGNOSIS — I441 Atrioventricular block, second degree: Secondary | ICD-10-CM | POA: Diagnosis present

## 2017-05-27 DIAGNOSIS — E46 Unspecified protein-calorie malnutrition: Secondary | ICD-10-CM

## 2017-05-27 DIAGNOSIS — I69398 Other sequelae of cerebral infarction: Principal | ICD-10-CM

## 2017-05-27 DIAGNOSIS — Z90710 Acquired absence of both cervix and uterus: Secondary | ICD-10-CM | POA: Diagnosis not present

## 2017-05-27 DIAGNOSIS — K5901 Slow transit constipation: Secondary | ICD-10-CM

## 2017-05-27 DIAGNOSIS — Z79899 Other long term (current) drug therapy: Secondary | ICD-10-CM | POA: Diagnosis not present

## 2017-05-27 DIAGNOSIS — N390 Urinary tract infection, site not specified: Secondary | ICD-10-CM | POA: Diagnosis present

## 2017-05-27 DIAGNOSIS — D62 Acute posthemorrhagic anemia: Secondary | ICD-10-CM | POA: Diagnosis present

## 2017-05-27 DIAGNOSIS — N179 Acute kidney failure, unspecified: Secondary | ICD-10-CM | POA: Diagnosis present

## 2017-05-27 DIAGNOSIS — I69320 Aphasia following cerebral infarction: Secondary | ICD-10-CM | POA: Diagnosis not present

## 2017-05-27 DIAGNOSIS — R262 Difficulty in walking, not elsewhere classified: Secondary | ICD-10-CM | POA: Diagnosis present

## 2017-05-27 DIAGNOSIS — K5900 Constipation, unspecified: Secondary | ICD-10-CM | POA: Diagnosis present

## 2017-05-27 DIAGNOSIS — R0989 Other specified symptoms and signs involving the circulatory and respiratory systems: Secondary | ICD-10-CM

## 2017-05-27 DIAGNOSIS — I6381 Other cerebral infarction due to occlusion or stenosis of small artery: Secondary | ICD-10-CM | POA: Diagnosis not present

## 2017-05-27 DIAGNOSIS — Z950 Presence of cardiac pacemaker: Secondary | ICD-10-CM

## 2017-05-27 DIAGNOSIS — I10 Essential (primary) hypertension: Secondary | ICD-10-CM | POA: Diagnosis present

## 2017-05-27 DIAGNOSIS — R410 Disorientation, unspecified: Secondary | ICD-10-CM

## 2017-05-27 DIAGNOSIS — I169 Hypertensive crisis, unspecified: Secondary | ICD-10-CM | POA: Diagnosis present

## 2017-05-27 DIAGNOSIS — A499 Bacterial infection, unspecified: Secondary | ICD-10-CM | POA: Diagnosis not present

## 2017-05-27 DIAGNOSIS — E876 Hypokalemia: Secondary | ICD-10-CM | POA: Diagnosis present

## 2017-05-27 DIAGNOSIS — I63532 Cerebral infarction due to unspecified occlusion or stenosis of left posterior cerebral artery: Secondary | ICD-10-CM | POA: Diagnosis not present

## 2017-05-27 LAB — BASIC METABOLIC PANEL
Anion gap: 7 (ref 5–15)
BUN: 13 mg/dL (ref 6–20)
CALCIUM: 9 mg/dL (ref 8.9–10.3)
CHLORIDE: 109 mmol/L (ref 101–111)
CO2: 23 mmol/L (ref 22–32)
CREATININE: 0.98 mg/dL (ref 0.44–1.00)
GFR calc Af Amer: 59 mL/min — ABNORMAL LOW (ref >60)
GFR, EST NON AFRICAN AMERICAN: 51 mL/min — AB (ref >60)
Glucose, Bld: 120 mg/dL — ABNORMAL HIGH (ref 65–99)
Potassium: 3.8 mmol/L (ref 3.5–5.1)
SODIUM: 139 mmol/L (ref 135–145)

## 2017-05-27 LAB — CBC
HCT: 35.8 % — ABNORMAL LOW (ref 36.0–46.0)
Hemoglobin: 11.2 g/dL — ABNORMAL LOW (ref 12.0–15.0)
MCH: 27.9 pg (ref 26.0–34.0)
MCHC: 31.3 g/dL (ref 30.0–36.0)
MCV: 89.1 fL (ref 78.0–100.0)
PLATELETS: 261 10*3/uL (ref 150–400)
RBC: 4.02 MIL/uL (ref 3.87–5.11)
RDW: 15.3 % (ref 11.5–15.5)
WBC: 9 10*3/uL (ref 4.0–10.5)

## 2017-05-27 LAB — MAGNESIUM: Magnesium: 1.7 mg/dL (ref 1.7–2.4)

## 2017-05-27 MED ORDER — PRAVASTATIN SODIUM 40 MG PO TABS: 40.0000 mg | ORAL_TABLET | Freq: Every day | ORAL | 0 refills | Status: DC

## 2017-05-27 MED ORDER — LISINOPRIL 20 MG PO TABS
40.0000 mg | ORAL_TABLET | Freq: Every day | ORAL | Status: DC
  Administered 2017-05-28 – 2017-06-09 (×11): 40 mg via ORAL
  Filled 2017-05-27 (×15): qty 2

## 2017-05-27 MED ORDER — LISINOPRIL 40 MG PO TABS: 40.0000 mg | ORAL_TABLET | Freq: Every day | ORAL | 0 refills | Status: DC

## 2017-05-27 MED ORDER — MUPIROCIN 2 % EX OINT
1.0000 "application " | TOPICAL_OINTMENT | Freq: Two times a day (BID) | CUTANEOUS | Status: AC
  Administered 2017-05-27 – 2017-05-28 (×2): 1 via NASAL
  Filled 2017-05-27: qty 22

## 2017-05-27 MED ORDER — HYDRALAZINE HCL 50 MG PO TABS: 50.0000 mg | ORAL_TABLET | Freq: Three times a day (TID) | ORAL | 0 refills | Status: DC

## 2017-05-27 MED ORDER — ASPIRIN 81 MG PO CHEW: 81.0000 mg | CHEWABLE_TABLET | Freq: Every day | ORAL | 0 refills | Status: AC

## 2017-05-27 MED ORDER — HYDRALAZINE HCL 50 MG PO TABS
50.0000 mg | ORAL_TABLET | Freq: Three times a day (TID) | ORAL | Status: DC
  Administered 2017-05-27 – 2017-06-01 (×14): 50 mg via ORAL
  Filled 2017-05-27 (×14): qty 1

## 2017-05-27 MED ORDER — ONDANSETRON HCL 4 MG PO TABS
4.0000 mg | ORAL_TABLET | Freq: Four times a day (QID) | ORAL | Status: DC | PRN
  Administered 2017-05-28 – 2017-05-31 (×2): 4 mg via ORAL
  Filled 2017-05-27 (×2): qty 1

## 2017-05-27 MED ORDER — AMLODIPINE BESYLATE 2.5 MG PO TABS: 2.5000 mg | ORAL_TABLET | Freq: Every day | ORAL | 0 refills | Status: DC

## 2017-05-27 MED ORDER — ENOXAPARIN SODIUM 40 MG/0.4ML ~~LOC~~ SOLN: 40.0000 mg | SUBCUTANEOUS | Status: DC

## 2017-05-27 MED ORDER — AMLODIPINE BESYLATE 2.5 MG PO TABS
2.5000 mg | ORAL_TABLET | Freq: Every day | ORAL | Status: DC
  Administered 2017-05-28 – 2017-05-29 (×2): 2.5 mg via ORAL
  Filled 2017-05-27 (×2): qty 1

## 2017-05-27 MED ORDER — ONDANSETRON HCL 4 MG/2ML IJ SOLN: 4.0000 mg | Freq: Four times a day (QID) | INTRAMUSCULAR | Status: DC | PRN

## 2017-05-27 MED ORDER — ZOLPIDEM TARTRATE 5 MG PO TABS: 5.0000 mg | ORAL_TABLET | Freq: Every evening | ORAL | 0 refills | Status: DC | PRN

## 2017-05-27 MED ORDER — ASPIRIN 81 MG PO CHEW
81.0000 mg | CHEWABLE_TABLET | Freq: Every day | ORAL | Status: DC
  Administered 2017-05-28 – 2017-06-10 (×14): 81 mg via ORAL
  Filled 2017-05-27 (×15): qty 1

## 2017-05-27 MED ORDER — ACETAMINOPHEN 325 MG PO TABS
325.0000 mg | ORAL_TABLET | ORAL | Status: DC | PRN
  Administered 2017-05-27 – 2017-06-04 (×4): 650 mg via ORAL
  Filled 2017-05-27 (×4): qty 2

## 2017-05-27 MED ORDER — PRAVASTATIN SODIUM 20 MG PO TABS
40.0000 mg | ORAL_TABLET | Freq: Every day | ORAL | Status: DC
  Administered 2017-05-27 – 2017-06-09 (×13): 40 mg via ORAL
  Filled 2017-05-27 (×13): qty 2

## 2017-05-27 MED ORDER — SORBITOL 70 % SOLN
30.0000 mL | Freq: Every day | Status: DC | PRN
  Administered 2017-05-30 – 2017-06-05 (×2): 30 mL via ORAL
  Filled 2017-05-27 (×3): qty 30

## 2017-05-27 MED ORDER — ENOXAPARIN SODIUM 40 MG/0.4ML ~~LOC~~ SOLN
40.0000 mg | SUBCUTANEOUS | Status: DC
  Administered 2017-05-28 – 2017-06-09 (×12): 40 mg via SUBCUTANEOUS
  Filled 2017-05-27 (×12): qty 0.4

## 2017-05-27 NOTE — PMR Pre-admission (Signed)
PMR Admission Coordinator Pre-Admission Assessment  Patient: Nicole [name] is an 83 y.o., female MRN: [id number] DOB: [birth year] Height: (pt unaware of height) Weight: 64.6 kg (142 lb 6.7 oz)              Insurance Information HMO: No    PPO:       PCP:       IPA:       80/20:       OTHER:   PRIMARY:  Medicare A/B      Policy#: [id number]      Subscriber: patient CM Name:        Phone#:       Fax#:   Pre-Cert#:        Employer: Retired Benefits:  Phone #:       Name: Checked in Passport One source Eff. Date: 03/02/1996     Deduct:  $1340      Out of Pocket Max:  None      Life Max: N/A CIR: 100%      SNF: 100 days Outpatient: 80%     Co-Pay: 20% Home Health: 100%      Co-Pay: none DME: 80%     Co-Pay: 20% Providers: patient's choice  SECONDARY: BCBS      Policy#: [id number]      Subscriber:  patient CM Name:        Phone#:       Fax#:   Pre-Cert#:        Employer: Retired Benefits:  Phone #:  [phone]     Name:   Eff. Date:       Deduct:        Out of Pocket Max:        Life Max:   CIR:        SNF:   Outpatient:       Co-Pay:   Home Health:        Co-Pay:   DME:       Co-Pay:    Emergency Contact Information Contact Information    Name Relation Home Work Mobile   Cristela Feltwohlgemuth, tony Son [phone]       Current Medical History  Patient Admitting Diagnosis: Left PCA infarct causing aphasia , gait and blance disorder impacting ADLs  History of Present Illness: An 83 y.o.right handed femalewith history of hypertension, question history polio per documentation. Per chart review patient lives downstairs apartment and son and daughter-in-law's home. Has a hired caregiver 3 hours 5 days a week. Used a rolling walker for ambulation. Independent with bathing and dressing. Son and daughter reported patient with cognitive decline over the past 5-6 months. Presented 05/21/2017 with altered mental status no reports of recent trauma. In the ED patient bradycardic in the 40s systolic  blood pressure 200s. EKG Mobitz type II block. Cranial CT reviewed, unremarkable for acute process. Troponin negative. MRI the brain completed showing acute lacunar infarct of the anterior left thalamus. Associated severe left PCA stenosis. Echocardiogram with ejection fraction of 65% grade 2 diastolic dysfunction. Cardiology service is consulted plan for pacemaker implantation after initially patient not able to tolerate 05/21/2017. Blood pressure monitored on multiple antihypertensive medications. Carotid Dopplers in no ICA stenosis. Subcutaneous Lovenox for DVT prophylaxis.Patient has a Recruitment consultantsafety sitter due to restlessness and limited awareness.Physical and occupational therapy evaluations completed with recommendations of physical medicine rehabilitation consult. Patient to be admitted for a comprehensive inpatient rehabilitation program.   Total: 2=NIH  Past Medical History  Past Medical History:  Diagnosis Date  . Hypertension   . Polio     Family History  family history includes Cancer in her mother and sister.  Prior Rehab/Hospitalizations: No previous rehab  Has the patient had major surgery during 100 days prior to admission? No.  Son reports that patient had several back injections last year, but no surgery.  Current Medications   Current Facility-Administered Medications:  .  acetaminophen (TYLENOL) tablet 325-650 mg, 325-650 mg, Oral, Q4H PRN, Marinus Mawaylor, Gregg W, MD .  amLODipine (NORVASC) tablet 2.5 mg, 2.5 mg, Oral, Daily, Rolly SalterPatel, Pranav M, MD, 2.5 mg at 05/27/17 0837 .  aspirin chewable tablet 81 mg, 81 mg, Oral, Daily, Caryl PinaLindzen, Eric, MD, 81 mg at 05/27/17 0836 .  enoxaparin (LOVENOX) injection 40 mg, 40 mg, Subcutaneous, Q24H, Opyd, Timothy S, MD, 40 mg at 05/27/17 1214 .  hydrALAZINE (APRESOLINE) injection 5 mg, 5 mg, Intravenous, Q4H PRN, Regalado, Belkys A, MD, 5 mg at 05/26/17 0357 .  hydrALAZINE (APRESOLINE) tablet 50 mg, 50 mg, Oral, Q8H, Rolly SalterPatel, Pranav M, MD, 50 mg at  05/27/17 1303 .  lisinopril (PRINIVIL,ZESTRIL) tablet 40 mg, 40 mg, Oral, Daily, Rolly SalterPatel, Pranav M, MD, 40 mg at 05/27/17 0836 .  ondansetron (ZOFRAN) injection 4 mg, 4 mg, Intravenous, Q6H PRN, Marinus Mawaylor, Gregg W, MD .  ondansetron [facility](ZOFRAN) tablet 4 mg, 4 mg, Oral, Q6H PRN, 4 mg at 05/26/17 1139 **OR** [DISCONTINUED] ondansetron (ZOFRAN) injection 4 mg, 4 mg, Intravenous, Q6H PRN, Opyd, Lavone Neriimothy S, MD, 4 mg at 05/21/17 0853 .  pravastatin (PRAVACHOL) tablet 40 mg, 40 mg, Oral, q1800, Marvel PlanXu, Jindong, MD, 40 mg at 05/26/17 1731 .  senna-docusate (Senokot-S) tablet 1 tablet, 1 tablet, Oral, QHS PRN, Opyd, Timothy S, MD .  sodium chloride flush (NS) 0.9 % injection 3 mL, 3 mL, Intravenous, Q12H, Opyd, Timothy S, MD, 3 mL at 05/27/17 0933 .  zolpidem (AMBIEN) tablet 5 mg, 5 mg, Oral, QHS PRN, Rolly SalterPatel, Pranav M, MD  Patients Current Diet: Diet Heart Room service appropriate? Yes; Fluid consistency: Thin Diet - low sodium heart healthy  Precautions / Restrictions Precautions Precautions: Fall Precaution Comments: Confusion Restrictions Weight Bearing Restrictions: No   Has the patient had 2 or more falls or a fall with injury in the past year?Yes.  Son reports several falls each week with scrapes to legs.  Prior Activity Level Household[place]  Does not drive.  Goes out on the deck in sunshine if weather is nice.  Home Assistive Devices / Equipment Home Equipment: Environmental consultantWalker - 2 wheels, Gilmer MorCane - single point, Art gallery managerlectric scooter, Information systems managerhower seat - built in  Prior Device Use: Indicate devices/aids used by the patient prior to current illness, exacerbation or injury? 4 Wheeled IT trainerWalker and Cane  Prior Functional Level Prior Function Level of Independence: Independent with assistive device(s) Comments: Uses RW for gait.  Patient independent with bathing, dressing, meal prep.  Family provided transportation. Son and daughter in law report pt with recent cognitive decline of ~5-6 mos.  They recently  realized pt was not paying her bills, nor was she taking her meds appropriately.   They also report she has become somewhat sedentary   Self Care: Did the patient need help bathing, dressing, using the toilet or eating?  Independent  Indoor Mobility: Did the patient need assistance with walking from room to room (with or without device)? Independent  Stairs: Did the patient need assistance with internal or external stairs (with or without device)? Independent  Functional Cognition: Did  the patient need help planning regular tasks such as shopping or remembering to take medications? Independent  Current Functional Level Cognition  Arousal/Alertness: Awake/alert Overall Cognitive Status: Impaired/Different from baseline Current Attention Level: Selective Orientation Level: Oriented to person, Disoriented to place, Disoriented to time, Disoriented to situation Following Commands: Follows one step commands with increased time, Follows one step commands consistently Safety/Judgement: Decreased awareness of safety, Decreased awareness of deficits General Comments: improved attention and more talkative Attention: Focused, Sustained, Selective Focused Attention: Appears intact Sustained Attention: Appears intact Selective Attention: Impaired Selective Attention Impairment: Verbal basic, Functional basic Memory: Impaired Memory Impairment: Storage deficit, Decreased recall of new information, Decreased short term memory Decreased Short Term Memory: Verbal basic, Functional basic Awareness: Impaired Awareness Impairment: Emergent impairment Problem Solving: Impaired Problem Solving Impairment: Verbal basic, Functional basic Safety/Judgment: Impaired    Extremity Assessment (includes Sensation/Coordination)  Upper Extremity Assessment: Overall WFL for tasks assessed  Lower Extremity Assessment: Defer to PT evaluation    ADLs  Overall ADL's : Needs assistance/impaired Eating/Feeding:  Independent, Sitting Eating/Feeding Details (indicate cue type and reason): opens all containers independently Grooming: Wash/dry hands, Standing, Minimal assistance Grooming Details (indicate cue type and reason): pt attempting to use toothbrush for soap Upper Body Bathing: Minimal assistance, Sitting Lower Body Bathing: Moderate assistance, Sit to/from stand Upper Body Dressing : Minimal assistance, Sitting Upper Body Dressing Details (indicate cue type and reason): front opening gown Lower Body Dressing: Moderate assistance, Sit to/from stand Toilet Transfer: Minimal assistance, Stand-pivot, BSC, RW Toileting- Clothing Manipulation and Hygiene: Minimal assistance, Sit to/from stand Functional mobility during ADLs: Moderate assistance, +2 for safety/equipment, Rolling walker, Cueing for safety General ADL Comments: pt leaning to R upon initially standing    Mobility  Overal bed mobility: Needs Assistance Bed Mobility: Supine to Sit Supine to sit: Min assist, HOB elevated Sit to supine: Min guard General bed mobility comments: pulled up on therapist's hand    Transfers  Overall transfer level: Needs assistance Equipment used: Rolling walker (2 wheeled) Transfers: Sit to/from Stand Sit to Stand: Min assist General transfer comment: assist to rise and steady, cues for hand placement    Ambulation / Gait / Stairs / Wheelchair Mobility  Ambulation/Gait Ambulation/Gait assistance: Min assist, +2 safety/equipment Ambulation Distance (Feet): 80 Feet Assistive device: Rolling walker (2 wheeled) Gait Pattern/deviations: Step-through pattern, Trunk flexed(drift R) General Gait Details: began ambulation with R lean and running into objects on R side, needed frequent vc's to attend to obstacles on R. Also frequently let go of RW to grasp furniture within room. Son reports that she did this PTA and was the cause of some of her falls. Min A from behind for support as well as min A from front to  guide RW at times. Pt maintained trunk flexion throughout with some improvement when cued to step into RW. Son reports she ambulated with flexed trunk PTA as well. R lean improved with distance. Pt began to fatigue after ~60'.  Gait velocity: decreased Gait velocity interpretation: <1.8 ft/sec, indicative of risk for recurrent falls    Posture / Balance Dynamic Sitting Balance Sitting balance - Comments: could bring self to edge of chair without LOB Balance Overall balance assessment: Needs assistance Sitting-balance support: No upper extremity supported, Feet supported Sitting balance-Leahy Scale: Good Sitting balance - Comments: could bring self to edge of chair without LOB Standing balance support: Single extremity supported Standing balance-Leahy Scale: Poor Standing balance comment: pt able to maintain static balance at sink to wash hands  Special needs/care consideration BiPAP/CPAP No CPM No Continuous Drip IV No  Dialysis No       Life Vest No Oxygen No Special Bed No Trach Size No Wound Vac (area) No     Skin Bruises easily                           Bowel mgmt: Last BM 05/26/17 Bladder mgmt: Intermittent catheterizations Diabetic mgmt     Previous Home Environment Living Arrangements: Children Available Help at Discharge: Family, Available 24 hours/day Type of Home: House Home Layout: Two level, Able to live on main level with bedroom/bathroom Home Access: Stairs to enter Entrance Stairs-Rails: Right, Left Entrance Stairs-Number of Steps: 6 Bathroom Shower/Tub: Other (comment)(walk in tub ) Bathroom Toilet: Handicapped height Additional Comments: Pt has a downstairs apt in son's and daughter's in law home .  She has hired caregiver assist 3-4 hours during the week   Discharge Living Setting Plans for Discharge Living Setting: House, Lives with (comment)(Lives with son and dtr-in-law.) Type of Home at Discharge: House Discharge Home Layout: Two level, Able to  live on main level with bedroom/bathroom Alternate Level Stairs-Number of Steps: Flight, but does not go upstairs Discharge Home Access: Stairs to enter [employer] of Steps: 6-7 step entry Does the patient have any problems obtaining your medications?: No  Social/Family/Support Systems Patient Roles: Parent(Has 5 sons, daughter-in-law.  Recently lost boyfriend.) Contact Information: Cristela Felt - son Anticipated Caregiver: Son and dtr-in-law Anticipated Caregiver's Contact Information: [name] - son - [phone] Ability/Limitations of Caregiver: Family stay on the farm all day.  Has a caregiver 10 am to 1 pm 5 days a week. Caregiver Availability: Intermittent Discharge Plan Discussed with Primary Caregiver: Yes Is Caregiver In Agreement with Plan?: Yes Does Caregiver/Family have Issues with Lodging/Transportation while Pt is in Rehab?: No  Goals/Additional Needs Patient/Family Goal for Rehab: PT/OT/SLP supervision goals Expected length of stay: 10-14 days Cultural Considerations: From [place], [place].  Speaks English, accent is mild. Dietary Needs: Heart diet, thin liquids Equipment Needs: TBD Pt/Family Agrees to Admission and willing to participate: Yes Program Orientation Provided & Reviewed with Pt/Caregiver Including Roles  & Responsibilities: Yes  Decrease burden of Care through IP rehab admission: N/A  Possible need for SNF placement upon discharge: Not anticipated  Patient Condition: This patient's medical and functional status has changed since the consult dated: 05/25/17 in which the Rehabilitation Physician determined and documented that the patient's condition is appropriate for intensive rehabilitative care in an inpatient rehabilitation facility. See "History of Present Illness" (above) for medical update. Functional changes are:  Currently requiring min assist to ambulate 80 feet RW. Patient's medical and functional status update has been discussed with  the Rehabilitation physician and patient remains appropriate for inpatient rehabilitation. Will admit to inpatient rehab today.  Preadmission Screen Completed By:  Trish Mage, 05/27/2017 2:06 PM ______________________________________________________________________   Discussed status with Dr. Allena Katz on 05/27/17 at 1407 and received telephone approval for admission today.  Admission Coordinator:  Trish Mage, time 1407/Date 05/27/17

## 2017-05-27 NOTE — H&P (Signed)
Physical Medicine and Rehabilitation Admission H&P    Chief Complaint  Patient presents with  . Memory Loss  : HPI: Nicole [name] is a 83 y.o. right handed female with history of hypertension, question history polio per documentation. Per chart review patient lives downstairs apartment and son and daughter-in-law's home. Has a hired caregiver 3-4 hours during the week. Used a rolling walker for ambulation. Independent with bathing and dressing. Son and daughter reported patient with cognitive decline over the past 5-6 months. Presented 05/21/2017 with altered mental status no reports of recent trauma. In the ED patient bradycardic in the 22G systolic blood pressure 254Y. EKG Mobitz type II block. Cranial CT reviewed, unremarkable for acute process. Troponin negative. MRI the brain completed showing acute lacunar infarct of the anterior left thalamus. Associated severe left PCA stenosis. Echocardiogram with ejection fraction of 70% grade 2 diastolic dysfunction. Cardiology service is consulted plan for pacemaker implantation after initially patient not able to tolerate 05/21/2017. Blood pressure monitored on multiple antihypertensive medications. Carotid Dopplers in no ICA stenosis. Subcutaneous Lovenox for DVT prophylaxis. Patient has a Air cabin crew due to restlessness and limited awareness. Physical and occupational therapy evaluations completed with recommendations of physical medicine rehabilitation consult. Patient was admitted for a comprehensive rehabilitation program  Review of Systems  Unable to perform ROS: Mental acuity   Past Medical History:  Diagnosis Date  . Hypertension   . Polio    Past Surgical History:  Procedure Laterality Date  . ABDOMINAL HYSTERECTOMY    . CHOLECYSTECTOMY    . PACEMAKER IMPLANT N/A 05/21/2017   Procedure: PACEMAKER IMPLANT;  Surgeon: Evans Lance, MD;  Location: [facility];  Service: Cardiovascular;  Laterality: N/A;   Family History    Problem Relation Age of Onset  . Cancer Mother        stomach  . Cancer Sister        breast   Social History:  reports that  has never smoked. she has never used smokeless tobacco. Her alcohol and drug histories are not on file. Allergies: No Known Allergies Medications Prior to Admission  Medication Sig Dispense Refill  . losartan (COZAAR) 100 MG tablet Take 100 mg by mouth daily.      Drug Regimen Review Drug regimen was reviewed and remains appropriate with no significant issues identified  Home: Home Living Family/patient expects to be discharged to:: Private residence Living Arrangements: Children Available Help at Discharge: Family, Available 24 hours/day Type of Home: House Home Access: Stairs to enter [employer] of Steps: 6 Entrance Stairs-Rails: Right, Left Home Layout: Two level, Able to live on main level with bedroom/bathroom Bathroom Shower/Tub: Other (comment)(walk in tub ) Bathroom Toilet: Handicapped height Home Equipment: Environmental consultant - 2 wheels, [place] - single point, Transport planner, Shower seat - built in Additional Comments: Pt has a downstairs apt in son's and daughter's in law home .  She has hired caregiver assist 3-4 hours during the week    Functional History: Prior Function Level of Independence: Independent with assistive device(s) Comments: Uses RW for gait.  Patient independent with bathing, dressing, meal prep.  Family provided transportation. Son and daughter in law report pt with recent cognitive decline of ~5-6 mos.  They recently realized pt was not paying her bills, nor was she taking her meds appropriately.   They also report she has become somewhat sedentary   Functional Status:  Mobility: Bed Mobility Overal bed mobility: Needs Assistance Bed Mobility: Supine to Sit, Sit  to Supine Supine to sit: Min assist, HOB elevated Sit to supine: Min guard General bed mobility comments: pt received in chair Transfers Overall transfer  level: Needs assistance Equipment used: Rolling walker (2 wheeled) Transfers: Sit to/from Stand Sit to Stand: Mod assist, +2 safety/equipment General transfer comment: pt attempted sit to stand a couple of times without assistance and tried pushing from chair as well as with hands on RW. Could not get wt shifted fwd and noted confusion with the task. Min A +2 for fwd wt shift and steadying as well as vc's for hand placement with RW Ambulation/Gait Ambulation/Gait assistance: Min assist, +2 safety/equipment Ambulation Distance (Feet): 80 Feet Assistive device: Rolling walker (2 wheeled) Gait Pattern/deviations: Step-through pattern, Trunk flexed(drift R) General Gait Details: began ambulation with R lean and running into objects on R side, needed frequent vc's to attend to obstacles on R. Also frequently let go of RW to grasp furniture within room. Son reports that she did this PTA and was the cause of some of her falls. Min A from behind for support as well as min A from front to guide RW at times. Pt maintained trunk flexion throughout with some improvement when cued to step into RW. Son reports she ambulated with flexed trunk PTA as well. R lean improved with distance. Pt began to fatigue after ~60'.  Gait velocity: decreased Gait velocity interpretation: <1.8 ft/sec, indicative of risk for recurrent falls    ADL: ADL Overall ADL's : Needs assistance/impaired Eating/Feeding: Independent, Sitting Eating/Feeding Details (indicate cue type and reason): opens all containers independently Grooming: Wash/dry hands, Standing, Minimal assistance Grooming Details (indicate cue type and reason): pt attempting to use toothbrush for soap Upper Body Bathing: Minimal assistance, Sitting Lower Body Bathing: Moderate assistance, Sit to/from stand Upper Body Dressing : Minimal assistance, Sitting Upper Body Dressing Details (indicate cue type and reason): front opening gown Lower Body Dressing: Moderate  assistance, Sit to/from stand Toilet Transfer: Moderate assistance, Stand-pivot, BSC Toileting- Clothing Manipulation and Hygiene: Moderate assistance, Sit to/from stand Functional mobility during ADLs: Moderate assistance, +2 for safety/equipment, Rolling walker, Cueing for safety General ADL Comments: Pt requires mod A for balance   Cognition: Cognition Overall Cognitive Status: Impaired/Different from baseline Arousal/Alertness: Awake/alert Orientation Level: Oriented to person, Disoriented to place, Disoriented to time, Disoriented to situation Attention: Focused, Sustained, Selective Focused Attention: Appears intact Sustained Attention: Appears intact Selective Attention: Impaired Selective Attention Impairment: Verbal basic, Functional basic Memory: Impaired Memory Impairment: Storage deficit, Decreased recall of new information, Decreased short term memory Decreased Short Term Memory: Verbal basic, Functional basic Awareness: Impaired Awareness Impairment: Emergent impairment Problem Solving: Impaired Problem Solving Impairment: Verbal basic, Functional basic Safety/Judgment: Impaired Cognition Arousal/Alertness: Awake/alert Behavior During Therapy: Flat affect Overall Cognitive Status: Impaired/Different from baseline Area of Impairment: Orientation, Attention, Memory, Following commands, Safety/judgement, Awareness, Problem solving Orientation Level: Disoriented to, Place, Time, Situation Current Attention Level: Sustained Memory: Decreased short-term memory Following Commands: Follows one step commands with increased time, Follows one step commands consistently Safety/Judgement: Decreased awareness of safety, Decreased awareness of deficits Problem Solving: Slow processing, Difficulty sequencing, Requires verbal cues, Requires tactile cues General Comments: Pt easily distracted by objects in environment as well as conversation, difficulty remaining focused on task.  Decreased attention to R side. Apraxia noted with transitions in mobility  Physical Exam: Blood pressure (!) 180/55, pulse (!) 48, temperature 98.7 F (37.1 C), temperature source Oral, resp. rate 16, weight 62.2 kg (137 lb 2 oz), SpO2 97 %. Physical Exam  Vitals reviewed. Constitutional: She appears well-developed.  Frail  HENT:  Head: Normocephalic and atraumatic.  Eyes: EOM are normal. Right eye exhibits no discharge. Left eye exhibits no discharge.  Neck: Normal range of motion. Neck supple.  Cardiovascular:  Cardiac rate controlled  Respiratory: Effort normal and breath sounds normal.  GI: Soft. Bowel sounds are normal.  Musculoskeletal: She exhibits no edema or tenderness.  Neurological: She is alert.  A&Ox1 Exam was limited due to mental status.  She was able to follow simple commands  Motor: 4+/5 in Bilateral delt, bi, tri, grip LLE: 4-/5 HF, KE, difficulty with follow commands for ankle RLE: 4/5 HF, KE, difficulty with follow commands for ankle  Skin: Skin is warm and dry.  Psychiatric: Her affect is blunt. Her speech is delayed and slurred. She is slowed. Cognition and memory are impaired.    Results for orders placed or performed during the hospital encounter of 05/20/17 (from the past 48 hour(s))  Glucose, capillary     Status: Abnormal   Collection Time: 05/24/17  7:51 AM  Result Value Ref Range   Glucose-Capillary 115 (H) 65 - 99 mg/dL  Glucose, capillary     Status: Abnormal   Collection Time: 05/24/17 11:27 AM  Result Value Ref Range   Glucose-Capillary 115 (H) 65 - 99 mg/dL  Glucose, capillary     Status: Abnormal   Collection Time: 05/24/17  5:08 PM  Result Value Ref Range   Glucose-Capillary 154 (H) 65 - 99 mg/dL  Glucose, capillary     Status: Abnormal   Collection Time: 05/24/17  8:13 PM  Result Value Ref Range   Glucose-Capillary 107 (H) 65 - 99 mg/dL  CBC with Differential/Platelet     Status: Abnormal   Collection Time: 05/25/17  6:00 AM  Result  Value Ref Range   WBC 9.0 4.0 - 10.5 K/uL   RBC 3.94 3.87 - 5.11 MIL/uL   Hemoglobin 11.3 (L) 12.0 - 15.0 g/dL   HCT 34.2 (L) 36.0 - 46.0 %   MCV 86.8 78.0 - 100.0 fL   MCH 28.7 26.0 - 34.0 pg   MCHC 33.0 30.0 - 36.0 g/dL   RDW 14.8 11.5 - 15.5 %   Platelets 236 150 - 400 K/uL   Neutrophils Relative % 74 %   Neutro Abs 6.7 1.7 - 7.7 K/uL   Lymphocytes Relative 17 %   Lymphs Abs 1.5 0.7 - 4.0 K/uL   Monocytes Relative 6 %   Monocytes Absolute 0.5 0.1 - 1.0 K/uL   Eosinophils Relative 3 %   Eosinophils Absolute 0.3 0.0 - 0.7 K/uL   Basophils Relative 0 %   Basophils Absolute 0.0 0.0 - 0.1 K/uL  Basic metabolic panel     Status: Abnormal   Collection Time: 05/25/17  6:00 AM  Result Value Ref Range   Sodium 142 135 - 145 mmol/L   Potassium 2.8 (L) 3.5 - 5.1 mmol/L   Chloride 111 101 - 111 mmol/L   CO2 25 22 - 32 mmol/L   Glucose, Bld 108 (H) 65 - 99 mg/dL   BUN 9 6 - 20 mg/dL   Creatinine, Ser 0.80 0.44 - 1.00 mg/dL   Calcium 8.7 (L) 8.9 - 10.3 mg/dL   GFR calc non Af Amer >60 >60 mL/min   GFR calc Af Amer >60 >60 mL/min    Comment: (NOTE) The eGFR has been calculated using the CKD EPI equation. This calculation has not been validated in all clinical situations. eGFR's persistently <  60 mL/min signify possible Chronic Kidney Disease.    Anion gap 6 5 - 15  Magnesium     Status: None   Collection Time: 05/25/17  6:00 AM  Result Value Ref Range   Magnesium 1.7 1.7 - 2.4 mg/dL   No results found.  Medical Problem List and Plan: 1.  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  DVT Prophylaxis/Anticoagulation: Subcutaneous Lovenox 3. Pain Management: Hydrocodone as needed 4. Mood/memory loss: Provide emotional support. Provide tele sitter for safety 5. Neuropsych: This patient is capable of making decisions on her own behalf. 6. Skin/Wound Care: Routine skin checks 7. Fluids/Electrolytes/Nutrition: Routine I&O's with follow-up chemistries 8.  Hypertension, currently uncontrolled. Norvasc 2.5 mg daily, Lisinopril 40 mg daily, hydralazine 50 mg every 8 hours 9. Bradycardia. Attempts at placement of pacemaker 05/21/2017 unsuccessful. Plan to continue to monitor. 10. Constipation. Laxative assistance 11. Hyperlipidemia. Pravachol   Post Admission Physician Evaluation: 1. Preadmission assessment reviewed and changes made below. 2. Functional deficits secondary  to left PCA infarction. 3. Patient is admitted to receive collaborative, interdisciplinary care between the physiatrist, rehab nursing staff, and therapy team. 4. Patient's level of medical complexity and substantial therapy needs in context of that medical necessity cannot be provided at a lesser intensity of care such as a SNF. 5. Patient has experienced substantial functional loss from his/her baseline which was documented above under the "Functional History" and "Functional Status" headings.  Judging by the patient's diagnosis, physical exam, and functional history, the patient has potential for functional progress which will result in measurable gains while on inpatient rehab.  These gains will be of substantial and practical use upon discharge  in facilitating mobility and self-care at the household level. 93. Physiatrist will provide 24 hour management of medical needs as well as oversight of the therapy plan/treatment and provide guidance as appropriate regarding the interaction of the two. 7. 24 hour rehab nursing will assist with bladder management, safety, disease management, medication administration and patient education  and help integrate therapy concepts, techniques,education, etc. 8. PT will assess and treat for/with: Lower extremity strength, range of motion, stamina, balance, functional mobility, safety, adaptive techniques and equipment, coping skills, pain control, education.   Goals are: Supervision. 9. OT will assess and treat for/with: ADL's, functional mobility,  safety, upper extremity strength, adaptive techniques and equipment, ego support, and community reintegration.   Goals are: Supervision. Therapy may proceed with showering this patient. 10. SLP will assess and treat for/with: speech, language, cognition.  Goals are: Supervision. 11. Case Management and Social Worker will assess and treat for psychological issues and discharge planning. 12. Team conference will be held weekly to assess progress toward goals and to determine barriers to discharge. 13. Patient will receive at least 3 hours of therapy per day at least 5 days per week. 14. ELOS: 5-9 days.        15. Prognosis:  good  Delice Lesch, MD, ABPMR Lavon Paganini Angiulli, PA-C 05/25/2017

## 2017-05-27 NOTE — Progress Notes (Signed)
Pt discharged to inpatient rehab. PIV removed. Report given to receiving nurse. Pt transported to CIR via wheelchair at approximately 1630, pt with family at time of transfer.

## 2017-05-27 NOTE — Progress Notes (Signed)
PT Cancellation Note  Patient Details Name: Nicole [name] MRN: [id number] DOB: 28-Apr-1929   Cancelled Treatment:    Reason Eval/Treat Not Completed: Patient declined, no reason specified  "just up, I don't want to now."  I will try back later as able. 05/27/2017  [place] BingKen Bennet Kujawa, PT [phone]  (pager)   Eliseo GumKenneth V Loriene Taunton 05/27/2017, 12:40 PM

## 2017-05-27 NOTE — Care Management Note (Signed)
Case Management Note  Patient Details  Name: Nicole [name] MRN: [id number] Date of Birth: [birth year]  Subjective/Objective:             DC to CIR       Action/Plan:   Expected Discharge Date:  05/27/17               Expected Discharge Plan:  IP Rehab Facility  In-House Referral:     Discharge planning Services  CM Consult  Post Acute Care Choice:    Choice offered to:     DME Arranged:    DME Agency:     HH Arranged:    HH Agency:     Status of Service:  Completed, signed off  If discussed at [employer] Length of Stay Meetings, dates discussed:    Additional Comments:  Lawerance SabalDebbie Mikah Poss, RN 05/27/2017, 2:06 PM

## 2017-05-27 NOTE — Progress Notes (Signed)
Occupational Therapy Treatment Patient Details Name: Nicole [name] MRN: [id number] DOB: 1928/09/04 Today's Date: 05/27/2017    History of present illness Patient is an 83 yo admitted 05/20/17 with AMS, worsening confusion. In ED, patient found to have bradycardia, heart block, hypertensive urgency, and acute stroke - MRI of brain showed acute lacunar infarct of the anterior Lt thalamus; associated Severe Lt PCA stenosis.   PMH:  HTN, back/leg pain, Polio.   OT comments  Pt more talkative today, agreeable to continued rehab stating, "my family just doesn't have time." Pt performed toileting, one grooming task at sink and remained up in chair for lunch. Requiring min assist and verbal cues for walker use within room. Continues to have difficulty navigating around objects.  Follow Up Recommendations  CIR;Supervision/Assistance - 24 hour    Equipment Recommendations       Recommendations for Other Services      Precautions / Restrictions Precautions Precautions: Fall       Mobility Bed Mobility Overal bed mobility: Needs Assistance Bed Mobility: Supine to Sit     Supine to sit: Min assist;HOB elevated     General bed mobility comments: pulled up on therapist's hand  Transfers   Equipment used: Rolling walker (2 wheeled) Transfers: Sit to/from Stand Sit to Stand: Min assist         General transfer comment: assist to rise and steady, cues for hand placement    Balance Overall balance assessment: Needs assistance   Sitting balance-Leahy Scale: Good     Standing balance support: Single extremity supported Standing balance-Leahy Scale: Poor                             ADL either performed or assessed with clinical judgement   ADL Overall ADL's : Needs assistance/impaired     Grooming: Wash/dry hands;Standing;Minimal assistance           Upper Body Dressing : Minimal assistance;Sitting       Toilet Transfer: Minimal  assistance;Stand-pivot;BSC;RW   Toileting- Clothing Manipulation and Hygiene: Minimal assistance;Sit to/from stand         General ADL Comments: pt leaning to R upon initially standing     Vision       Perception     Praxis      Cognition Arousal/Alertness: Awake/alert Behavior During Therapy: Flat affect Overall Cognitive Status: Impaired/Different from baseline Area of Impairment: Orientation;Memory;Problem solving                 Orientation Level: Disoriented to;Situation;Time Current Attention Level: Selective Memory: Decreased short-term memory       Problem Solving: Slow processing;Difficulty sequencing;Requires verbal cues;Requires tactile cues General Comments: improved attention and more talkative        Exercises     Shoulder Instructions       General Comments      Pertinent Vitals/ Pain       Pain Assessment: No/denies pain  Home Living                                          Prior Functioning/Environment              Frequency  Min 2X/week        Progress Toward Goals  OT Goals(current goals can now be found in the care plan section)  Progress towards OT  goals: Progressing toward goals  Acute Rehab OT Goals Patient Stated Goal: To maximize independence  OT Goal Formulation: With patient/family Time For Goal Achievement: 06/06/17 Potential to Achieve Goals: Good  Plan Discharge plan remains appropriate    Co-evaluation                 AM-PAC PT "6 Clicks" Daily Activity     Outcome Measure   Help from another person eating meals?: A Little Help from another person taking care of personal grooming?: A Little Help from another person toileting, which includes using toliet, bedpan, or urinal?: A Little Help from another person bathing (including washing, rinsing, drying)?: A Lot Help from another person to put on and taking off regular upper body clothing?: A Little Help from another person  to put on and taking off regular lower body clothing?: A Lot 6 Click Score: 16    End of Session Equipment Utilized During Treatment: Gait belt;Rolling walker  OT Visit Diagnosis: Unsteadiness on feet (R26.81);Cognitive communication deficit (R41.841) Symptoms and signs involving cognitive functions: Cerebral infarction   Activity Tolerance Patient tolerated treatment well   Patient Left in chair;with call bell/phone within reach;with chair alarm set   Nurse Communication          Time: [phone] OT Time Calculation (min): 22 min  Charges: OT General Charges $OT Visit: 1 Visit OT Treatments $Self Care/Home Management : 8-22 mins  05/27/2017 Nicole [name], OTR/L Pager: [phone]  Nicole [name], Nicole [name] 05/27/2017, 12:53 PM

## 2017-05-27 NOTE — Progress Notes (Signed)
Physical Therapy Treatment Patient Details Name: Nicole [name] MRN: [id number] DOB: July 23, 1928 Today's Date: 05/27/2017    History of Present Illness Patient is an 83 yo admitted 05/20/17 with AMS, worsening confusion. In ED, patient found to have bradycardia, heart block, hypertensive urgency, and acute stroke - MRI of brain showed acute lacunar infarct of the anterior Lt thalamus; associated Severe Lt PCA stenosis.   PMH:  HTN, back/leg pain, Polio.    PT Comments    Improving slowly.  Emphasis today on transitions to EOB/standing with stress on safety and stance/balance in the RW and progressive gait training.   Follow Up Recommendations  CIR;Supervision/Assistance - 24 hour     Equipment Recommendations  None recommended by PT    Recommendations for Other Services       Precautions / Restrictions Precautions Precautions: Fall Precaution Comments: Confusion    Mobility  Bed Mobility Overal bed mobility: Needs Assistance Bed Mobility: Supine to Sit     Supine to sit: Min assist;HOB elevated Sit to supine: Min guard   General bed mobility comments: needed minor truncal support.  Transfers Overall transfer level: Needs assistance Equipment used: Rolling walker (2 wheeled) Transfers: Sit to/from Stand Sit to Stand: Min assist         General transfer comment: assist to come forward.  consistent cuing for hand placement and safety  Ambulation/Gait Ambulation/Gait assistance: Min assist Ambulation Distance (Feet): 90 Feet Assistive device: Rolling walker (2 wheeled) Gait Pattern/deviations: Step-through pattern Gait velocity: decreased Gait velocity interpretation: Below normal speed for age/gender General Gait Details: gait pattern characterized as mild foot slap with mild righ circumduction as pt's gait fatigued.  pt needed consistent w/shift assist and support for R list..  cues for postural checks.   Stairs            Wheelchair Mobility     Modified Rankin (Stroke Patients Only) Modified Rankin (Stroke Patients Only) Modified Rankin: Moderately severe disability     Balance Overall balance assessment: Needs assistance Sitting-balance support: No upper extremity supported;Feet supported Sitting balance-Leahy Scale: Good       Standing balance-Leahy Scale: Poor Standing balance comment: needed minimal external support for safety                            Cognition Arousal/Alertness: Awake/alert Behavior During Therapy: Flat affect Overall Cognitive Status: Impaired/Different from baseline Area of Impairment: Orientation;Memory;Problem solving                 Orientation Level: Place;Time;Situation Current Attention Level: Selective Memory: Decreased short-term memory Following Commands: Follows one step commands with increased time;Follows one step commands consistently Safety/Judgement: Decreased awareness of safety;Decreased awareness of deficits   Problem Solving: Slow processing;Difficulty sequencing;Requires verbal cues;Requires tactile cues General Comments: easily redirected to stay mostly on task.      Exercises      General Comments        Pertinent Vitals/Pain Pain Assessment: Faces Faces Pain Scale: No hurt    Home Living                      Prior Function            PT Goals (current goals can now be found in the care plan section) Acute Rehab PT Goals Patient Stated Goal: To maximize independence  PT Goal Formulation: With patient/family Time For Goal Achievement: 05/30/17 Potential to Achieve Goals: Fair Progress towards PT goals:  Progressing toward goals    Frequency    Min 4X/week      PT Plan Current plan remains appropriate    Co-evaluation              AM-PAC PT "6 Clicks" Daily Activity  Outcome Measure  Difficulty turning over in bed (including adjusting bedclothes, sheets and blankets)?: None Difficulty moving from lying  on back to sitting on the side of the bed? : Unable Difficulty sitting down on and standing up from a chair with arms (e.g., wheelchair, bedside commode, etc,.)?: Unable Help needed moving to and from a bed to chair (including a wheelchair)?: A Little Help needed walking in hospital room?: A Lot Help needed climbing 3-5 steps with a railing? : A Lot 6 Click Score: 13    End of Session   Activity Tolerance: Patient tolerated treatment well Patient left: in bed;with call bell/phone within reach;with bed alarm set Nurse Communication: Mobility status PT Visit Diagnosis: Other abnormalities of gait and mobility (R26.89);Unsteadiness on feet (R26.81)     Time: [id number] PT Time Calculation (min) (ACUTE ONLY): 16 min  Charges:  $Gait Training: 8-22 mins                    G Codes:       05/27/2017  Salcha BingKen Layonna Dobie, PT [phone]  (pager)   Eliseo GumKenneth V Mikel Hardgrove 05/27/2017, 4:07 PM

## 2017-05-27 NOTE — Progress Notes (Signed)
Patient ID: Nicole [name], female   DOB: 06/15/1928, 83 y.o.   MRN: [id number]   Pt arrived on unit with family. Pt and family educated on call bell systems, therapy schedule. Reviewed fall safety plan, Health Resource Notebook. Answered all questions and concerns at this time. Admission assessment as documented. Pt oriented to self/place only, impulsive with toileting according to sending RN. Telesitter has been ordered.

## 2017-05-28 ENCOUNTER — Inpatient Hospital Stay (HOSPITAL_COMMUNITY): Payer: Medicare Other | Admitting: Speech Pathology

## 2017-05-28 ENCOUNTER — Inpatient Hospital Stay (HOSPITAL_COMMUNITY): Payer: Medicare Other | Admitting: Physical Therapy

## 2017-05-28 ENCOUNTER — Inpatient Hospital Stay (HOSPITAL_COMMUNITY): Payer: Medicare Other | Admitting: Occupational Therapy

## 2017-05-28 DIAGNOSIS — E46 Unspecified protein-calorie malnutrition: Secondary | ICD-10-CM

## 2017-05-28 DIAGNOSIS — I169 Hypertensive crisis, unspecified: Secondary | ICD-10-CM

## 2017-05-28 DIAGNOSIS — R7303 Prediabetes: Secondary | ICD-10-CM

## 2017-05-28 DIAGNOSIS — D62 Acute posthemorrhagic anemia: Secondary | ICD-10-CM

## 2017-05-28 DIAGNOSIS — I10 Essential (primary) hypertension: Secondary | ICD-10-CM

## 2017-05-28 DIAGNOSIS — I6381 Other cerebral infarction due to occlusion or stenosis of small artery: Secondary | ICD-10-CM

## 2017-05-28 LAB — COMPREHENSIVE METABOLIC PANEL
ALK PHOS: 57 U/L (ref 38–126)
ALT: 15 U/L (ref 14–54)
ANION GAP: 7 (ref 5–15)
AST: 22 U/L (ref 15–41)
Albumin: 3 g/dL — ABNORMAL LOW (ref 3.5–5.0)
BILIRUBIN TOTAL: 0.6 mg/dL (ref 0.3–1.2)
BUN: 8 mg/dL (ref 6–20)
CALCIUM: 9 mg/dL (ref 8.9–10.3)
CO2: 27 mmol/L (ref 22–32)
Chloride: 106 mmol/L (ref 101–111)
Creatinine, Ser: 0.91 mg/dL (ref 0.44–1.00)
GFR, EST NON AFRICAN AMERICAN: 56 mL/min — AB (ref >60)
GLUCOSE: 117 mg/dL — AB (ref 65–99)
Potassium: 3.5 mmol/L (ref 3.5–5.1)
Sodium: 140 mmol/L (ref 135–145)
TOTAL PROTEIN: 5.4 g/dL — AB (ref 6.5–8.1)

## 2017-05-28 LAB — CBC WITH DIFFERENTIAL/PLATELET
Basophils Absolute: 0 10*3/uL (ref 0.0–0.1)
Basophils Relative: 0 %
Eosinophils Absolute: 0.3 10*3/uL (ref 0.0–0.7)
Eosinophils Relative: 4 %
HEMATOCRIT: 35 % — AB (ref 36.0–46.0)
HEMOGLOBIN: 11.2 g/dL — AB (ref 12.0–15.0)
LYMPHS ABS: 1 10*3/uL (ref 0.7–4.0)
Lymphocytes Relative: 11 %
MCH: 28.2 pg (ref 26.0–34.0)
MCHC: 32 g/dL (ref 30.0–36.0)
MCV: 88.2 fL (ref 78.0–100.0)
MONO ABS: 0.7 10*3/uL (ref 0.1–1.0)
MONOS PCT: 7 %
NEUTROS ABS: 6.9 10*3/uL (ref 1.7–7.7)
NEUTROS PCT: 78 %
Platelets: 241 10*3/uL (ref 150–400)
RBC: 3.97 MIL/uL (ref 3.87–5.11)
RDW: 14.9 % (ref 11.5–15.5)
WBC: 8.9 10*3/uL (ref 4.0–10.5)

## 2017-05-28 MED ORDER — PRO-STAT SUGAR FREE PO LIQD
30.0000 mL | Freq: Two times a day (BID) | ORAL | Status: DC
  Administered 2017-05-28 – 2017-06-09 (×16): 30 mL via ORAL
  Filled 2017-05-28 (×23): qty 30

## 2017-05-28 NOTE — Plan of Care (Signed)
Long term goals established 12/27 

## 2017-05-28 NOTE — Evaluation (Signed)
Physical Therapy Assessment and Plan  Patient Details  Name: Nicole [name] MRN: [id number] Date of Birth: December 06, 1928  PT Diagnosis: Abnormality of gait, Cognitive deficits, Difficulty walking, Impaired cognition and Muscle weakness Rehab Potential: Good ELOS: 10-14 days   Today's Date: 05/28/2017 PT Individual Time: 4431-5400 PT Individual Time Calculation (min): 70 min    Problem List:  Patient Active Problem List   Diagnosis Date Noted  . Hypertensive crisis   . Essential hypertension   . Prediabetes   . Hypoalbuminemia due to protein-calorie malnutrition ([place])   . Acute blood loss anemia   . Lacunar infarction 05/27/2017  . Hyperlipidemia   . Slow transit constipation   . Cerebral thrombosis with cerebral infarction 05/22/2017  . Pacemaker   . Heart block 05/21/2017  . Memory deficit 05/21/2017  . Acute encephalopathy 05/21/2017  . Mobitz type 2 second degree heart block   . Hypertensive urgency 05/20/2017    Past Medical History:  Past Medical History:  Diagnosis Date  . Hypertension   . Polio    Past Surgical History:  Past Surgical History:  Procedure Laterality Date  . ABDOMINAL HYSTERECTOMY    . CHOLECYSTECTOMY    . PACEMAKER IMPLANT N/A 05/21/2017   Procedure: PACEMAKER IMPLANT;  Surgeon: Evans Lance, MD;  Location: [facility];  Service: Cardiovascular;  Laterality: N/A;    Assessment & Plan Clinical Impression: Patient is a 83 y.o. year old female with recent admission to the hospital on 05/21/2017 with altered mental status no reports of recent trauma. In the ED patient bradycardic in the 86P systolic blood pressure 619J. EKG Mobitz type II block. Cranial CT reviewed, unremarkable for acute process. Troponin negative. MRI the brain completed showing acute lacunar infarct of the anterior left thalamus. Associated severe left PCA stenosis. Echocardiogram with ejection fraction of 09% grade 2 diastolic dysfunction. Cardiology service is  consulted plan for pacemaker implantation after initially patient not able to tolerate 05/21/2017. Blood pressure monitored on multiple antihypertensive medications. Carotid Dopplers in no ICA stenosis. Subcutaneous Lovenox for DVT prophylaxis.Patient has a Air cabin crew due to restlessness and limited awareness.  Patient transferred to CIR on 05/27/2017 .   Patient currently requires mod with mobility secondary to muscle weakness, decreased coordination, decreased attention, decreased awareness, decreased problem solving, decreased safety awareness and delayed processing and decreased standing balance, decreased postural control and decreased balance strategies.  Prior to hospitalization, patient was modified independent  with mobility and lived with Family in a House home.  Home access is 6Stairs to enter.  Patient will benefit from skilled PT intervention to maximize safe functional mobility, minimize fall risk and decrease caregiver burden for planned discharge home with 24 hour supervision.  Anticipate patient will benefit from follow up [place] at discharge.  PT - End of Session Activity Tolerance: Tolerates 30+ min activity with multiple rests Endurance Deficit: Yes PT Assessment Rehab Potential (ACUTE/IP ONLY): Good PT Barriers to Discharge: Behavior PT Barriers to Discharge Comments: cognitive deficits requiring supervision 24/7 PT Patient demonstrates impairments in the following area(s): Balance;Endurance;Motor;Safety PT Transfers Functional Problem(s): Bed Mobility;Bed to Chair;Car;Furniture PT Locomotion Functional Problem(s): Ambulation;Stairs PT Plan PT Intensity: Minimum of 1-2 x/day ,45 to 90 minutes PT Frequency: 5 out of 7 days PT Duration Estimated Length of Stay: 10-14 days PT Treatment/Interventions: Ambulation/gait training;Pain management;Stair training;Balance/vestibular training;DME/adaptive equipment instruction;Patient/family education;Therapeutic Activities;Wheelchair  propulsion/positioning;Cognitive remediation/compensation;Therapeutic Exercise;Community reintegration;Functional mobility training;UE/LE Strength taining/ROM;Discharge planning;Neuromuscular re-education;Splinting/orthotics;UE/LE Coordination activities PT Transfers Anticipated Outcome(s): supervision PT Locomotion Anticipated Outcome(s): supervision PT Recommendation Recommendations  for Other Services: Neuropsych consult Follow Up Recommendations: Home health PT Patient destination: Home Equipment Recommended: To be determined  Skilled Therapeutic Intervention Pt participated in skilled PT eval.  Pt eating lunch upon PT arrival but states she "does not like food like that".  PT encouraged pt to think of foods she enjoys, but pt states she only likes "grapes".  Pt able to perform squat pivot transfers with min A. Sit to stand and stand pivot transfers with mod A.  Gait with RW 35' x 5 throughout session with min A.  Gait without AD 20' x 2 with HHA with min/mod A.  Pt with posterior lean in standing requiring min/mod A to correct.  Pt with difficulty following directions throughout session due to cognitive deficits vs language barrier.  Pt with decreased safety with RW, requiring max cues for keeping RW close and max cues to avoid obstacles.  Simulated car transfer with min A, cues for safety.  Pt performs nustep for LE strengthening x 5 minutes level 3.  Pt left in bed with needs at hand.  PT Evaluation Precautions/Restrictions Precautions Precautions: Fall Precaution Comments: Confusion Restrictions Weight Bearing Restrictions: No Pain Pain Assessment Pain Assessment: No/denies pain Home Living/Prior Functioning Home Living Available Help at Discharge: Family;Available 24 hours/day Type of Home: House Home Access: Stairs to enter [employer] of Steps: 6 Entrance Stairs-Rails: Right;Left Home Layout: Two level;Able to live on main level with bedroom/bathroom Additional  Comments: Pt has a downstairs apt in son's and daughter's in law home .  She has hired caregiver assist 3-4 hours during the week   Lives With: Family Prior Function Comments: Uses RW for gait.  Patient independent with bathing, dressing, meal prep.  Family provided transportation. Son and daughter in law report pt with recent cognitive decline of ~5-6 mos.  They recently realized pt was not paying her bills, nor was she taking her meds appropriately.   They also report she has become somewhat sedentary   Cognition Overall Cognitive Status: Impaired/Different from baseline Arousal/Alertness: Awake/alert Orientation Level: Oriented to person;Disoriented to situation;Disoriented to time Focused Attention: Impaired Sustained Attention: Impaired Awareness: Impaired Awareness Impairment: Emergent impairment Safety/Judgment: Impaired Sensation Sensation Light Touch: Appears Intact Proprioception: Appears Intact Coordination Gross Motor Movements are Fluid and Coordinated: Yes Motor  Motor Motor - Skilled Clinical Observations: generalized weakness   Trunk/Postural Assessment  Cervical Assessment Cervical Assessment: (fwd head) Thoracic Assessment Thoracic Assessment: (mild kyphosis) Lumbar Assessment Lumbar Assessment: (posterior pelvic tilt) Postural Control Postural Control: Deficits on evaluation Righting Reactions: delayed  Balance Static Standing Balance Static Standing - Comment/# of Minutes: mod/max A due to posteior lean Dynamic Standing Balance Dynamic Standing - Comments: max A Extremity Assessment      RLE Assessment RLE Assessment: (grossly 3/5) LLE Assessment LLE Assessment: (grossly 3/5)   See Function Navigator for Current Functional Status.   Refer to Care Plan for Long Term Goals  Recommendations for other services: Neuropsych  Discharge Criteria: Patient will be discharged from PT if patient refuses treatment 3 consecutive times without medical  reason, if treatment goals not met, if there is a change in medical status, if patient makes no progress towards goals or if patient is discharged from hospital.  The above assessment, treatment plan, treatment alternatives and goals were discussed and mutually agreed upon: by patient  DONAWERTH,KAREN 05/28/2017, 2:21 PM

## 2017-05-28 NOTE — Progress Notes (Signed)
Trish Mage, RN  Rehab Admission Coordinator  Physical Medicine and Rehabilitation  PMR Pre-admission  Signed  Date of Service:  05/27/2017 1:52 PM       Related encounter: ED to Hosp-Admission (Discharged) from 05/20/2017 in [facility]      Signed            [] Hide copied text  [] Hover for details   PMR Admission Coordinator Pre-Admission Assessment  Patient: Nicole [name] is an 83 y.o., female MRN: [id number] DOB: 01-18-1929 Height: (pt unaware of height) Weight: 64.6 kg (142 lb 6.7 oz)                                                                                                                                                  Insurance Information HMO: No    PPO:       PCP:       IPA:       80/20:       OTHER:   PRIMARY:  Medicare A/B      Policy#: [id number]      Subscriber: patient CM Name:        Phone#:       Fax#:   Pre-Cert#:        Employer: Retired Benefits:  Phone #:       Name: Checked in Passport One source Eff. Date: 03/02/1996     Deduct:  $1340      Out of Pocket Max:  None      Life Max: N/A CIR: 100%      SNF: 100 days Outpatient: 80%     Co-Pay: 20% Home Health: 100%      Co-Pay: none DME: 80%     Co-Pay: 20% Providers: patient's choice  SECONDARY: BCBS      Policy#: [id number]      Subscriber:  patient CM Name:        Phone#:       Fax#:   Pre-Cert#:        Employer: Retired Benefits:  Phone #:  [phone]     Name:   Eff. Date:       Deduct:        Out of Pocket Max:        Life Max:   CIR:        SNF:   Outpatient:       Co-Pay:   Home Health:        Co-Pay:   DME:       Co-Pay:    Emergency Contact Information        Contact Information    Name Relation Home Work Mobile   Nicole [name], Nicole [name] Son [phone]       Current Medical History  Patient Admitting Diagnosis:Left PCA infarct causing aphasia , gait and blance disorder impacting  ADLs  History of Present Illness: An 83 y.o.right  handed femalewith history of hypertension,question historypolioper documentation. Per chart review patient lives downstairs apartment and son and daughter-in-law's home. Has a hired caregiver 3 hours 5 days a week. Used a rolling walker for ambulation. Independent with bathing and dressing. Son and daughter reported patient with cognitive decline over the past 5-6 months. Presented 05/21/2017 with altered mental status no reports of recent trauma. In the ED patient bradycardic in the 40s systolic blood pressure 200s. EKG Mobitz type II block. Cranial CTreviewed, unremarkable for acute process. Troponin negative. MRI the brain completed showing acute lacunar infarct of the anterior left thalamus. Associated severe left PCA stenosis. Echocardiogram with ejection fraction of 65% grade 2 diastolic dysfunction. Cardiology service is consulted plan for pacemaker implantation after initially patient not able to tolerate 05/21/2017. Blood pressure monitored on multiple antihypertensive medications. Carotid Dopplers in no ICA stenosis. Subcutaneous Lovenox for DVT prophylaxis.Patient has a Recruitment consultantsafety sitter due to restlessness and limited awareness.Physical and occupational therapy evaluations completed with recommendations of physical medicine rehabilitation consult.Patient to be admitted for a comprehensive inpatient rehabilitation program.   Total: 2=NIH  Past Medical History      Past Medical History:  Diagnosis Date  . Hypertension   . Polio     Family History  family history includes Cancer in her mother and sister.  Prior Rehab/Hospitalizations: No previous rehab  Has the patient had major surgery during 100 days prior to admission? No.  Son reports that patient had several back injections last year, but no surgery.  Current Medications   Current Facility-Administered Medications:  .  acetaminophen (TYLENOL) tablet 325-650 mg, 325-650 mg, Oral, Q4H PRN, Marinus Mawaylor, Gregg W, MD .   amLODipine (NORVASC) tablet 2.5 mg, 2.5 mg, Oral, Daily, Rolly SalterPatel, Pranav M, MD, 2.5 mg at 05/27/17 0837 .  aspirin chewable tablet 81 mg, 81 mg, Oral, Daily, Caryl PinaLindzen, Eric, MD, 81 mg at 05/27/17 0836 .  enoxaparin (LOVENOX) injection 40 mg, 40 mg, Subcutaneous, Q24H, Opyd, Timothy S, MD, 40 mg at 05/27/17 1214 .  hydrALAZINE (APRESOLINE) injection 5 mg, 5 mg, Intravenous, Q4H PRN, Regalado, Belkys A, MD, 5 mg at 05/26/17 0357 .  hydrALAZINE (APRESOLINE) tablet 50 mg, 50 mg, Oral, Q8H, Rolly SalterPatel, Pranav M, MD, 50 mg at 05/27/17 1303 .  lisinopril (PRINIVIL,ZESTRIL) tablet 40 mg, 40 mg, Oral, Daily, Rolly SalterPatel, Pranav M, MD, 40 mg at 05/27/17 0836 .  ondansetron (ZOFRAN) injection 4 mg, 4 mg, Intravenous, Q6H PRN, Marinus Mawaylor, Gregg W, MD .  ondansetron [facility](ZOFRAN) tablet 4 mg, 4 mg, Oral, Q6H PRN, 4 mg at 05/26/17 1139 **OR** [DISCONTINUED] ondansetron (ZOFRAN) injection 4 mg, 4 mg, Intravenous, Q6H PRN, Opyd, Lavone Neriimothy S, MD, 4 mg at 05/21/17 0853 .  pravastatin (PRAVACHOL) tablet 40 mg, 40 mg, Oral, q1800, Marvel PlanXu, Jindong, MD, 40 mg at 05/26/17 1731 .  senna-docusate (Senokot-S) tablet 1 tablet, 1 tablet, Oral, QHS PRN, Opyd, Timothy S, MD .  sodium chloride flush (NS) 0.9 % injection 3 mL, 3 mL, Intravenous, Q12H, Opyd, Timothy S, MD, 3 mL at 05/27/17 0933 .  zolpidem (AMBIEN) tablet 5 mg, 5 mg, Oral, QHS PRN, Rolly SalterPatel, Pranav M, MD  Patients Current Diet: Diet Heart Room service appropriate? Yes; Fluid consistency: Thin Diet - low sodium heart healthy  Precautions / Restrictions Precautions Precautions: Fall Precaution Comments: Confusion Restrictions Weight Bearing Restrictions: No   Has the patient had 2 or more falls or a fall with injury in the past year?Yes.  Son reports several  falls each week with scrapes to legs.  Prior Activity Level Household[place]  Does not drive.  Goes out on the deck in sunshine if weather is nice.  Home Assistive Devices / Equipment Home Equipment: Environmental consultantWalker  - 2 wheels, Gilmer MorCane - single point, Art gallery managerlectric scooter, Information systems managerhower seat - built in  Prior Device Use: Indicate devices/aids used by the patient prior to current illness, exacerbation or injury? 4 Wheeled IT trainerWalker and Cane  Prior Functional Level Prior Function Level of Independence: Independent with assistive device(s) Comments: Uses RW for gait.  Patient independent with bathing, dressing, meal prep.  Family provided transportation. Son and daughter in law report pt with recent cognitive decline of ~5-6 mos.  They recently realized pt was not paying her bills, nor was she taking her meds appropriately.   They also report she has become somewhat sedentary   Self Care: Did the patient need help bathing, dressing, using the toilet or eating?  Independent  Indoor Mobility: Did the patient need assistance with walking from room to room (with or without device)? Independent  Stairs: Did the patient need assistance with internal or external stairs (with or without device)? Independent  Functional Cognition: Did the patient need help planning regular tasks such as shopping or remembering to take medications? Independent  Current Functional Level Cognition  Arousal/Alertness: Awake/alert Overall Cognitive Status: Impaired/Different from baseline Current Attention Level: Selective Orientation Level: Oriented to person, Disoriented to place, Disoriented to time, Disoriented to situation Following Commands: Follows one step commands with increased time, Follows one step commands consistently Safety/Judgement: Decreased awareness of safety, Decreased awareness of deficits General Comments: improved attention and more talkative Attention: Focused, Sustained, Selective Focused Attention: Appears intact Sustained Attention: Appears intact Selective Attention: Impaired Selective Attention Impairment: Verbal basic, Functional basic Memory: Impaired Memory Impairment: Storage deficit, Decreased recall of  new information, Decreased short term memory Decreased Short Term Memory: Verbal basic, Functional basic Awareness: Impaired Awareness Impairment: Emergent impairment Problem Solving: Impaired Problem Solving Impairment: Verbal basic, Functional basic Safety/Judgment: Impaired    Extremity Assessment (includes Sensation/Coordination)  Upper Extremity Assessment: Overall WFL for tasks assessed  Lower Extremity Assessment: Defer to PT evaluation    ADLs  Overall ADL's : Needs assistance/impaired Eating/Feeding: Independent, Sitting Eating/Feeding Details (indicate cue type and reason): opens all containers independently Grooming: Wash/dry hands, Standing, Minimal assistance Grooming Details (indicate cue type and reason): pt attempting to use toothbrush for soap Upper Body Bathing: Minimal assistance, Sitting Lower Body Bathing: Moderate assistance, Sit to/from stand Upper Body Dressing : Minimal assistance, Sitting Upper Body Dressing Details (indicate cue type and reason): front opening gown Lower Body Dressing: Moderate assistance, Sit to/from stand Toilet Transfer: Minimal assistance, Stand-pivot, BSC, RW Toileting- Clothing Manipulation and Hygiene: Minimal assistance, Sit to/from stand Functional mobility during ADLs: Moderate assistance, +2 for safety/equipment, Rolling walker, Cueing for safety General ADL Comments: pt leaning to R upon initially standing    Mobility  Overal bed mobility: Needs Assistance Bed Mobility: Supine to Sit Supine to sit: Min assist, HOB elevated Sit to supine: Min guard General bed mobility comments: pulled up on therapist's hand    Transfers  Overall transfer level: Needs assistance Equipment used: Rolling walker (2 wheeled) Transfers: Sit to/from Stand Sit to Stand: Min assist General transfer comment: assist to rise and steady, cues for hand placement    Ambulation / Gait / Stairs / Wheelchair Mobility   Ambulation/Gait Ambulation/Gait assistance: Min assist, +2 safety/equipment Ambulation Distance (Feet): 80 Feet Assistive  device: Rolling walker (2 wheeled) Gait Pattern/deviations: Step-through pattern, Trunk flexed(drift R) General Gait Details: began ambulation with R lean and running into objects on R side, needed frequent vc's to attend to obstacles on R. Also frequently let go of RW to grasp furniture within room. Son reports that she did this PTA and was the cause of some of her falls. Min A from behind for support as well as min A from front to guide RW at times. Pt maintained trunk flexion throughout with some improvement when cued to step into RW. Son reports she ambulated with flexed trunk PTA as well. R lean improved with distance. Pt began to fatigue after ~60'.  Gait velocity: decreased Gait velocity interpretation: <1.8 ft/sec, indicative of risk for recurrent falls    Posture / Balance Dynamic Sitting Balance Sitting balance - Comments: could bring self to edge of chair without LOB Balance Overall balance assessment: Needs assistance Sitting-balance support: No upper extremity supported, Feet supported Sitting balance-Leahy Scale: Good Sitting balance - Comments: could bring self to edge of chair without LOB Standing balance support: Single extremity supported Standing balance-Leahy Scale: Poor Standing balance comment: pt able to maintain static balance at sink to wash hands     Special needs/care consideration BiPAP/CPAP No CPM No Continuous Drip IV No  Dialysis No       Life Vest No Oxygen No Special Bed No Trach Size No Wound Vac (area) No     Skin Bruises easily                           Bowel mgmt: Last BM 05/26/17 Bladder mgmt: Intermittent catheterizations Diabetic mgmt     Previous Home Environment Living Arrangements: Children Available Help at Discharge: Family, Available 24 hours/day Type of Home: House Home Layout: Two level, Able to live on  main level with bedroom/bathroom Home Access: Stairs to enter Entrance Stairs-Rails: Right, Left Entrance Stairs-Number of Steps: 6 Bathroom Shower/Tub: Other (comment)(walk in tub ) Bathroom Toilet: Handicapped height Additional Comments: Pt has a downstairs apt in son's and daughter's in law home .  She has hired caregiver assist 3-4 hours during the week   Discharge Living Setting Plans for Discharge Living Setting: House, Lives with (comment)(Lives with son and dtr-in-law.) Type of Home at Discharge: House Discharge Home Layout: Two level, Able to live on main level with bedroom/bathroom Alternate Level Stairs-Number of Steps: Flight, but does not go upstairs Discharge Home Access: Stairs to enter [employer] of Steps: 6-7 step entry Does the patient have any problems obtaining your medications?: No  Social/Family/Support Systems Patient Roles: Parent(Has 5 sons, daughter-in-law.  Recently lost boyfriend.) Contact Information: Cristela Felt - son Anticipated Caregiver: Son and dtr-in-law Anticipated Caregiver's Contact Information: [name] - son - [phone] Ability/Limitations of Caregiver: Family stay on the farm all day.  Has a caregiver 10 am to 1 pm 5 days a week. Caregiver Availability: Intermittent Discharge Plan Discussed with Primary Caregiver: Yes Is Caregiver In Agreement with Plan?: Yes Does Caregiver/Family have Issues with Lodging/Transportation while Pt is in Rehab?: No  Goals/Additional Needs Patient/Family Goal for Rehab: PT/OT/SLP supervision goals Expected length of stay: 10-14 days Cultural Considerations: From [place], [place].  Speaks English, accent is mild. Dietary Needs: Heart diet, thin liquids Equipment Needs: TBD Pt/Family Agrees to Admission and willing to participate: Yes Program Orientation Provided & Reviewed with Pt/Caregiver Including Roles  & Responsibilities: Yes  Decrease burden of Care through IP rehab admission:  N/A  Possible need for SNF placement upon discharge: Not anticipated  Patient Condition: This patient's medical and functional status has changed since the consult dated: 05/25/17 in which the Rehabilitation Physician determined and documented that the patient's condition is appropriate for intensive rehabilitative care in an inpatient rehabilitation facility. See "History of Present Illness" (above) for medical update. Functional changes are:  Currently requiring min assist to ambulate 80 feet RW. Patient's medical and functional status update has been discussed with the Rehabilitation physician and patient remains appropriate for inpatient rehabilitation. Will admit to inpatient rehab today.  Preadmission Screen Completed By:  Trish Mage, 05/27/2017 2:06 PM ______________________________________________________________________   Discussed status with Dr. Allena Katz on 05/27/17 at 1407 and received telephone approval for admission today.  Admission Coordinator:  Trish Mage, time 1407/Date 05/27/17             Cosigned by: Marcello Fennel, MD at 05/27/2017 2:40 PM  Revision History

## 2017-05-28 NOTE — Progress Notes (Signed)
Patient information reviewed and entered into eRehab system by Macaiah Mangal, RN, CRRN, PPS Coordinator.  Information including medical coding and functional independence measure will be reviewed and updated through discharge.     Per nursing patient was given "Data Collection Information Summary for Patients in Inpatient Rehabilitation Facilities with attached "Privacy Act Statement-Health Care Records" upon admission.  

## 2017-05-28 NOTE — Progress Notes (Addendum)
[facility] PHYSICAL MEDICINE & REHABILITATION     PROGRESS NOTE  Subjective/Complaints:  Patient seen sitting up in her chair this morning. She slept well overnight. She states she does not feel good this morning, but does not know why. Per nursing, patient reported headache earlier.  ROS: Denies CP, SOB, nausea, vomiting, diarrhea.  Objective: Vital Signs: Blood pressure (!) 190/37, pulse (!) 54, temperature 98.2 F (36.8 C), temperature source Oral, resp. rate 16, height 5\' 8"  (1.727 m), weight 61 kg (134 lb 7.7 oz), SpO2 95 %. No results found. Recent Labs    05/27/17 0441 05/28/17 0517  WBC 9.0 8.9  HGB 11.2* 11.2*  HCT 35.8* 35.0*  PLT 261 241   Recent Labs    05/27/17 0441 05/28/17 0517  NA 139 140  K 3.8 3.5  CL 109 106  GLUCOSE 120* 117*  BUN 13 8  CREATININE 0.98 0.91  CALCIUM 9.0 9.0   CBG (last 3)  No results for input(s): GLUCAP in the last 72 hours.  Wt Readings from Last 3 Encounters:  05/28/17 61 kg (134 lb 7.7 oz)  05/27/17 64.6 kg (142 lb 6.7 oz)    Physical Exam:  BP (!) 190/37 (BP Location: Right Arm)   Pulse (!) 54   Temp 98.2 F (36.8 C) (Oral)   Resp 16   Ht 5\' 8"  (1.727 m)   Wt 61 kg (134 lb 7.7 oz)   SpO2 95%   BMI 20.45 kg/m  Constitutional: She appears well-developed. Frail  HENT: Normocephalic and atraumatic.  Eyes: EOM are normal. No discharge.  Cardiovascular: Bradycardia   Respiratory: Effort normal and breath sounds normal.  GI: Bowel sounds are normal. Nondistended.. Musculoskeletal: She exhibits no edema or tenderness.  Neurological: A&Ox1 She was able to follow simple commands  Motor: 4+/5 in Bilateral delt, bi, tri, grip LLE: 4/5 proximal to distal  RLE: 4/5 proximal to distal   Skin: Skin is warm and dry.  Psychiatric: Her affect is blunt. Her speech is delayed and slurred. She is slowed. Cognition and memory are impaired.    Assessment/Plan: 1. Functional deficits secondary to left PCA infarction which require  3+ hours per day of interdisciplinary therapy in a comprehensive inpatient rehab setting. Physiatrist is providing close team supervision and 24 hour management of active medical problems listed below. Physiatrist and rehab team continue to assess barriers to discharge/monitor patient progress toward functional and medical goals.  Function:  Bathing Bathing position      Bathing parts      Bathing assist        Upper Body Dressing/Undressing Upper body dressing                    Upper body assist        Lower Body Dressing/Undressing Lower body dressing                                  Lower body assist        Toileting Toileting          Toileting assist     Transfers Chair/bed transfer             Locomotion Ambulation           Wheelchair          Cognition Comprehension    Expression    Social Interaction    Problem Solving  Memory      Medical Problem List and Plan: 1.  Aphasia with gait and balance disorder secondary to left PCA infarction   Begin CIR 2.  DVT Prophylaxis/Anticoagulation: Subcutaneous Lovenox 3. Pain Management: Hydrocodone as needed 4. Mood/memory loss: Provide emotional support. Provide tele sitter for safety 5. Neuropsych: This patient is not capable of making decisions on her own behalf. 6. Skin/Wound Care: Routine skin checks 7. Fluids/Electrolytes/Nutrition: Routine I&O's    BMP within acceptable range on 12/27 8. Hypertension: Norvasc 2.5 mg daily, Lisinopril 40 mg daily, hydralazine 50 mg every 8 hours   Currently with hypertensive crisis, monitor with increased mobility 9. Bradycardia. Attempts at placement of pacemaker 05/21/2017 unsuccessful. Plan to continue to monitor. 10. Constipation. Laxative assistance 11. Hyperlipidemia. Pravachol 12. Prediabetes   Relatively controlled on 12/27 13. Hypoalbuminemia   Supplement initiated on 12/27 14. Acute blood loss anemia   Hemoglobin 11.2  on 12/27   Continue to monitor   LOS (Days) 1 A FACE TO FACE EVALUATION WAS PERFORMED  Ankit Karis Jubanil Patel 05/28/2017 8:42 AM

## 2017-05-28 NOTE — IPOC Note (Signed)
Overall Plan of Care [facility](IPOC) Patient Details Name: Nicole [name] MRN: [id number] DOB: 1929/05/29  Admitting Diagnosis: Lacunar infarction  Hospital Problems: Active Problems:   Lacunar infarction   Hypertensive crisis   Essential hypertension   Prediabetes   Hypoalbuminemia due to protein-calorie malnutrition (HCC)   Acute blood loss anemia     Functional Problem List: Nursing Behavior, Safety, Medication Management, Nutrition, Bowel, Bladder, Skin Integrity  PT Balance, Endurance, Motor, Safety  OT Balance, Cognition, Safety, Sensory, Motor, Perception, Endurance  SLP Cognition, Linguistic  TR         Basic ADL's: OT Eating, Grooming, Bathing, Dressing, Toileting     Advanced  ADL's: OT       Transfers: PT Bed Mobility, Bed to Chair, Car, Occupational psychologisturniture  OT Toilet, Research scientist (life sciences)Tub/Shower     Locomotion: PT Ambulation, Stairs     Additional Impairments: OT None  SLP Communication, Social Cognition expression Problem Solving, Memory, Attention, Awareness  TR      Anticipated Outcomes Item Anticipated Outcome  Self Feeding mod i   Swallowing      Basic self-care  S  Toileting  S   Bathroom Transfers S   Bowel/Bladder  Remain continent of B&B with Mod I Assistance  Transfers  supervision  Locomotion  supervision  Communication  supervision   Cognition  min assist   Pain     Safety/Judgment  Remain free of falls, utilize call bell with Mod I Assistance    Therapy Plan: PT Intensity: Minimum of 1-2 x/day ,45 to 90 minutes PT Frequency: 5 out of 7 days PT Duration Estimated Length of Stay: 10-14 days OT Intensity: Minimum of 1-2 x/day, 45 to 90 minutes OT Frequency: 5 out of 7 days SLP Intensity: Minumum of 1-2 x/day, 30 to 90 minutes SLP Frequency: 3 to 5 out of 7 days SLP Duration/Estimated Length of Stay: 10-14 days     Team Interventions: Nursing Interventions Patient/Family Education, Disease Management/Prevention, Discharge Planning, Cognitive  Remediation/Compensation, Medication Management, Bladder Management, Bowel Management, Skin Care/Wound Management, Psychosocial Support  PT interventions Ambulation/gait training, Pain management, Stair training, Warden/rangerBalance/vestibular training, DME/adaptive equipment instruction, Patient/family education, Therapeutic Activities, Wheelchair propulsion/positioning, Cognitive remediation/compensation, Therapeutic Exercise, Community reintegration, Functional mobility training, UE/LE Strength taining/ROM, Discharge planning, Neuromuscular re-education, Splinting/orthotics, UE/LE Coordination activities  OT Interventions Discharge planning, DME/adaptive equipment instruction, Functional mobility training, Therapeutic Activities, Therapeutic Exercise, UE/LE Coordination activities, Self Care/advanced ADL retraining, Patient/family education, Community reintegration, Cognitive remediation/compensation  SLP Interventions Cognitive remediation/compensation, Financial traderCueing hierarchy, Environmental controls, Internal/external aids, Speech/Language facilitation, Patient/family education  TR Interventions    SW/CM Interventions Discharge Planning, Psychosocial Support, Patient/Family Education   Barriers to Discharge MD  Medical stability, Lack of/limited family support and Behavior  Nursing Medication compliance, Nutrition means, Behavior, Medical stability    PT Behavior cognitive deficits requiring supervision 24/7  OT Decreased caregiver support decreased IND and safety awareness   SLP      SW       Team Discharge Planning: Destination: PT-Home ,OT- Home , SLP-Home Projected Follow-up: PT-Home health PT, OT-  Home health OT, 24 hour supervision/assistance, SLP-Home Health SLP, Outpatient SLP, 24 hour supervision/assistance Projected Equipment Needs: PT-To be determined, OT- To be determined, SLP-None recommended by SLP Equipment Details: PT- , OT-  Patient/family involved in discharge planning: PT- Patient,   OT-Patient, SLP-Patient unable/family or caregive not available  MD ELOS: 10-14 days. Medical Rehab Prognosis:  Good Assessment: 83 y.o.right handed femalewith history of hypertension, question history polio per documentation. Presented 05/21/2017 with altered mental  status no reports of recent trauma. In the ED patient bradycardic in the 40s systolic blood pressure 200s. EKG Mobitz type II block. Cranial CT reviewed, unremarkable for acute process. Troponin negative. MRI the brain completed showing acute lacunar infarct of the anterior left thalamus. Associated severe left PCA stenosis. Echocardiogram with ejection fraction of 65% grade 2 diastolic dysfunction. Cardiology service is consulted plan for pacemaker implantation after initially patient not able to tolerate 05/21/2017. Blood pressure monitored on multiple antihypertensive medications. Carotid Dopplers in no ICA stenosis. Patient with resultant functional deficits with safety requiring telesitter due to restlessness and limited awareness, mobility, self-care.Will set goals for Supervision with PT/OT and Supervision/Min A with SLP.  See Team Conference Notes for weekly updates to the plan of care

## 2017-05-28 NOTE — Evaluation (Signed)
Speech Language Pathology Assessment and Plan  Patient Details  Name: Nicole [name] MRN: [id number] Date of Birth: 1929-05-25  SLP Diagnosis: Aphasia;Cognitive Impairments  Rehab Potential: Fair ELOS: 10-14 days     Today's Date: 05/28/2017 SLP Individual Time: 1003-1100 SLP Individual Time Calculation (min): 57 min   Problem List:  Patient Active Problem List   Diagnosis Date Noted  . Hypertensive crisis   . Essential hypertension   . Prediabetes   . Hypoalbuminemia due to protein-calorie malnutrition ([place])   . Acute blood loss anemia   . Lacunar infarction 05/27/2017  . Hyperlipidemia   . Slow transit constipation   . Cerebral thrombosis with cerebral infarction 05/22/2017  . Pacemaker   . Heart block 05/21/2017  . Memory deficit 05/21/2017  . Acute encephalopathy 05/21/2017  . Mobitz type 2 second degree heart block   . Hypertensive urgency 05/20/2017   Past Medical History:  Past Medical History:  Diagnosis Date  . Hypertension   . Polio    Past Surgical History:  Past Surgical History:  Procedure Laterality Date  . ABDOMINAL HYSTERECTOMY    . CHOLECYSTECTOMY    . PACEMAKER IMPLANT N/A 05/21/2017   Procedure: PACEMAKER IMPLANT;  Surgeon: Evans Lance, MD;  Location: [place] [facility];  Service: Cardiovascular;  Laterality: N/A;    Assessment / Plan / Recommendation Clinical Impression   Nicole [name] is a 83 y.o. right handed female with history of hypertension, question history polio per documentation. Per chart review patient lives downstairs apartment and son and daughter-in-law's home. Has a hired caregiver 3-4 hours during the week. Used a rolling walker for ambulation. Independent with bathing and dressing. Son and daughter reported patient with cognitive decline over the past 5-6 months. Presented 05/21/2017 with altered mental status no reports of recent trauma. In the ED patient bradycardic in the 77S systolic blood pressure 142L. EKG Mobitz  type II block. Cranial CT reviewed, unremarkable for acute process. Troponin negative. MRI the brain completed showing acute lacunar infarct of the anterior left thalamus. Associated severe left PCA stenosis. Echocardiogram with ejection fraction of 95% grade 2 diastolic dysfunction. Cardiology service is consulted plan for pacemaker implantation after initially patient not able to tolerate 05/21/2017. Blood pressure monitored on multiple antihypertensive medications. Carotid Dopplers in no ICA stenosis. Subcutaneous Lovenox for DVT prophylaxis. Patient has a Air cabin crew due to restlessness and limited awareness. Physical and occupational therapy evaluations completed with recommendations of physical medicine rehabilitation consult. Patient was admitted for a comprehensive rehabilitation program.  SLP evaluation was completed on 05/28/2017 with the following results:  Pt presents with a mild expressive aphasia characterized by impaired confrontational and responsive naming, semantic and phonemic paraphasic errors, as well as echolalia.  Additionally, pt has moderately severe cognitive impairment marked by poor task initiation and overall decreased functional problem solving, decreased emergent awareness of deficits, decreased recall of information, and decreased sustained attention to tasks.   Given the abovementioned deficits, pt would benefit from skilled ST while inpatient in order to maximize functional independence and reduce burden of care prior to discharge.  Anticipate that pt will need 24/7 supervision at discharge in addition to [place] follow up at next level of care.     Skilled Therapeutic Interventions          Cognitive-linguistic evaluation completed.  See summary above.  Pt needed mod assist semantic and phonemic cues for word finding during both structured and unstructured verbal tasks.  She also needed max assist to recall 1 out  of 3 items on delayed recall subtest.  Pt also benefited from  frequent mod-max assist level cues for redirection to tasks due to poor task initiation and decreased sustained attention.  Pt was returned to bed and left with bed alarm set and call bell within reach.  Telesitter in use.      SLP Assessment  Patient will need skilled [place] [facility] during CIR admission    Recommendations  Patient destination: Home Follow up Recommendations: Home Health SLP;Outpatient SLP;24 hour supervision/assistance Equipment Recommended: None recommended by SLP    SLP Frequency 3 to 5 out of 7 days   SLP Duration  SLP Intensity  SLP Treatment/Interventions 10-14 days   Minumum of 1-2 x/day, 30 to 90 minutes  Cognitive remediation/compensation;Cueing hierarchy;Environmental controls;Internal/external aids;Speech/Language facilitation;Patient/family education    Pain Pain Assessment Pain Assessment: No/denies pain  Prior Functioning Cognitive/Linguistic Baseline: Baseline deficits Baseline deficit details: cognitive decline over the last 5-6 months  Type of Home: House  Lives With: Family Available Help at Discharge: Family;Available 24 hours/day Vocation: Retired  Function:  Eating Eating                 Cognition Comprehension Comprehension assist level: Understands basic 75 - 89% of the time/ requires cueing 10 - 24% of the time  Expression   Expression assist level: Expresses basic 50 - 74% of the time/requires cueing 25 - 49% of the time. Needs to repeat parts of sentences.  Social Interaction Social Interaction assist level: Interacts appropriately 50 - 74% of the time - May be physically or verbally inappropriate.  Problem Solving Problem solving assist level: Solves basic 50 - 74% of the time/requires cueing 25 - 49% of the time  Memory Memory assist level: Recognizes or recalls 25 - 49% of the time/requires cueing 50 - 75% of the time   Short Term Goals: Week 1: SLP Short Term Goal 1 (Week 1): Pt will name  familiar objects with min assist cues to recognize and correct verbal errors.   SLP Short Term Goal 2 (Week 1): Pt will utilize compensatory word finding strategies during structured and unstructured verbal tasks with min assist cues.   SLP Short Term Goal 3 (Week 1): Pt will utilize external aids to recall basic, daily information with mod assist verbal cues.   SLP Short Term Goal 4 (Week 1): Pt will complete basic, familiar tasks with mod assist for functional problem solving (i.e. task initiation, sequencing, organization).   SLP Short Term Goal 5 (Week 1): Pt will return demonstration of at least 2 safety precautions during functional tasks with mod assist verbal cues.    Refer to Care Plan for Long Term Goals  Recommendations for other services: None   Discharge Criteria: Patient will be discharged from SLP if patient refuses treatment 3 consecutive times without medical reason, if treatment goals not met, if there is a change in medical status, if patient makes no progress towards goals or if patient is discharged from hospital.  The above assessment, treatment plan, treatment alternatives and goals were discussed and mutually agreed upon: No family available/patient unable  Emilio Math 05/28/2017, 4:11 PM

## 2017-05-28 NOTE — Progress Notes (Signed)
Kirsteins, Victorino SparrowAndrew E, MD  Physician  Physical Medicine and Rehabilitation  Consult Note  Signed  Date of Service:  05/25/2017 6:53 AM       Related encounter: ED to Hosp-Admission (Discharged[facility]      Signed      Expand All Collapse All       [] Hide copied text  [] Hover for details        Physical Medicine and Rehabilitation Consult Reason for Consult: Decreased functional mobility Referring Physician: [facility]   HPI: Nicole [name] is a 83 y.o. right handed female with history of hypertension, polio. Per chart review patient lives downstairs apartment and son and daughter-in-law's home. Has a hired caregiver 3-4 hours during the week. Used a rolling walker for ambulation. Independent with bathing and dressing. Son and daughter reported patient with cognitive decline over the past 5-6 months. Presented 05/21/2017 with altered mental status no reports of recent trauma. In the ED patient bradycardic in the 40s systolic blood pressure 200s. EKG Mobitz type II block. Cranial CT scan negative. Troponin negative. MRI the brain completed showing acute lacunar infarct of the anterior left thalamus. Associated severe left PCA stenosis. Echocardiogram with ejection fraction of 65% grade 2 diastolic dysfunction. Cardiology service is consulted plan for pacemaker implantation after initially patient not able to tolerate 05/21/2017. Blood pressure monitored on multiple antihypertensive medications. Carotid Dopplers in no ICA stenosis. Subcutaneous Lovenox for DVT prophylaxis. Patient has a Recruitment consultantsafety sitter due to restlessness and limited awareness. Physical and occupational therapy evaluations completed with recommendations of physical medicine rehabilitation consult.   Review of Systems  Unable to perform ROS: Mental acuity       Past Medical History:  Diagnosis Date  . Hypertension   . Polio         Past Surgical History:    Procedure Laterality Date  . ABDOMINAL HYSTERECTOMY    . CHOLECYSTECTOMY    . PACEMAKER IMPLANT N/A 05/21/2017   Procedure: PACEMAKER IMPLANT;  Surgeon: Marinus Mawaylor, Gregg W, MD;  [facility];  Service: Cardiovascular;  Laterality: N/A;        Family History  Problem Relation Age of Onset  . Cancer Mother        stomach  . Cancer Sister        breast   Social History:  reports that  has never smoked. she has never used smokeless tobacco. Her alcohol and drug histories are not on file. Allergies: No Known Allergies       Medications Prior to Admission  Medication Sig Dispense Refill  . losartan (COZAAR) 100 MG tablet Take 100 mg by mouth daily.      Home: Home Living Family/patient expects to be discharged to:: Private residence Living Arrangements: Children Available Help at Discharge: Family, Available 24 hours/day Type of Home: House Home Access: Stairs to enter Entergy CorporationEntrance Stairs-Number of Steps: 6 Entrance Stairs-Rails: Right, Left Home Layout: Two level, Able to live on main level with bedroom/bathroom Bathroom Shower/Tub: Other (comment)(walk in tub ) Bathroom Toilet: Handicapped height Home Equipment: Environmental consultantWalker - 2 wheels, Cane - single point, Art gallery managerlectric scooter, Shower seat - built in Additional Comments: Pt has a downstairs apt in son's and daughter's in law home .  She has hired caregiver assist 3-4 hours during the week   Functional History: Prior Function Level of Independence: Independent with assistive device(s) Comments: Uses RW for gait.  Patient independent with bathing, dressing, meal prep.  Family provided  transportation. Son and daughter in law report pt with recent cognitive decline of ~5-6 mos.  They recently realized pt was not paying her bills, nor was she taking her meds appropriately.   They also report she has become somewhat sedentary  Functional Status:  Mobility: Bed Mobility Overal bed mobility: Needs Assistance Bed  Mobility: Supine to Sit, Sit to Supine Supine to sit: Min assist, HOB elevated Sit to supine: Min guard General bed mobility comments: Pt requires increased amount of time to move to EOB.  Mod verbal cues provided for problem solving.   She spontaneously returned to supine  Transfers Overall transfer level: Needs assistance Transfers: Sit to/from Stand Sit to Stand: Mod assist General transfer comment: Pt moved sit to stand with mod A to steady.     ADL: ADL Overall ADL's : Needs assistance/impaired Eating/Feeding: Minimal assistance, Sitting Grooming: Brushing hair, Supervision/safety, Sitting Upper Body Bathing: Minimal assistance, Sitting Lower Body Bathing: Moderate assistance, Sit to/from stand Upper Body Dressing : Minimal assistance, Sitting Lower Body Dressing: Moderate assistance, Sit to/from stand Toilet Transfer: Moderate assistance, Stand-pivot, BSC Toileting- Clothing Manipulation and Hygiene: Moderate assistance, Sit to/from stand Functional mobility during ADLs: Moderate assistance General ADL Comments: Pt requires mod A for balance   Cognition: Cognition Overall Cognitive Status: Impaired/Different from baseline Orientation Level: Oriented to person, Disoriented to place, Disoriented to time, Disoriented to situation Cognition Arousal/Alertness: Awake/alert Behavior During Therapy: WFL for tasks assessed/performed Overall Cognitive Status: Impaired/Different from baseline Area of Impairment: Orientation, Attention, Memory, Following commands, Safety/judgement, Awareness, Problem solving Orientation Level: Disoriented to, Place, Time, Situation Current Attention Level: Sustained Memory: Decreased short-term memory Following Commands: Follows one step commands with increased time, Follows one step commands consistently Safety/Judgement: Decreased awareness of safety, Decreased awareness of deficits Problem Solving: Slow processing, Difficulty sequencing, Requires  verbal cues, Requires tactile cues General Comments: Patient participating more easily this pm.    Blood pressure (!) 162/140, pulse (!) 50, temperature 98.6 F (37 C), temperature source Oral, resp. rate 18, weight 62.2 kg (137 lb 2 oz), SpO2 97 %. Physical Exam  Vitals reviewed. HENT:  Head: Normocephalic.  Eyes: EOM are normal.  Neck: Normal range of motion. Neck supple. No thyromegaly present.  Cardiovascular:  Cardiac rate controlled  Respiratory: Effort normal and breath sounds normal. No respiratory distress.  GI: Soft. Bowel sounds are normal. She exhibits no distension.  Neurological: She is alert.  Pleasantly confused. Exam was limited due to mental status. She was able to follow simple commands  Motor 4/5 in Bilateral delt, bi, tri, grip, HF, KE, ADF Sensation- reported as equal LT in BUE and BLE Word finding deficits, difficulty with naming, able to repeat            Assessment/Plan: Diagnosis: Left PCA infarct causing aphasia , gait and blance disorder impacting ADLs 1. Does the need for close, 24 hr/day medical supervision in concert with the patient's rehab needs make it unreasonable for this patient to be served in a less intensive setting? Yes 2. Co-Morbidities requiring supervision/potential complications: Mobitz 2 heart block with bradycardia,diastolic CHF, HTN 3. Due to bladder management, bowel management, safety, skin/wound care, disease management, medication administration, pain management and patient education, does the patient require 24 hr/day rehab nursing? Yes 4. Does the patient require coordinated care of a physician, rehab nurse, PT (1-2 hrs/day, 5 days/week), OT (1-2 hrs/day, 5 days/week) and SLP (.5-1 hrs/day, 5 days/week) to address physical and functional deficits in the context of the above medical diagnosis(es)?  Yes Addressing deficits in the following areas: balance, endurance, locomotion, strength, transferring, bowel/bladder control,  bathing, dressing, feeding, grooming, toileting, cognition, speech, language, swallowing and psychosocial support 5. Can the patient actively participate in an intensive therapy program of at least 3 hrs of therapy per day at least 5 days per week? Yes 6. The potential for patient to make measurable gains while on inpatient rehab is excellent 7. Anticipated functional outcomes upon discharge from inpatient rehab are supervision  with PT, supervision with OT, supervision with SLP. 8. Estimated rehab length of stay to reach the above functional goals is: 10-14d 9. Anticipated D/C setting: Home 10. Anticipated post D/C treatments: HH therapy 11. Overall Rehab/Functional Prognosis: excellent  RECOMMENDATIONS: This patient's condition is appropriate for continued rehabilitative care in the following setting: CIR Patient has agreed to participate in recommended program. Yes Note that insurance prior authorization may be required for reimbursement for recommended care.  Comment: will need telesitter   Charlton AmorDaniel J Angiulli, PA-C 05/25/2017          Revision History                        Routing History

## 2017-05-28 NOTE — Evaluation (Signed)
Occupational Therapy Assessment and Plan  Patient Details  Name: Anntionette Madkins MRN: [id number] Date of Birth: 06/28/1928  OT Diagnosis: altered mental status and muscle weakness (generalized) Rehab Potential: Rehab Potential (ACUTE ONLY): Good ELOS:     Today's Date: 05/28/2017 OT Individual Time: 0800-0900 OT Individual Time Calculation (min): 60 min     Problem List:  Patient Active Problem List   Diagnosis Date Noted  . Hypertensive crisis   . Essential hypertension   . Prediabetes   . Hypoalbuminemia due to protein-calorie malnutrition ([place])   . Acute blood loss anemia   . Lacunar infarction 05/27/2017  . Hyperlipidemia   . Slow transit constipation   . Cerebral thrombosis with cerebral infarction 05/22/2017  . Pacemaker   . Heart block 05/21/2017  . Memory deficit 05/21/2017  . Acute encephalopathy 05/21/2017  . Mobitz type 2 second degree heart block   . Hypertensive urgency 05/20/2017    Past Medical History:  Past Medical History:  Diagnosis Date  . Hypertension   . Polio    Past Surgical History:  Past Surgical History:  Procedure Laterality Date  . ABDOMINAL HYSTERECTOMY    . CHOLECYSTECTOMY    . PACEMAKER IMPLANT N/A 05/21/2017   Procedure: PACEMAKER IMPLANT;  Surgeon: Evans Lance, MD;  Location: [facility];  Service: Cardiovascular;  Laterality: N/A;    Assessment & Plan Clinical Impression: Patient is a 83 y.o. year old female with recent admission to the hospital on 83 y.o.right handed femalewith history of hypertension, question history polio per documentation. Per chart review patient lives downstairs apartment and son and daughter-in-law's home. Has a hired caregiver 3-4 hours during the week. Used a rolling walker for ambulation. Independent with bathing and dressing. Son and daughter reported patient with cognitive decline over the past 5-6 months. Presented 05/21/2017 with altered mental status no reports of recent trauma. In the  ED patient bradycardic in the 17E systolic blood pressure 081K. EKG Mobitz type II block. Cranial CT reviewed, unremarkable for acute process. Troponin negative. MRI the brain completed showing acute lacunar infarct of the anterior left thalamus. Associated severe left PCA stenosis. Echocardiogram with ejection fraction of 48% grade 2 diastolic dysfunction. Cardiology service is consulted plan for pacemaker implantation after initially patient not able to tolerate 05/21/2017. Blood pressure monitored on multiple antihypertensive medications. Carotid Dopplers in no ICA stenosis. Subcutaneous Lovenox for DVT prophylaxis.    Patient transferred to CIR on 05/27/2017 .    Patient currently requires min with basic self-care skills secondary to decreased coordination and decreased motor planning, decreased motor planning, decreased attention, decreased awareness, decreased safety awareness and decreased memory and decreased sitting balance, decreased standing balance, decreased postural control and decreased balance strategies.  Prior to hospitalization, patient could complete ADLs with IND  Patient will benefit from skilled intervention to decrease level of assist with basic self-care skills prior to discharge home with care partner.  Anticipate patient will require 24 hour supervision and follow up home health.  OT - End of Session Activity Tolerance: Tolerates 30+ min activity with multiple rests Endurance Deficit: Yes OT Assessment Rehab Potential (ACUTE ONLY): Good OT Barriers to Discharge: Decreased caregiver support OT Barriers to Discharge Comments: decreased IND and safety awareness  OT Patient demonstrates impairments in the following area(s): Balance;Cognition;Safety;Sensory;Motor;Perception;Endurance OT Basic ADL's Functional Problem(s): Eating;Grooming;Bathing;Dressing;Toileting OT Transfers Functional Problem(s): Toilet;Tub/Shower OT Additional Impairment(s): None OT Plan OT Intensity:  Minimum of 1-2 x/day, 45 to 90 minutes OT Frequency: 5 out of 7 days  OT Treatment/Interventions: Discharge planning;DME/adaptive equipment instruction;Functional mobility training;Therapeutic Activities;Therapeutic Exercise;UE/LE Coordination activities;Self Care/advanced ADL retraining;Patient/family education;Community reintegration;Cognitive remediation/compensation OT Self Feeding Anticipated Outcome(s): mod i  OT Basic Self-Care Anticipated Outcome(s): S OT Toileting Anticipated Outcome(s): S OT Bathroom Transfers Anticipated Outcome(s): S  OT Recommendation Patient destination: Home Follow Up Recommendations: Home health OT;24 hour supervision/assistance Equipment Recommended: To be determined   Skilled Therapeutic Intervention Chart review, physical assessment, development or POC completed.  Treatment session focused on ADLs/self care training, transfer training, pt education, safety awareness/compensatory technique training, AE/AD training.  Upon entering, pt supine in room and agreeable to AM ADLs. Pt completed bed mob with S to sit EOB. Required several minutes while sitting upright d/t nausea. Nurse administered anti-nausea meds. Pt transferred from EOb to w/c with mod A with SPT with v/c for hand/foot placement. Pt noted to be weak. Completed shower transfer from w/c<>TTB with mod A and v/c for using grabbars. Completed UB d/b with min A, LB d/b with mod A in sit/stand level with v/c. Pt completed grooming standing at sink with min A for balance. Pt educated on safety awareness in room. Set up for eating breakfast with needs met.    OT Evaluation Precautions/Restrictions  Precautions Precautions: Fall Precaution Comments: Confusion Restrictions Weight Bearing Restrictions: No   Vital Signs Therapy Vitals Temp: 98.4 F (36.9 C) Temp Source: Oral Pulse Rate: (!) 51 Resp: 16 BP: (!) 180/40 Patient Position (if appropriate): Lying Oxygen Therapy SpO2: 96 % O2 Device: Not  Delivered Pain Pain Assessment Pain Assessment: No/denies pain Home Living/Prior Functioning Home Living Family/patient expects to be discharged to:: Private residence Living Arrangements: Children Available Help at Discharge: Family, Available 24 hours/day Type of Home: House Home Access: Stairs to enter Entrance Stairs-Number of Steps: 6 Entrance Stairs-Rails: Right, Left Home Layout: Two level, Able to live on main level with bedroom/bathroom Bathroom Shower/Tub: Other (comment)(walk in tub) Bathroom Toilet: Handicapped height Additional Comments: Pt has a downstairs apt in son's and daughter's in law home .  She has hired caregiver assist 3-4 hours during the week   Lives With: Family IADL History Homemaking Responsibilities: No Prior Function  Able to Take Stairs?: Yes Vocation: Retired Comments: Uses RW for gait.  Patient independent with bathing, dressing, meal prep.  Family provided transportation. Son and daughter in law report pt with recent cognitive decline of ~5-6 mos.  They recently realized pt was not paying her bills, nor was she taking her meds appropriately.   They also report she has become somewhat sedentary  ADL ADL Eating: Set up Where Assessed-Eating: Wheelchair Grooming: Minimal assistance Where Assessed-Grooming: Standing at sink Upper Body Bathing: Minimal assistance Where Assessed-Upper Body Bathing: Shower Lower Body Bathing: Minimal assistance Where Assessed-Lower Body Bathing: Shower Upper Body Dressing: Setup Where Assessed-Upper Body Dressing: Wheelchair Lower Body Dressing: Moderate assistance Where Assessed-Lower Body Dressing: Wheelchair Toileting: Minimal assistance Where Assessed-Toileting: Toilet Toilet Transfer: Moderate assistance Toilet Transfer Method: Stand pivot Toilet Transfer Equipment: Grab bars Walk-In Shower Transfer: Moderate assistance Walk-In Shower Transfer Method: Stand pivot Walk-In Shower Equipment: Shower seat with  back, Grab bars(from w/c) ADL Comments: cues for thoroughness   Praxis Praxis: Impaired Praxis Impairment Details: Ideation Cognition Overall Cognitive Status: Impaired/Different from baseline Arousal/Alertness: Awake/alert Orientation Level: Person Year: Other (Comment)(1900s) Day of Week: Incorrect Memory: Impaired Memory Impairment: Storage deficit Decreased Short Term Memory: Verbal basic;Functional basic Immediate Memory Recall: Sock;Blue;Bed Attention: Sustained Focused Attention: Impaired Sustained Attention: Impaired Sustained Attention Impairment: Functional basic;Verbal basic Selective Attention: Impaired Awareness: Impaired   Awareness Impairment: Emergent impairment Problem Solving: Impaired Problem Solving Impairment: Functional basic Executive Function: (all impaired due to low level underlying deficits ) Safety/Judgment: Impaired Comments: telesitter in use Sensation Sensation Light Touch: Appears Intact Proprioception: Appears Intact Coordination Gross Motor Movements are Fluid and Coordinated: Yes Fine Motor Movements are Fluid and Coordinated: No Motor  Motor Motor: Within Functional Limits Motor - Skilled Clinical Observations: generalized weakness Mobility  Bed Mobility Bed Mobility: Supine to Sit;Sit to Supine Supine to Sit: 5: Supervision Supine to Sit Details: Verbal cues for precautions/safety Sit to Supine: 5: Supervision Sit to Supine - Details: Verbal cues for safe use of DME/AE Transfers Transfers: Sit to Stand Sit to Stand: 4: Min assist Sit to Stand Details: Verbal cues for precautions/safety  Trunk/Postural Assessment  Cervical Assessment Cervical Assessment: (fwd head) Thoracic Assessment Thoracic Assessment: (mild kyphosis) Lumbar Assessment Lumbar Assessment: (posterior pelvic tilt) Postural Control Postural Control: Within Functional Limits Righting Reactions: delayed  Balance Balance Balance Assessed: Yes Dynamic Sitting  Balance Sitting balance - Comments: could bring self to edge of chair without LOB to completed ADLs,.  Static Standing Balance Static Standing - Comment/# of Minutes: mod/max A due to posterior lean  Dynamic Standing Balance Dynamic Standing - Comments: max A  Extremity/Trunk Assessment RUE Assessment RUE Assessment: Within Functional Limits LUE Assessment LUE Assessment: Within Functional Limits   See Function Navigator for Current Functional Status.   Refer to Care Plan for Long Term Goals  Recommendations for other services: None    Discharge Criteria: Patient will be discharged from OT if patient refuses treatment 3 consecutive times without medical reason, if treatment goals not met, if there is a change in medical status, if patient makes no progress towards goals or if patient is discharged from hospital.  The above assessment, treatment plan, treatment alternatives and goals were discussed and mutually agreed upon: by patient  Delon [place] 05/28/2017, 4:26 PM

## 2017-05-29 ENCOUNTER — Inpatient Hospital Stay (HOSPITAL_COMMUNITY): Payer: Medicare Other | Admitting: Speech Pathology

## 2017-05-29 ENCOUNTER — Inpatient Hospital Stay (HOSPITAL_COMMUNITY): Payer: Medicare Other | Admitting: Occupational Therapy

## 2017-05-29 ENCOUNTER — Inpatient Hospital Stay (HOSPITAL_COMMUNITY): Payer: Medicare Other

## 2017-05-29 DIAGNOSIS — R001 Bradycardia, unspecified: Secondary | ICD-10-CM

## 2017-05-29 DIAGNOSIS — N179 Acute kidney failure, unspecified: Secondary | ICD-10-CM

## 2017-05-29 DIAGNOSIS — R0989 Other specified symptoms and signs involving the circulatory and respiratory systems: Secondary | ICD-10-CM

## 2017-05-29 NOTE — Progress Notes (Signed)
Occupational Therapy Session Note  Patient Details  Name: Nicole [name] MRN: [id number] Date of Birth: 07-23-1928  Today's Date: 05/29/2017 OT Individual Time: 1005-1045 OT Individual Time Calculation (min): 40 min    Short Term Goals: Week 1:  OT Short Term Goal 1 (Week 1): Pt will complete shower transfer with min A  OT Short Term Goal 2 (Week 1): Pt will completed grooming in standing with CGA OT Short Term Goal 3 (Week 1): Pt will complete toileting task with CGA OT Short Term Goal 4 (Week 1): Pt will complete LB dressing with CGA   Skilled Therapeutic Interventions/Progress Updates:    Pt seen for OT session focusing on ADL re-training and orientation. Pt sitting up in w/c upon arrival, agreeable to tx session. She was oriented to person only upon arrival and reoriented, though displayed no carry over of education during session.  She declined bathing session this morning, though willing to change dirty shirt. She was able to don shirt with set-up assist only. She ambulated to bathroom with hand held assist requiring mod A for balance and VCs for safety as pt attempting to reach outside BOS for wall.  She completed toileting task with steadying assist requiring VCs to remember to complete hygiene. Used RW to exit bathroom, completed with min A. Completed grooming tasks standing at sink requiring assist to locate all items. She returned to w/c and spent remainder of session working on re-orientation to time place and situation. She required max cuing to orient and had 0 carry over of orientation facts once reoriented and immediatly asked again.  She was able to correclt sequence place cards of days of the week and months of the year with increased time.  Pt left seated in w/c at end of session, all needs in reach, QRB donned and telesitter activated.   Therapy Documentation Precautions:  Precautions Precautions: Fall Precaution Comments: Confusion Restrictions Weight Bearing  Restrictions: No Pain:   No/ denies pain  See Function Navigator for Current Functional Status.   Therapy/Group: Individual Therapy  Shivon Hackel L 05/29/2017, 6:48 AM

## 2017-05-29 NOTE — Progress Notes (Signed)
Physical Therapy Session Note  Patient Details  Name: Nicole [name] MRN: [id number] Date of Birth: 03/06/1929  Today's Date: 05/29/2017 PT Individual Time: 1300-1330 PT Individual Time Calculation (min): 30 min   Short Term Goals: Week 1:  PT Short Term Goal 1 (Week 1): pt will perform gait x 50' with min A in controlled environment PT Short Term Goal 2 (Week 1): pt will demo standign balance for functional task with min A  Skilled Therapeutic Interventions/Progress Updates:    Session focused on cognitive remediation while addressing dynamic standing balance and standing tolerance while performing coin sorting task. Initially silver vs pennies and progressed to types of silver coins (able to distinguish half dollars without cues but requires mod questioning cues to sort quarters/nickels/dimes. Pt with difficulty adding up total amount of coins as well and demonstrates decreased insight to having difficulty with this. Functional transfer back to bed with mod assist without AD and decreased ability to problem solve how to get into the bed. Initially pt wanting to climb in but had LOB requiring mod assist to recover and max cues to sequence to sit down on bed first and then bring legs in. Pt asking about when she can leave and upset that PT stated she would likely be here 10-14 days. Emotional support provided and pt again confused about where she was and hwy she was here - reoriented as able.   Therapy Documentation Precautions:  Precautions Precautions: Fall Precaution Comments: Confusion Restrictions Weight Bearing Restrictions: No Pain:  Denies pain.    See Function Navigator for Current Functional Status.   Therapy/Group: Individual Therapy  Nicole [name], Nicole [name]  Nicole [name], PT, DPT  05/29/2017, 2:31 PM

## 2017-05-29 NOTE — Progress Notes (Signed)
Physical Therapy Session Note  Patient Details  Name: Nicole [name] MRN: [id number] Date of Birth: May 12, 1929  Today's Date: 05/29/2017 PT Individual Time: 1115-1158 PT Individual Time Calculation (min): 43 min   Short Term Goals: Week 1:  PT Short Term Goal 1 (Week 1): pt will perform gait x 50' with min A in controlled environment PT Short Term Goal 2 (Week 1): pt will demo standign balance for functional task with min A  Skilled Therapeutic Interventions/Progress Updates:    Session focused on cognitive remediation and reorientation, functional transfers including furniture transfers and basic transfers with RW min assist (mod from recliner due to posterior lean), dynamic standing balance while performing functional activities (donning pillow case on pillow in apartment bed) with min assist and cues for safety, gait training with RW (max cues for positioning of self inside of RW for safety and min assist for balance) x 90' and occasional mod assist during turns and obstacle negotiation, and stair negotiation training for functional mobility and general strengthening and min assist. Pt only oriented to self throughout session and no insight or awareness into deficits.   Therapy Documentation Precautions:  Precautions Precautions: Fall Precaution Comments: Confusion Restrictions Weight Bearing Restrictions: No  Pain: Pain Assessment Pain Assessment: No/denies pain Pain Score: 0-No pain   See Function Navigator for Current Functional Status.   Therapy/Group: Individual Therapy  Karolee StampsGray, [name]  [name], PT, DPT  05/29/2017, 12:05 PM

## 2017-05-29 NOTE — Progress Notes (Signed)
[facility]ICAL [facility]ICINE & REHABILITATION     PROGRESS NOTE  Subjective/Complaints:  Pt seen laying in bed this AM.  She slept well overnight.  She states she feels better.   ROS: Denies CP, SOB, nausea, vomiting, diarrhea.  Objective: Vital Signs: Blood pressure (!) 178/52, pulse (!) 47, temperature 98.7 F (37.1 C), temperature source Oral, resp. rate 16, height 5\' 8"  (1.727 m), weight 61 kg (134 lb 7.7 oz), SpO2 96 %. No results found. Recent Labs    05/27/17 0441 05/28/17 0517  WBC 9.0 8.9  HGB 11.2* 11.2*  HCT 35.8* 35.0*  PLT 261 241   Recent Labs    05/27/17 0441 05/28/17 0517  NA 139 140  K 3.8 3.5  CL 109 106  GLUCOSE 120* 117*  BUN 13 8  CREATININE 0.98 0.91  CALCIUM 9.0 9.0   CBG (last 3)  No results for input(s): GLUCAP in the last 72 hours.  Wt Readings from Last 3 Encounters:  05/28/17 61 kg (134 lb 7.7 oz)  05/27/17 64.6 kg (142 lb 6.7 oz)    Physical Exam:  BP (!) 178/52 (BP Location: Left Arm)   Pulse (!) 47   Temp 98.7 F (37.1 C) (Oral)   Resp 16   Ht 5\' 8"  (1.727 m)   Wt 61 kg (134 lb 7.7 oz)   SpO2 96%   BMI 20.45 kg/m  Constitutional: She appears well-developed. Frail  HENT: Normocephalic and atraumatic.  Eyes: EOM are normal. No discharge.  Cardiovascular: Bradycardia   Respiratory: Effort normal and breath sounds normal.  GI: Bowel sounds are normal. Nondistended.. Musculoskeletal: She exhibits no edema or tenderness.  Neurological: A&Ox1 Expressive deficits She was able to follow simple commands  Motor: 4+/5 in Bilateral delt, bi, tri, grip (unchanged) LLE: 4/5 proximal to distal  RLE: 4/5 proximal to distal   Skin: Skin is warm and dry.  Psychiatric: Her affect is blunt. Her speech is delayed. She is slowed. Cognition and memory are impaired.    Assessment/Plan: 1. Functional deficits secondary to left PCA infarction which require 3+ hours per day of interdisciplinary therapy in a comprehensive inpatient rehab  setting. Physiatrist is providing close team supervision and 24 hour management of active medical problems listed below. Physiatrist and rehab team continue to assess barriers to discharge/monitor patient progress toward functional and medical goals.  Function:  Bathing Bathing position   Position: Shower  Bathing parts Body parts bathed by patient: Right arm, Left upper leg, Left arm, Chest, Right upper leg, Abdomen, Front perineal area, Buttocks, Right lower leg, Left lower leg Body parts bathed by helper: Back  Bathing assist Assist Level: Touching or steadying assistance(Pt > 75%)      Upper Body Dressing/Undressing Upper body dressing   What is the patient wearing?: Pull over shirt/dress     Pull over shirt/dress - Perfomed by patient: Thread/unthread right sleeve, Thread/unthread left sleeve, Put head through opening, Pull shirt over trunk          Upper body assist Assist Level: Supervision or verbal cues      Lower Body Dressing/Undressing Lower body dressing   What is the patient wearing?: Underwear, Pants, Non-skid slipper socks   Underwear - Performed by helper: Thread/unthread right underwear leg, Thread/unthread left underwear leg, Pull underwear up/down Pants- Performed by patient: Thread/unthread right pants leg, Thread/unthread left pants leg, Pull pants up/down Pants- Performed by helper: Pull pants up/down Non-skid slipper socks- Performed by patient: Don/doff right sock, Don/doff left sock  Lower body assist Assist for lower body dressing: Touching or steadying assistance (Pt > 75%)      Toileting Toileting          Toileting assist     Transfers Chair/bed transfer     Chair/bed transfer assist level: Moderate assist (Pt 50 - 74%/lift or lower)       Locomotion Ambulation     Max distance: 20 Assist level: Touching or steadying assistance (Pt > 75%)   Wheelchair          Cognition Comprehension Comprehension  assist level: Understands basic 90% of the time/cues < 10% of the time  Expression Expression assist level: Expresses basic 50 - 74% of the time/requires cueing 25 - 49% of the time. Needs to repeat parts of sentences.  Social Interaction Social Interaction assist level: Interacts appropriately 75 - 89% of the time - Needs redirection for appropriate language or to initiate interaction.  Problem Solving Problem solving assist level: Solves basic 50 - 74% of the time/requires cueing 25 - 49% of the time  Memory Memory assist level: Recognizes or recalls 25 - 49% of the time/requires cueing 50 - 75% of the time    Medical Problem List and Plan: 1.  Aphasia with gait and balance disorder secondary to left PCA infarction   Cont CIR 2.  DVT Prophylaxis/Anticoagulation: Subcutaneous Lovenox 3. Pain Management: Hydrocodone as needed 4. Mood/memory loss: Provide emotional support. Provide tele sitter for safety 5. Neuropsych: This patient is not capable of making decisions on her own behalf. 6. Skin/Wound Care: Routine skin checks 7. Fluids/Electrolytes/Nutrition: Routine I&O's    BMP within acceptable range on 12/27 8. Hypertension: Norvasc 2.5 mg daily, Lisinopril 40 mg daily, hydralazine 50 mg every 8 hours   Labile with hypertensive crisis yesterday.  Will monitor for trend and avoid hypoperfusion 9. Bradycardia. Attempts at placement of pacemaker 05/21/2017 unsuccessful. Plan to continue to monitor. 10. Constipation. Laxative assistance 11. Hyperlipidemia. Pravachol 12. Prediabetes   Relatively controlled on 12/27 13. Hypoalbuminemia   Supplement initiated on 12/27 14. Acute blood loss anemia   Hemoglobin 11.2 on 12/27   Continue to monitor 15. AKI    Cr 0.91 on 12/27   Cont to monitor   Encourage fluids 16. Bradycardia   Monitor for symptoms with increased physical activity   LOS (Days) 2 A FACE TO FACE EVALUATION WAS PERFORMED  Janellie Tennison Karis Jubanil Meyli Boice 05/29/2017 9:14 AM

## 2017-05-29 NOTE — Progress Notes (Signed)
Occupational Therapy Note  Patient Details  Name: Nicole [name] MRN: [id number] Date of Birth: 1929-05-12  Today's Date: 05/29/2017 OT Individual Time: 0900-0930 OT Individual Time Calculation (min): 30 min   Pt denies pain Individual therapy  Pt resting in w/c upon arrival.  Pt initially engaged in simple peg board pattern activity.  Pt required max verbal cues for replicate pattern after colors preselected.  Pt required max verbal cues for orientation to date (year), place, residence address, and year of birth.  Focus on orientation and cognitive remediation.  Pt remained in w/c with QRB in place and Telesitter.    Nicole [name], Nicole [name] [facility] Inc[facility]appell 05/29/2017, 9:44 AM

## 2017-05-29 NOTE — Plan of Care (Signed)
  Progressing Consults RH STROKE PATIENT EDUCATION Description See Patient Education module for education specifics  05/29/2017 [phone] - Progressing by Dani Gobbleeardon, Augustina Braddock J, RN RH SKIN INTEGRITY RH STG SKIN FREE OF INFECTION/BREAKDOWN Description Skin will remain free from infection/skin break down with Mod I assistance  05/29/2017 0921 - Progressing by Dani Gobbleeardon, Glema Takaki J, RN RH STG ABLE TO PERFORM INCISION/WOUND CARE W/ASSISTANCE Description STG Able To Perform [facility]ision/Wound Care [facility].  05/29/2017 [id number] - Progressing by Dani Gobbleeardon, Chandon Lazcano J, RN RH SAFETY RH STG ADHERE TO SAFETY PRECAUTIONS W/ASSISTANCE/DEVICE Description STG Adhere to Safety Precautions With Min Assistance/Device.  05/29/2017 [id number] - Progressing by Dani Gobbleeardon, Rapheal Masso J, RN RH STG DECREASED RISK OF FALL WITH ASSISTANCE Description STG Decreased Risk of Fall With [employer] Assistance.  05/29/2017 [id number] - Progressing by Dani Gobbleeardon, Torrell Krutz J, RN RH COGNITION-NURSING RH STG USES MEMORY AIDS/STRATEGIES W/ASSIST TO PROBLEM SOLVE Description STG Uses Memory Aids/Strategies With Min Assistance to Problem Solve.  05/29/2017 [id number] - Progressing by Dani Gobbleeardon, Darrielle Pflieger J, RN RH STG ANTICIPATES NEEDS/CALLS FOR ASSIST W/ASSIST/CUES Description STG Anticipates Needs/Calls for Assist With Min Assistance/Cues.  05/29/2017 [id number] - Progressing by Dani Gobbleeardon, Lovada Barwick J, RN

## 2017-05-29 NOTE — Progress Notes (Signed)
Speech Language Pathology Daily Session Note  Patient Details  Name: Nicole [name] MRN: [id number] Date of Birth: [birth year]  Today's Date: 05/29/2017 SLP Individual Time: 0800-0900 SLP Individual Time Calculation (min): 60 min  Short Term Goals: Week 1: SLP Short Term Goal 1 (Week 1): Pt will name familiar objects with min assist cues to recognize and correct verbal errors.   SLP Short Term Goal 2 (Week 1): Pt will utilize compensatory word finding strategies during structured and unstructured verbal tasks with min assist cues.   SLP Short Term Goal 3 (Week 1): Pt will utilize external aids to recall basic, daily information with mod assist verbal cues.   SLP Short Term Goal 4 (Week 1): Pt will complete basic, familiar tasks with mod assist for functional problem solving (i.e. task initiation, sequencing, organization).   SLP Short Term Goal 5 (Week 1): Pt will return demonstration of at least 2 safety precautions during functional tasks with mod assist verbal cues.    Skilled Therapeutic Interventions:  Pt was seen for skilled ST targeting cognitive-linguistic goals.  Pt was in bed eating breakfast upon therapist's arrival but was agreeable to participating in therapy.  Pt was transferred to wheelchair and she completed basic, sinkside ADLs with mod I.  Pt was able to match word to object from a field of three for 100% accuracy with supervision but needed mod assist to match descriptive phrase to object.  Pt also needed max assist to sequence 4 step picture cards but could describe actions in pictures with mod assist verbal cues.  Pt was able to return demonstration of how to apply brakes to wheelchair and use of call bell with min demonstration cues.  Pt was left in wheelchair with call bell within reach and quick release belt donned.  Telesitter still in use.  Continue per current plan of care.    Function:  Eating Eating                 Cognition Comprehension Comprehension  assist level: Understands basic 90% of the time/cues < 10% of the time  Expression   Expression assist level: Expresses basic 50 - 74% of the time/requires cueing 25 - 49% of the time. Needs to repeat parts of sentences.  Social Interaction Social Interaction assist level: Interacts appropriately 75 - 89% of the time - Needs redirection for appropriate language or to initiate interaction.  Problem Solving Problem solving assist level: Solves basic 50 - 74% of the time/requires cueing 25 - 49% of the time  Memory Memory assist level: Recognizes or recalls 25 - 49% of the time/requires cueing 50 - 75% of the time    Pain Pain Assessment Pain Assessment: No/denies pain  Therapy/Group: Individual Therapy  Tamara Monteith, Melanee SpryNicole L 05/29/2017, 8:58 AM

## 2017-05-30 ENCOUNTER — Inpatient Hospital Stay (HOSPITAL_COMMUNITY): Payer: Medicare Other | Admitting: Physical Therapy

## 2017-05-30 ENCOUNTER — Inpatient Hospital Stay (HOSPITAL_COMMUNITY): Payer: Medicare Other | Admitting: Occupational Therapy

## 2017-05-30 ENCOUNTER — Inpatient Hospital Stay (HOSPITAL_COMMUNITY): Payer: Medicare Other

## 2017-05-30 MED ORDER — AMLODIPINE BESYLATE 5 MG PO TABS
5.0000 mg | ORAL_TABLET | Freq: Every day | ORAL | Status: DC
  Administered 2017-05-30: 5 mg via ORAL
  Filled 2017-05-30: qty 1

## 2017-05-30 NOTE — Progress Notes (Signed)
Speech Language Pathology Daily Session Note  Patient Details  Name: Nicole [name] MRN: [id number] Date of Birth: Jan 24, 1929  Today's Date: 05/30/2017 SLP Individual Time: [id number] SLP Individual Time Calculation (min): 44 min  Short Term Goals: Week 1: SLP Short Term Goal 1 (Week 1): Pt will name familiar objects with min assist cues to recognize and correct verbal errors.   SLP Short Term Goal 2 (Week 1): Pt will utilize compensatory word finding strategies during structured and unstructured verbal tasks with min assist cues.   SLP Short Term Goal 3 (Week 1): Pt will utilize external aids to recall basic, daily information with mod assist verbal cues.   SLP Short Term Goal 4 (Week 1): Pt will complete basic, familiar tasks with mod assist for functional problem solving (i.e. task initiation, sequencing, organization).   SLP Short Term Goal 5 (Week 1): Pt will return demonstration of at least 2 safety precautions during functional tasks with mod assist verbal cues.    Skilled Therapeutic Interventions: Skilled ST services focused on cognitive skills. Spt was asleep upon entering room and agreeable to participate in FultsSt services, after eating breakfast. SLP positioned pt in bed and set up breakfast try, however pt refused to eat breakfast stating " I cant. I cant eat right now" SLP educated pt on the importance of maintaining nutrition in order to participate in therapy services and reviewed today's therapy schedule, rewrting schedule in large font so pt could read it. SLP facilitated orientation to place and time with visual aid for recall and spaced retrieval tasks, Pt required max verbal/visual cues to recall/use visual aid after 5 minute delay with mild distractions, however increased recall ability over several trials up 3 minutes with mod verbal/visual cues and in non-distracting environment. Pt required max verbal cues to encourage participation in therapy stating " I cant. I did not sleep  and I am not awake." Pt was left in bed with call bell within reach and alarms set. Recommend to continue skilled St services.      Function:  Eating Eating                 Cognition Comprehension Comprehension assist level: Understands basic 75 - 89% of the time/ requires cueing 10 - 24% of the time  Expression   Expression assist level: Expresses basic 50 - 74% of the time/requires cueing 25 - 49% of the time. Needs to repeat parts of sentences.  Social Interaction Social Interaction assist level: Interacts appropriately 75 - 89% of the time - Needs redirection for appropriate language or to initiate interaction.  Problem Solving Problem solving assist level: Solves basic 50 - 74% of the time/requires cueing 25 - 49% of the time  Memory Memory assist level: Recognizes or recalls 25 - 49% of the time/requires cueing 50 - 75% of the time    Pain Pain Assessment Pain Assessment: No/denies pain  Therapy/Group: Individual Therapy  Shontez Sermon  [facility] 05/30/2017, 8:45 AM

## 2017-05-30 NOTE — Progress Notes (Signed)
Physical Therapy Session Note  Patient Details  Name: Zylpha Poynor MRN: [id number] Date of Birth: 1928-12-17  Today's Date: 05/30/2017 PT Individual Time: 1045-1200 PT Individual Time Calculation (min): 75 min  Today's Date: 05/30/2017  Short Term Goals: Week 1:  PT Short Term Goal 1 (Week 1): pt will perform gait x 50' with min A in controlled environment PT Short Term Goal 2 (Week 1): pt will demo standign balance for functional task with min A  Skilled Therapeutic Interventions/Progress Updates:    no verbal c/o pain.  Frequently holding her head throughout session, but denies HA pain.  Session focus on cognitive remediation.    Pt taken to quiet therapy gym for mildly distracting environment.  Pt complete 3/4 of a puzzle with max cues.  Pt requires only supervision cues for basic color sorting task into field of 2 and field of 5.  PT instructed pt in novel card game and pt completed a full game with mod cues to remember instructions and goal of the game.  Attempted to have pt sort food bean bags into meals, but pt unable to do so despite max cues.    Provided pt with external memory aids for orientation to place and situation on return to room.  Pt left upright in w/c with QRB in place, call bell in reach and needs met.   Therapy Documentation Precautions:  Precautions Precautions: Fall Precaution Comments: Confusion Restrictions Weight Bearing Restrictions: No   See Function Navigator for Current Functional Status.   Therapy/Group: Individual Therapy  Michel Santee 05/30/2017, 12:28 PM

## 2017-05-30 NOTE — Progress Notes (Signed)
Physical Therapy Session Note  Patient Details  Name: Nicole [name] MRN: [id number] Date of Birth: 08/09/1928  Today's Date: 05/30/2017 PT Individual Time: 1510-1525 PT Individual Time Calculation (min): 15 min  and Today's Date: 05/30/2017 PT Missed Time: 30 Minutes Missed Time Reason: Patient unwilling to participate  Short Term Goals: Week 1:  PT Short Term Goal 1 (Week 1): pt will perform gait x 50' with min A in controlled environment PT Short Term Goal 2 (Week 1): pt will demo standign balance for functional task with min A  Skilled Therapeutic Interventions/Progress Updates:   Pt supine upon arrival and attempting to get OOB w/o assistance. Pt asking to go to bathroom. Ambulated to/from bathroom w/ min assist using RW, ~20' each way. Pt had continent BM and required mod verbal cues to perform pericare. She performed multiple sit<>stands using rails in bathroom w/ close supervision while PT provided max assist for LE garment management. Ambulated to sink to wash hands and returned to EOB. Pt refusing to participate in therapy outside of room and quickly returned to supine w/ supervision. She repeatedly stated that she was having an "off" day and didn't know what was wrong with her. Ended session in supine, call bell within reach and all needs met.   Therapy Documentation Precautions:  Precautions Precautions: Fall Precaution Comments: Confusion Restrictions Weight Bearing Restrictions: No General: PT Amount of Missed Time (min): 30 Minutes PT Missed Treatment Reason: Patient unwilling to participate Vital Signs: Therapy Vitals Temp: 98.9 F (37.2 C) Temp Source: Oral Pulse Rate: (!) 48 Resp: 18 BP: (!) 182/43 Patient Position (if appropriate): Lying Oxygen Therapy SpO2: 94 % O2 Device: Not Delivered  See Function Navigator for Current Functional Status.   Therapy/Group: Individual Therapy  Nishat Livingston K Arnette 05/30/2017, 4:15 PM

## 2017-05-30 NOTE — Progress Notes (Signed)
Nicole [name] PHYSICAL MEDICINE & REHABILITATION     PROGRESS NOTE  Subjective/Complaints:  Patient lying in bed this morning.  She states she feels well.  She slept well.  She has no complaints.  Objective: Vital Signs: Blood pressure (!) 184/49, pulse (!) 52, temperature 97.6 F (36.4 C), temperature source Oral, resp. rate 16, height 5\' 8"  (1.727 m), weight 139 lb 12.4 oz (63.4 kg), SpO2 94 %.  Early female in no acute distress. HEENT exam: Atraumatic, normocephalic Neck is supple Chest is clear to auscultation without any increased work of breathing. Cardiac exam S1 and S2 are regular and bradycardic.  Apical pulse is 51. Extremities without edema Abdominal exam thin, active bowel sounds, soft.  Assessment/Plan: 1. Functional deficits secondary to left PCA infarction    Medical Problem List and Plan: 1.    A fascia with gait and balance disorder secondary to left posterior cerebral artery infarct. 2.  DVT Prophylaxis/Anticoagulation: Subcutaneous Lovenox 3. Pain Management: Hydrocodone as needed 4. Mood/memory loss:  5. Neuropsych: This patient is not capable of making decisions on her own behalf. 6. Skin/Wound Care: Routine skin checks 7. Fluids/Electrolytes/Nutrition: Routine I&O's     Basic Metabolic Panel:    Component Value Date/Time   NA 140 05/28/2017 0517   K 3.5 05/28/2017 0517   CL 106 05/28/2017 0517   CO2 27 05/28/2017 0517   BUN 8 05/28/2017 0517   CREATININE 0.91 05/28/2017 0517   GLUCOSE 117 (H) 05/28/2017 0517   CALCIUM 9.0 05/28/2017 0517    8. Hypertension: 165/36-190/40 Norvasc 2.5 mg daily, Lisinopril 40 mg daily, hydralazine 50 mg every 8 hours   Labile .  Will increase Norvasc.  We will continue to monitor.  Need to avoid hypoperfusion. 9. Bradycardia. Attempts at placement of pacemaker 05/21/2017 unsuccessful. Plan to continue to monitor. 10. Constipation. Laxative assistance 11. Hyperlipidemia. Pravachol 12. Prediabetes   CBG (last 3)  Will  follow with electrolytes for the time being.  13. Hypoalbuminemia   Supplement initiated on 12/27 14. Acute blood loss anemia    Lab Results  Component Value Date   HGB 11.2 (L) 05/28/2017      Continue to monitor 15. AKI    Cr 0.91 on 12/27   Estimated gfr 7456ml/min 16. Bradycardia   Asymptomatic.  We will continue to follow.   LOS (Days) 3 A FACE TO FACE EVALUATION WAS PERFORMED  Bruce H Swords 05/30/2017 8:50 AM

## 2017-05-30 NOTE — Progress Notes (Signed)
[facility]patient [facility] Individual Statement of Services  Patient Name:  Nicole [name]  Date:  05/30/2017  Welcome to the [facility]patient [facility].  Our goal is to provide you with an individualized program based on your diagnosis and situation, designed to meet your specific needs.  With this comprehensive rehabilitation program, you will be expected to participate in at least 3 hours of rehabilitation therapies Monday-Friday, with modified therapy programming on the weekends.  Your rehabilitation program will include the following services:  Physical Therapy (PT), Occupational Therapy (OT), Speech Therapy (ST), 24 hour per day rehabilitation nursing, Neuropsychology, Case Management (Social Worker), Rehabilitation Medicine, Nutrition Services and Pharmacy Services  Weekly team conferences will be held on Wednesdays to discuss your progress.  Your Social Worker will talk with you frequently to get your input and to update you on team discussions.  Team conferences with you and your family in attendance may also be held.  Expected length of stay: 10 to 14 days  Overall anticipated outcome:  Supervision  Depending on your progress and recovery, your program may change. Your Social Worker will coordinate services and will keep you informed of any changes. Your Social Worker's name and contact numbers are listed  below.  The following services may also be recommended but are not provided by the Inpatient Rehabilitation Center:   Driving Evaluations  Home Health Rehabiltiation Services  Outpatient Rehabilitation Services   Arrangements will be made to provide these services after discharge if needed.  Arrangements include referral to agencies that provide these services.  Your insurance has been verified to be:  Medicare and [employer] Blue Shield Your [facility]  [facility] [address] [place] (was Dr. Willa RoughHicks, but she is  no longer there and you were about to re-establish care with new provider when you ended up in the hospital)  Pertinent information will be shared with your doctor and your insurance company.  Social Worker:  Staci AcostaJenny Makenna Macaluso, LCSW  [phone] or [phone]  Information discussed with and copy given to patient by: Elvera LennoxPrevatt, Ashaz Robling Capps, 05/30/2017, 12:27 AM

## 2017-05-30 NOTE — Progress Notes (Signed)
Occupational Therapy Session Note  Patient Details  Name: Nicole Birkrene [name] MRN: [id number] Date of Birth: 1928-10-08  Today's Date: 05/30/2017 OT Individual Time: [id number] OT Individual Time Calculation (min): 27 min    Short Term Goals: Week 1:  OT Short Term Goal 1 (Week 1): Pt will complete shower transfer with min A  OT Short Term Goal 2 (Week 1): Pt will completed grooming in standing with CGA OT Short Term Goal 3 (Week 1): Pt will complete toileting task with CGA OT Short Term Goal 4 (Week 1): Pt will complete LB dressing with CGA   Skilled Therapeutic Interventions/Progress Updates:    Upon entering the room, pt supine in bed with c/o " I don't feel well." RN reported pt recently having large BM and returning to bed prior to OT arrival. Pt refused OOB activities. Pt oriented with max multimodal cues this session. Pt shows limited insight to deficits and pt states, " I am alright...everything is normal." OT continues to educated pt on OT purpose, POC, and goals with current functional deficits. Pt remaining in bed at end of session with call bell and all needed items within reach. Bed alarm activated.   Therapy Documentation Precautions:  Precautions Precautions: Fall Precaution Comments: Confusion Restrictions Weight Bearing Restrictions: No General:   Vital Signs: Therapy Vitals Temp: 98.9 F (37.2 C) Temp Source: Oral Pulse Rate: (!) 48 Resp: 18 BP: (!) 182/43 Patient Position (if appropriate): Lying Oxygen Therapy SpO2: 94 % O2 Device: Not Delivered Pain: Pain Assessment Pain Assessment: No/denies pain ADL: ADL Eating: Set up Where Assessed-Eating: Wheelchair Grooming: Minimal assistance Where Assessed-Grooming: Standing at sink Upper Body Bathing: Minimal assistance Where Assessed-Upper Body Bathing: Shower Lower Body Bathing: Minimal assistance Where Assessed-Lower Body Bathing: Shower Upper Body Dressing: Setup Where Assessed-Upper Body Dressing:  Wheelchair Lower Body Dressing: Moderate assistance Where Assessed-Lower Body Dressing: Wheelchair Toileting: Minimal assistance Where Assessed-Toileting: Teacher, adult educationToilet Toilet Transfer: Moderate assistance Toilet Transfer Method: Stand pivot Toilet Transfer Equipment: AcupuncturistGrab bars Walk-In Shower Transfer: Moderate assistance Film/video editorWalk-In Shower Transfer Method: Warden/rangertand pivot Walk-In Shower Equipment: Shower seat with back, Grab bars(from w/c) ADL Comments: cues for thoroughness Vision   Perception    Praxis   Exercises:   Other Treatments:    See Function Navigator for Current Functional Status.   Therapy/Group: Individual Therapy  Alen BleacherBradsher, Tanashia Ciesla P 05/30/2017, 2:29 PM

## 2017-05-30 NOTE — Progress Notes (Signed)
Social Work Assessment and Plan  Patient Details  Name: Nicole [name] MRN: [id number] Date of Birth: February 10, 1929  Today's Date: 05/29/2017  Problem List:  Patient Active Problem List   Diagnosis Date Noted  . AKI (acute kidney injury) ([place])   . Labile blood pressure   . Bradycardia   . Hypertensive crisis   . Essential hypertension   . Prediabetes   . Hypoalbuminemia due to protein-calorie malnutrition ([place])   . Acute blood loss anemia   . Lacunar infarction 05/27/2017  . Hyperlipidemia   . Slow transit constipation   . Cerebral thrombosis with cerebral infarction 05/22/2017  . Pacemaker   . Heart block 05/21/2017  . Memory deficit 05/21/2017  . Acute encephalopathy 05/21/2017  . Mobitz type 2 second degree heart block   . Hypertensive urgency 05/20/2017   Past Medical History:  Past Medical History:  Diagnosis Date  . Hypertension   . Polio    Past Surgical History:  Past Surgical History:  Procedure Laterality Date  . ABDOMINAL HYSTERECTOMY    . CHOLECYSTECTOMY    . PACEMAKER IMPLANT N/A 05/21/2017   Procedure: PACEMAKER IMPLANT;  Surgeon: Evans Lance, MD;  Location: [place] [facility];  Service: Cardiovascular;  Laterality: N/A;   Social History:  reports that  has never smoked. she has never used smokeless tobacco. Her alcohol and drug histories are not on file.  Family / Support Systems Marital Status: Widow/Widower How Long?:  6 years Patient Roles: Parent Children: Nicole [name] - son - [phone]; Nicole [name] - son Other Supports: dtr-in-law Anticipated Caregiver: Son and dtr-in-law; paid caregivers Ability/Limitations of Caregiver: Family stay on the farm all day.  Has a caregiver 10 am to 1 pm 5 days a week. Caregiver Availability: 24/7 Family Dynamics: Family is very supportive and will make sure that pt has 24/7 supervision.  Social History Preferred language: [place] Religion:  Cultural Background: Pt moved to the [place] in 1968 from  [place], [place]. Read: Yes Write: Yes Employment Status: Retired Public relations account executive Issues: none reported Guardian/Conservator: Pt's son, Nicole [name], has healthcare POA for pt.   Abuse/Neglect Abuse/Neglect Assessment Can Be Completed: Yes Physical Abuse: Denies Verbal Abuse: Denies Sexual Abuse: Denies Exploitation of patient/patient's resources: Denies Self-Neglect: Denies  Emotional Status Pt's affect, behavior and adjustment status: Pt was confused at times during CSW visit as to why she was in the hospital and at other times seemed to understand CSW's questions, but could not find the right words to answer. This did not appear to upset the pt. Recent Psychosocial Issues: Pt's memory has been declining for the last 6 months or so and son only recently learned that pt was not taking her medications as prescribed which may have led to this stroke. Psychiatric History: none reported Substance Abuse History: none reported  Patient / Family Perceptions, Expectations & Goals Pt/Family understanding of illness & functional limitations: Pt's son has a good understanding of pt's condition and her needs at d/c. Premorbid pt/family roles/activities: Pt was not doing as much at home.  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 Anticipated changes in roles/activities/participation: Son hopes she can resume life with them on the farm. Pt/family expectations/goals: Pt's son would like to have pt home when she is d/c'd and has already started making preparations for her.    Community Resources Express Scripts: None Premorbid Home Care/DME Agencies: ([facility] for paid caregivers) Transportation available at discharge: family Resource referrals recommended: Neuropsychology, Support  group (specify)(stroke support group)  Discharge Planning Living Arrangements: Children Support Systems: Children Type of Residence: Private residence Insurance  Resources: [employer], Multimedia programmer (specify)([employer]) Financial Resources: Radio broadcast assistant Screen Referred: No Living Expenses: Lives with family Money Management: Family(son and dtr-in-law recently took this over for pt as they noticed she was late on bills) Does the patient have any problems obtaining your medications?: No Home Management: Pt's son and dtr-in-law can take care of this and were already doing so in large part. Patient/Family Preliminary Plans: Pt to return home with son and dtr-in-law and paid caregivers to provide 24/7 supervision. Expected length of stay: 10-14 days  Clinical Impression CSW met with pt and then spoke with son, Nicole [name], via telephone with pt's permission, to introduce self and role of CSW, as well as to complete assessment.  Pt was pleasant with CSW, but had memory/cognitive deficits in addition to word finding difficulty.  Pt's son is very supportive and is looking forward to having his mother return home.  Prior to this happening, he was already preparing the home so that she could age there - tub with door and seat inside, pulling up rugs and moving furniture, and arranging 24/7 care.  Pt has a RW and rollator at home.  Son to bring RW in for PT to assess.  Pt has lifeline like device already and had an aide 3 hours/day 5 days/week.  CSW explained she will need 24/7 supervision now and son was already planning on this and had contacted [facility] just in case.  CSW to give them d/c date ASAP next week after conference so that they can begin working on this.  CSW will continue to follow and assist as needed.   Nicole [name], Silvestre Mesi 05/30/2017, 12:45 AM

## 2017-05-31 ENCOUNTER — Inpatient Hospital Stay (HOSPITAL_COMMUNITY): Payer: Medicare Other

## 2017-05-31 MED ORDER — AMLODIPINE BESYLATE 10 MG PO TABS
10.0000 mg | ORAL_TABLET | Freq: Every day | ORAL | Status: DC
  Administered 2017-05-31 – 2017-06-10 (×11): 10 mg via ORAL
  Filled 2017-05-31 (×12): qty 1

## 2017-05-31 NOTE — Progress Notes (Signed)
[place] PHYSICAL MEDICINE & REHABILITATION     PROGRESS NOTE  Subjective/Complaints:  Patient lying in bed this morning.  She denies complaints.  I reviewed therapy and social work notes yesterday.  Recommendation for 24-hour care noted.  Objective: Vital Signs: Blood pressure (!) 197/46, pulse (!) 53, temperature 98.4 F (36.9 C), temperature source Oral, resp. rate 18, height 5\' 8"  (1.727 m), weight 138 lb 10.7 oz (62.9 kg), SpO2 93 %.  Thin female in no acute distress. HEENT exam atraumatic, normocephalic Neck is supple Chest is clear to auscultation without any increased work of breathing Cardiac exam S1 and S2 are regular but bradycardic.  Apical pulse is 50 Extremities without edema Abdominal exam thin, active bowel sounds, soft.  Assessment/Plan: 1. Functional deficits secondary to left PCA infarction    Medical Problem List and Plan: 1.    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 2.  DVT Prophylaxis/Anticoagulation: Subcutaneous Lovenox 3. Pain Management: Hydrocodone as needed 4. Mood/memory loss:  5. Neuropsych: This patient is not capable of making decisions on her own behalf. 6. Skin/Wound Care: Routine skin checks 7. Fluids/Electrolytes/Nutrition: Routine I&O's     Basic Metabolic Panel:    Component Value Date/Time   NA 140 05/28/2017 0517   K 3.5 05/28/2017 0517   CL 106 05/28/2017 0517   CO2 27 05/28/2017 0517   BUN 8 05/28/2017 0517   CREATININE 0.91 05/28/2017 0517   GLUCOSE 117 (H) 05/28/2017 0517   CALCIUM 9.0 05/28/2017 0517    8. Hypertension: Blood pressure remains elevated.  Will increase medications.  Will try not to over treat for concern of hypoperfusion. 9. Bradycardia. Attempts at placement of pacemaker 05/21/2017 unsuccessful. Plan to continue to monitor. 10. Constipation. Laxative assistance 11. Hyperlipidemia. Pravachol 12. Prediabetes   CBG (last 3)  Will follow with electrolytes  for the time being.  13. Hypoalbuminemia   Supplement initiated on 12/27 14. Acute blood loss anemia    Lab Results  Component Value Date   HGB 11.2 (L) 05/28/2017      Continue to monitor 15. AKI    Cr 0.91 on 12/27   Estimated gfr 5456ml/min 16. Bradycardia   Asymptomatic.  We will continue to follow.   LOS (Days) 4 A FACE TO FACE EVALUATION WAS PERFORMED  Menachem Urbanek H Antonia Culbertson 05/31/2017 7:00 AM

## 2017-05-31 NOTE — Progress Notes (Signed)
Physical Therapy Session Note  Patient Details  Name: Nicole [name] MRN: [id number] Date of Birth: March 04, 1929  Today's Date: 05/31/2017 PT Individual Time: [id number] PT Individual Time Calculation (min): 42 min   Short Term Goals: Week 1:  PT Short Term Goal 1 (Week 1): pt will perform gait x 50' with min A in controlled environment PT Short Term Goal 2 (Week 1): pt will demo standign balance for functional task with min A  Skilled Therapeutic Interventions/Progress Updates:    Pt supine in bed upon PT arrival, agreeable to therapy tx and denies pain. Pt reported needing to use the bathroom. Pt transferred from supine>sitting EOB with min assist and verbal cues for safety. Pt ambulated from bed>bathroom x 15 ft with mod assist using RW, verbal cues for RW management, safety and increased step length. Pt performed all toileting with mod assist to maintain standing balance while therapist donned/doffed briefs. Pt ambulated from bathroom>w/c with mod assist using RW. Pt transported to gym in w/c. Pt worked on dynamic standing balance in order to participate in horseshoe toss, no UE support and reaching outside BOS to retrieve horseshoes, min-mod assist to maintain balance, x 2 trials. Pt ambulated x 80 ft with RW, min assist overall fading to mod assist when turning to sit. Pt left seated in w/c at end of session with QRB in place.   Therapy Documentation Precautions:  Precautions Precautions: Fall Precaution Comments: Confusion Restrictions Weight Bearing Restrictions: No   See Function Navigator for Current Functional Status.   Therapy/Group: Individual Therapy  Cresenciano GenreEmily van Schagen, PT, DPT 05/31/2017, 12:50 PM

## 2017-06-01 ENCOUNTER — Inpatient Hospital Stay (HOSPITAL_COMMUNITY): Payer: Medicare Other

## 2017-06-01 ENCOUNTER — Inpatient Hospital Stay (HOSPITAL_COMMUNITY): Payer: Medicare Other | Admitting: Occupational Therapy

## 2017-06-01 DIAGNOSIS — I160 Hypertensive urgency: Secondary | ICD-10-CM

## 2017-06-01 MED ORDER — HYDRALAZINE HCL 50 MG PO TABS: 75.0000 mg | ORAL_TABLET | Freq: Three times a day (TID) | ORAL | Status: DC

## 2017-06-01 MED ORDER — MIRTAZAPINE 15 MG PO TABS
7.5000 mg | ORAL_TABLET | Freq: Every day | ORAL | Status: DC
  Administered 2017-06-02 – 2017-06-03 (×2): 7.5 mg via ORAL
  Filled 2017-06-01 (×3): qty 1

## 2017-06-01 MED ORDER — FLUOXETINE HCL 10 MG PO CAPS
10.0000 mg | ORAL_CAPSULE | Freq: Every day | ORAL | Status: DC
  Administered 2017-06-01 – 2017-06-10 (×10): 10 mg via ORAL
  Filled 2017-06-01 (×11): qty 1

## 2017-06-01 MED ORDER — HYDRALAZINE HCL 50 MG PO TABS
75.0000 mg | ORAL_TABLET | Freq: Three times a day (TID) | ORAL | Status: DC
  Administered 2017-06-01 – 2017-06-09 (×18): 75 mg via ORAL
  Filled 2017-06-01 (×25): qty 1

## 2017-06-01 NOTE — Progress Notes (Addendum)
[place] PHYSICAL MEDICINE & REHABILITATION     PROGRESS NOTE  Subjective/Complaints:  Pt seen lying in bed this AM.  She did not sleep well overnight, but does not want any medications.  She states she would like to be alone this AM.    ROS: Denies CP, SOB, nausea, vomiting, diarrhea.  Objective: Vital Signs: Blood pressure (!) 200/44, pulse (!) 50, temperature 98.4 F (36.9 C), resp. rate 14, height 5\' 8"  (1.727 m), weight 62.9 kg (138 lb 10.7 oz), SpO2 95 %. No results found. No results for input(s): WBC, HGB, HCT, PLT in the last 72 hours. No results for input(s): NA, K, CL, GLUCOSE, BUN, CREATININE, CALCIUM in the last 72 hours.  Invalid input(s): CO CBG (last 3)  No results for input(s): GLUCAP in the last 72 hours.  Wt Readings from Last 3 Encounters:  06/01/17 62.9 kg (138 lb 10.7 oz)  05/27/17 64.6 kg (142 lb 6.7 oz)    Physical Exam:  BP (!) 200/44 (BP Location: Right Arm)   Pulse (!) 50   Temp 98.4 F (36.9 C)   Resp 14   Ht 5\' 8"  (1.727 m)   Wt 62.9 kg (138 lb 10.7 oz)   SpO2 95%   BMI 21.08 kg/m  Constitutional: She appears well-developed. Frail  HENT: Normocephalic and atraumatic.  Eyes: EOM are normal. No discharge.  Cardiovascular: Bradycardia  Respiratory: Effort normal and breath sounds normal.  GI: Bowel sounds are normal. Nondistended.. Musculoskeletal: She exhibits no edema or tenderness.  Neurological: A&Ox1 Expressive deficits She was able to follow simple commands  Motor: 4+/5 in Bilateral delt, bi, tri, grip (stable) LLE: 4/5 proximal to distal  RLE: 4/5 proximal to distal   Skin: Skin is warm and dry.  Psychiatric: Her affect is blunt. Her speech is delayed. She is slowed. Cognition and memory are impaired.    Assessment/Plan: 1. Functional deficits secondary to left PCA infarction which require 3+ hours per day of interdisciplinary therapy in a comprehensive inpatient rehab setting. Physiatrist is providing close team supervision  and 24 hour management of active medical problems listed below. Physiatrist and rehab team continue to assess barriers to discharge/monitor patient progress toward functional and medical goals.  Function:  Bathing Bathing position   Position: Shower  Bathing parts Body parts bathed by patient: Right arm, Left upper leg, Left arm, Chest, Right upper leg, Abdomen, Front perineal area, Buttocks, Right lower leg, Left lower leg Body parts bathed by helper: Back  Bathing assist Assist Level: Touching or steadying assistance(Pt > 75%)      Upper Body Dressing/Undressing Upper body dressing   What is the patient wearing?: Pull over shirt/dress     Pull over shirt/dress - Perfomed by patient: Thread/unthread right sleeve, Thread/unthread left sleeve, Put head through opening, Pull shirt over trunk Pull over shirt/dress - Perfomed by helper: Thread/unthread right sleeve, Thread/unthread left sleeve, Put head through opening        Upper body assist Assist Level: Supervision or verbal cues      Lower Body Dressing/Undressing Lower body dressing   What is the patient wearing?: Pants   Underwear - Performed by helper: Thread/unthread right underwear leg, Thread/unthread left underwear leg, Pull underwear up/down Pants- Performed by patient: Thread/unthread right pants leg, Thread/unthread left pants leg, Pull pants up/down Pants- Performed by helper: Pull pants up/down Non-skid slipper socks- Performed by patient: Don/doff right sock, Don/doff left sock  Lower body assist Assist for lower body dressing: Touching or steadying assistance (Pt > 75%)      Toileting Toileting   Toileting steps completed by patient: Adjust clothing prior to toileting Toileting steps completed by helper: Performs perineal hygiene, Adjust clothing after toileting Toileting Assistive Devices: Grab bar or rail  Toileting assist Assist level: Touching or steadying assistance (Pt.75%),  Supervision or verbal cues   Transfers Chair/bed transfer   Chair/bed transfer method: Ambulatory Chair/bed transfer assist level: Touching or steadying assistance (Pt > 75%) Chair/bed transfer assistive device: Armrests, Patent attorneyWalker     Locomotion Ambulation     Max distance: 80 ft Assist level: Moderate assist (Pt 50 - 74%)   Wheelchair          Cognition Comprehension Comprehension assist level: Understands basic 75 - 89% of the time/ requires cueing 10 - 24% of the time  Expression Expression assist level: Expresses basic 50 - 74% of the time/requires cueing 25 - 49% of the time. Needs to repeat parts of sentences.  Social Interaction Social Interaction assist level: Interacts appropriately 75 - 89% of the time - Needs redirection for appropriate language or to initiate interaction.  Problem Solving Problem solving assist level: Solves basic 50 - 74% of the time/requires cueing 25 - 49% of the time  Memory Memory assist level: Recognizes or recalls 25 - 49% of the time/requires cueing 50 - 75% of the time    Medical Problem List and Plan: 1.  Aphasia with gait and balance disorder secondary to left PCA infarction   Cont CIR 2.  DVT Prophylaxis/Anticoagulation: Subcutaneous Lovenox 3. Pain Management: Hydrocodone as needed 4. Mood/memory loss: Provide emotional support. Provide tele sitter for safety   Fluoxetine started on 12/31 5. Neuropsych: This patient is not capable of making decisions on her own behalf. 6. Skin/Wound Care: Routine skin checks 7. Fluids/Electrolytes/Nutrition: Routine I&O's    BMP within acceptable range on 12/27   Remeron 7.5 started on 12/31 8. Hypertension: Lisinopril 40 mg daily   Hydralazine 50 mg every 8 hours, increased to 75 on 13/31   Norvasc increased to 10 mg on 12/30    Hypertensive urgency this AM  9. Bradycardia. Attempts at placement of pacemaker 05/21/2017 unsuccessful. Plan to continue to monitor. 10. Constipation. Laxative  assistance 11. Hyperlipidemia. Pravachol 12. Prediabetes   Relatively controlled on 12/27 13. Hypoalbuminemia   Supplement initiated on 12/27 14. Acute blood loss anemia   Hemoglobin 11.2 on 12/27   Continue to monitor 15. AKI    Cr 0.91 on 12/27   Cont to monitor   Encourage fluids 16. Bradycardia   Monitor for symptoms with increased physical activity   LOS (Days) 5 A FACE TO FACE EVALUATION WAS PERFORMED  Brandon Scarbrough Karis Jubanil Chlora Mcbain 06/01/2017 9:14 AM

## 2017-06-01 NOTE — Progress Notes (Signed)
Occupational Therapy Session Note  Patient Details  Name: Nicole [name] MRN: [id number] Date of Birth: June 15, 1928  Today's Date: 06/01/2017 OT Individual Time: 1100-1130 OT Individual Time Calculation (min): 30 min    Short Term Goals: Week 1:  OT Short Term Goal 1 (Week 1): Pt will complete shower transfer with min A  OT Short Term Goal 2 (Week 1): Pt will completed grooming in standing with CGA OT Short Term Goal 3 (Week 1): Pt will complete toileting task with CGA OT Short Term Goal 4 (Week 1): Pt will complete LB dressing with CGA   Skilled Therapeutic Interventions/Progress Updates:    Pt received sitting up in w/c, no c/o pain though reports she is tired. Pt oriented to self and location this session. Pt washes face at sink, with encouragement in standing. Pt completes sit<>stand with ModA, Min steadying assist provided in standing while Pt completes task with min verbal cues for locating hot water. Pt transported via w/c to therapy apartment where Pt engages in seated cognitive remediation activity, sorting and placing cards in ascending order. Pt requires increased time and max verbal cues throughout for attending to task and properly sorting cards, with increased difficulty sorting face cards. Pt returned to room in manner described above end of session where Pt was left seated in w/c, QRB donned, call bell and needs within reach.   Therapy Documentation Precautions:  Precautions Precautions: Fall Precaution Comments: Confusion Restrictions Weight Bearing Restrictions: No   Pain: Pain Assessment Pain Assessment: No/denies pain Pain Score: 0-No pain Faces Pain Scale: No hurt   See Function Navigator for Current Functional Status.   Therapy/Group: Individual Therapy  Orlando PennerBreanna L Joleah Kosak 06/01/2017, 12:33 PM

## 2017-06-01 NOTE — Progress Notes (Signed)
Speech Language Pathology Daily Session Note  Patient Details  Name: Nicole [name] MRN: [id number] Date of Birth: [birth year]  Today's Date: 06/01/2017 SLP Individual Time: 0930-1000 SLP Individual Time Calculation (min): 30 min  Short Term Goals: Week 1: SLP Short Term Goal 1 (Week 1): Pt will name familiar objects with min assist cues to recognize and correct verbal errors.   SLP Short Term Goal 2 (Week 1): Pt will utilize compensatory word finding strategies during structured and unstructured verbal tasks with min assist cues.   SLP Short Term Goal 3 (Week 1): Pt will utilize external aids to recall basic, daily information with mod assist verbal cues.   SLP Short Term Goal 4 (Week 1): Pt will complete basic, familiar tasks with mod assist for functional problem solving (i.e. task initiation, sequencing, organization).   SLP Short Term Goal 5 (Week 1): Pt will return demonstration of at least 2 safety precautions during functional tasks with mod assist verbal cues.    Skilled Therapeutic Interventions: Skilled ST services focused on cognitive skills. SLP facilitated orientation to time and place requiring visual aid to assist in recall and max- mod A verbal cues to use visual aid. SLP facilitated functional problem solving sequencing 2 step card required mod-min A verbal cues, however 3 step required max a verbal cues to assist in organization and sustain attention during tasks.Pt demonstrated minimal verbal output and stated " I don't know" when asked to expand response. Pt was left in room with call bell within reach.Reccomend to continue skilled ST services.     Function:  Eating Eating                 Cognition Comprehension Comprehension assist level: Understands basic 75 - 89% of the time/ requires cueing 10 - 24% of the time  Expression   Expression assist level: Expresses basic 25 - 49% of the time/requires cueing 50 - 75% of the time. Uses single words/gestures.   Social Interaction Social Interaction assist level: Interacts appropriately 75 - 89% of the time - Needs redirection for appropriate language or to initiate interaction.  Problem Solving Problem solving assist level: Solves basic 25 - 49% of the time - needs direction more than half the time to initiate, plan or complete simple activities  Memory Memory assist level: Recognizes or recalls less than 25% of the time/requires cueing greater than 75% of the time    Pain Pain Assessment Pain Assessment: No/denies pain Pain Score: 0-No pain Faces Pain Scale: No hurt  Therapy/Group: Individual Therapy  Nicole [name]  [facility] HospitalCRATCH 06/01/2017, 11:57 AM

## 2017-06-01 NOTE — Plan of Care (Signed)
  Progressing Consults RH STROKE PATIENT EDUCATION Description See Patient Education module for education specifics  06/01/2017 1004 - Progressing by Dani Gobbleeardon, Tyon Cerasoli J, RN RH SKIN INTEGRITY RH STG SKIN FREE OF INFECTION/BREAKDOWN Description Skin will remain free from infection/skin break down with Mod I assistance  06/01/2017 1004 - Progressing by Dani Gobbleeardon, Aracelli Woloszyn J, RN RH SAFETY RH STG ADHERE TO SAFETY PRECAUTIONS W/ASSISTANCE/DEVICE Description STG Adhere to Safety Precautions With Min Assistance/Device.  06/01/2017 1004 - Progressing by Dani Gobbleeardon, Zyairah Wacha J, RN RH STG DECREASED RISK OF FALL WITH ASSISTANCE Description STG Decreased Risk of Fall With BJ'sMin Assistance.  06/01/2017 1004 - Progressing by Dani Gobbleeardon, Philander Ake J, RN RH COGNITION-NURSING RH STG USES MEMORY AIDS/STRATEGIES W/ASSIST TO PROBLEM SOLVE Description STG Uses Memory Aids/Strategies With Min Assistance to Problem Solve.  06/01/2017 1004 - Progressing by Dani Gobbleeardon, Nafeesa Dils J, RN RH STG ANTICIPATES NEEDS/CALLS FOR ASSIST W/ASSIST/CUES Description STG Anticipates Needs/Calls for Assist With Min Assistance/Cues.  06/01/2017 1004 - Progressing by Dani Gobbleeardon, Natasha Paulson J, RN

## 2017-06-01 NOTE — Progress Notes (Addendum)
Physical Therapy Note  Patient Details  Name: Nicole [name] MRN: [id number] Date of Birth: December 13, 1928 Today's Date: 06/01/2017  tx 1: 0830-0930, 60 min individual tx Pain: none per pt  Pt asleep and reluctant to wake up.  With urging, she ate a few bites of breakfast sitting up in bed.  Pt unaware of year, season, circumstances; she was aware of location. Bed mobility with supervision with HOB raised.  Pt sat EOB with intermittent LOB backwards requiring mod cues and mod assist to correct.  Pt donned pants and shirt with assistance. Sit> stand x 2 with strong unsafe posterior lean.   Seated therapeutic activity with wedge under hips to decrease posterior pelvic tilt and posterior lean, during bil hand use to put together simple vertical puzzle.  Pt demonstrated poor visual perceptual skills and needed mod/max assist to put puzzle together.  Gait training with grocery cart x 25' with mod assist.  She stated her feet hurt and sat down in an armchair iwht poor eccentric control.    Pt left resting in w/c with quick release belt applied and all needs within reach.  tx 2: 1515-1630, 75 min individual tx Pain: posterior  Neck, " a little", delcined meds  Son Alinda Moneyony and dtr in law Lupita LeashDonna present.  Expressive aphasia evident by pt calling her son the wrong name, and a pic of her dog the wrong name.  Pt reluctant to participate in tx, but eventually participated in bedside neuromuscular re-education via mulitmodal cues for 10 x 1 each: bil bridging, R/L straight leg raises, bil lower trunk rotation, 10 x 2 alternating ankle pumps, ankle circles and modified abdominal crunches. Pt sat up to don shoes.  Slip on Skechers are too tight; family will get some Velcro strap shoes.  Gait training with RW on level tile with min assist on straight path, mod assist during turns to steer RW and attend to route.  Sit> stand to look outside briefly while in glass hallway to [address].  W/c propulsion for activity  tolerance, using bil UEs with max cues for attention to task.  Pt easily frustrated and irritable when cued to continue with this task. Pt transferred back to bed with mod assist.  Bed alarm set and all needs within reach.    Son Alinda Moneyony informed this PT that pt had previously been treated for a pinched spinal lumbar nerve with limited results; pt refused surgery.  Pt had occasional falls/lowered herself to sitting on the floor at home, especially if she turned L, due to weakness/pain as she turned.  Pt had a LifeLine but was reluctant to use it.   See function navigator for current status.    Mariyana Fulop 06/01/2017, 8:21 AM

## 2017-06-02 ENCOUNTER — Inpatient Hospital Stay (HOSPITAL_COMMUNITY): Payer: Medicare Other | Admitting: Physical Therapy

## 2017-06-02 ENCOUNTER — Inpatient Hospital Stay (HOSPITAL_COMMUNITY): Payer: Medicare Other

## 2017-06-02 ENCOUNTER — Inpatient Hospital Stay (HOSPITAL_COMMUNITY): Payer: Medicare Other | Admitting: Occupational Therapy

## 2017-06-02 DIAGNOSIS — R410 Disorientation, unspecified: Secondary | ICD-10-CM

## 2017-06-02 DIAGNOSIS — F05 Delirium due to known physiological condition: Secondary | ICD-10-CM

## 2017-06-02 LAB — URINALYSIS, ROUTINE W REFLEX MICROSCOPIC
Bilirubin Urine: NEGATIVE
GLUCOSE, UA: NEGATIVE mg/dL
HGB URINE DIPSTICK: NEGATIVE
Ketones, ur: 5 mg/dL — AB
Nitrite: NEGATIVE
PROTEIN: 100 mg/dL — AB
SPECIFIC GRAVITY, URINE: 1.014 (ref 1.005–1.030)
Squamous Epithelial / LPF: NONE SEEN
pH: 6 (ref 5.0–8.0)

## 2017-06-02 MED ORDER — TRAZODONE HCL 50 MG PO TABS
25.0000 mg | ORAL_TABLET | Freq: Every evening | ORAL | Status: DC | PRN
  Filled 2017-06-02: qty 1

## 2017-06-02 NOTE — Significant Event (Signed)
Patient confused this morning.  She thinks she is school aged.  She refuses to eat or take her medications.  Attempted to give meds in chocolate pudding patient swallowed pudding but spit the medications out.  Notified Dr. Allena KatzPatel.  Will check patient for urinary tract infection that could be exacerbating her cognitive changes.  Dani Gobbleeardon, Vinayak Bobier J, RN

## 2017-06-02 NOTE — Progress Notes (Signed)
[facility]     PROGRESS NOTE  Subjective/Complaints:  Pt seen laying in bed this AM.  She did not sleep well overnight per nursing and is confused this AM.    ROS: Limited due to cognition  Objective: Vital Signs: Blood pressure (!) 134/45, pulse (!) 53, temperature 98.5 F (36.9 C), temperature source Oral, resp. rate 16, height 5\' 8"  (1.727 m), weight 62.5 kg (137 lb 12.6 oz), SpO2 96 %. No results found. No results for input(s): WBC, HGB, HCT, PLT in the last 72 hours. No results for input(s): NA, K, CL, GLUCOSE, BUN, CREATININE, CALCIUM in the last 72 hours.  Invalid input(s): CO CBG (last 3)  No results for input(s): GLUCAP in the last 72 hours.  Wt Readings from Last 3 Encounters:  06/02/17 62.5 kg (137 lb 12.6 oz)  05/27/17 64.6 kg (142 lb 6.7 oz)    Physical Exam:  BP (!) 134/45 (BP Location: Left Arm)   Pulse (!) 53   Temp 98.5 F (36.9 C) (Oral)   Resp 16   Ht 5\' 8"  (1.727 m)   Wt 62.5 kg (137 lb 12.6 oz)   SpO2 96%   BMI 20.95 kg/m  Constitutional: She appears well-developed. Frail  HENT: Normocephalic and atraumatic.  Eyes: EOM are normal. No discharge.  Cardiovascular: +Bradycardia  Respiratory: Effort normal and breath sounds normal.  GI: Bowel sounds are normal. Nondistended.. Musculoskeletal: She exhibits no edema or tenderness.  Neurological: A&Ox1 Expressive deficits She was able to follow simple commands  Motor: 4+/5 in Bilateral delt, bi, tri, grip (unchanged) LLE: 4/5 proximal to distal  RLE: 4/5 proximal to distal   Skin: Skin is warm and dry.  Psychiatric: Her affect is blunt. Her speech is delayed. She is slowed. Cognition and memory are impaired.    Assessment/Plan: 1. Functional deficits secondary to left PCA infarction which require 3+ hours per day of interdisciplinary therapy in a comprehensive inpatient rehab setting. Physiatrist is providing close team supervision and 24 hour management of active  medical problems listed below. Physiatrist and rehab team continue to assess barriers to discharge/monitor patient progress toward functional and medical goals.  Function:  Bathing Bathing position   Position: Shower  Bathing parts Body parts bathed by patient: Right arm, Left upper leg, Left arm, Chest, Right upper leg, Abdomen, Front perineal area, Buttocks, Right lower leg, Left lower leg Body parts bathed by helper: Back  Bathing assist Assist Level: Touching or steadying assistance(Pt > 75%)      Upper Body Dressing/Undressing Upper body dressing   What is the patient wearing?: Pull over shirt/dress     Pull over shirt/dress - Perfomed by patient: Thread/unthread right sleeve, Thread/unthread left sleeve, Put head through opening, Pull shirt over trunk Pull over shirt/dress - Perfomed by helper: Thread/unthread right sleeve, Thread/unthread left sleeve, Put head through opening        Upper body assist Assist Level: Supervision or verbal cues      Lower Body Dressing/Undressing Lower body dressing   What is the patient wearing?: Pants   Underwear - Performed by helper: Thread/unthread right underwear leg, Thread/unthread left underwear leg, Pull underwear up/down Pants- Performed by patient: Thread/unthread right pants leg, Thread/unthread left pants leg, Pull pants up/down Pants- Performed by helper: Pull pants up/down Non-skid slipper socks- Performed by patient: Don/doff right sock, Don/doff left sock                    Lower body  assist Assist for lower body dressing: Touching or steadying assistance (Pt > 75%)      Toileting Toileting   Toileting steps completed by patient: Adjust clothing prior to toileting, Performs perineal hygiene Toileting steps completed by helper: Adjust clothing after toileting Toileting Assistive Devices: Grab bar or rail  Toileting assist Assist level: Touching or steadying assistance (Pt.75%)   Transfers Chair/bed transfer    Chair/bed transfer method: Stand pivot Chair/bed transfer assist level: Touching or steadying assistance (Pt > 75%) Chair/bed transfer assistive device: Armrests, Walker, Bedrails     Locomotion Ambulation     Max distance: 80 Assist level: Moderate assist (Pt 50 - 74%)   Wheelchair   Type: Manual Max wheelchair distance: 15 Assist Level: Supervision or verbal cues  Cognition Comprehension Comprehension assist level: Understands basic 75 - 89% of the time/ requires cueing 10 - 24% of the time  Expression Expression assist level: Expresses basic 25 - 49% of the time/requires cueing 50 - 75% of the time. Uses single words/gestures.  Social Interaction Social Interaction assist level: Interacts appropriately 75 - 89% of the time - Needs redirection for appropriate language or to initiate interaction.  Problem Solving Problem solving assist level: Solves basic 25 - 49% of the time - needs direction more than half the time to initiate, plan or complete simple activities  Memory Memory assist level: Recognizes or recalls less than 25% of the time/requires cueing greater than 75% of the time    Medical Problem List and Plan: 1.  Aphasia with gait and balance disorder secondary to left PCA infarction   Cont CIR 2.  DVT Prophylaxis/Anticoagulation: Subcutaneous Lovenox 3. Pain Management: Hydrocodone as needed 4. Mood/memory loss: Provide emotional support. Provide tele sitter for safety   Fluoxetine started on 12/31 5. Neuropsych: This patient is not capable of making decisions on her own behalf. 6. Skin/Wound Care: Routine skin checks 7. Fluids/Electrolytes/Nutrition: Routine I&O's    BMP within acceptable range on 12/27, labs ordered for tomorrow   Remeron 7.5 started on 12/31   May need to consider NG if diet does not improve 8. Hypertension: Lisinopril 40 mg daily   Hydralazine 50 mg every 8 hours, increased to 75 on 12/31   Norvasc increased to 10 mg on 12/30    Labile, but  overall improving control 9. Bradycardia. Attempts at placement of pacemaker 05/21/2017 unsuccessful. Plan to continue to monitor. 10. Constipation. Laxative assistance 11. Hyperlipidemia. Pravachol 12. Prediabetes   Relatively controlled on 12/27 13. Hypoalbuminemia   Supplement initiated on 12/27 14. Acute blood loss anemia   Hemoglobin 11.2 on 12/27   Labs ordered for tomorrow   Continue to monitor 15. AKI    Cr 0.91 on 12/27   Labs ordered for tomorrow   Cont to monitor   Encourage fluids 16. Bradycardia   Monitor for symptoms with increased physical activity   ECG reviewed, suggesting brady, similar to previous.  Discussed with Cards for possible further recs 17. Confusion   Multifactorial   ?increase, UA ordered, Trazodone ordered to adjust sleep/wake cycle   Will consider d/c fluoxetine  LOS (Days) 6 A FACE TO FACE EVALUATION WAS PERFORMED  Ankit Karis Jubanil Patel 06/02/2017 8:33 AM

## 2017-06-02 NOTE — Progress Notes (Signed)
Speech Language Pathology Daily Session Note  Patient Details  Name: Nicole [name] MRN: [id number] Date of Birth: 1928/12/05  Today's Date: 06/02/2017 SLP Individual Time: 1015-1100 SLP Individual Time Calculation (min): 45 min  Short Term Goals: Week 1: SLP Short Term Goal 1 (Week 1): Pt will name familiar objects with min assist cues to recognize and correct verbal errors.   SLP Short Term Goal 2 (Week 1): Pt will utilize compensatory word finding strategies during structured and unstructured verbal tasks with min assist cues.   SLP Short Term Goal 3 (Week 1): Pt will utilize external aids to recall basic, daily information with mod assist verbal cues.   SLP Short Term Goal 4 (Week 1): Pt will complete basic, familiar tasks with mod assist for functional problem solving (i.e. task initiation, sequencing, organization).   SLP Short Term Goal 5 (Week 1): Pt will return demonstration of at least 2 safety precautions during functional tasks with mod assist verbal cues.    Skilled Therapeutic Interventions: Skilled ST services focused on cognitive skills. SLP facilitated functional problem solving skills, sorting objects by color requiring Min a verbal cues, however when asked to sort by category (letter/number) pt required max verbal and visual cues. Pt required max A verbal cues for orientation and implemented memory notebook. Pt demonstrated ability to record therapy event in witting, with initial preservation, however demonstrated monitoring of error and corrected with mod A verbal cues.Pt was left in room with call bell within reach. Recommend to continue skilled St services.      Function:  Eating Eating                 Cognition Comprehension Comprehension assist level: Understands basic 75 - 89% of the time/ requires cueing 10 - 24% of the time  Expression   Expression assist level: Expresses basic 50 - 74% of the time/requires cueing 25 - 49% of the time. Needs to repeat  parts of sentences.  Social Interaction Social Interaction assist level: Interacts appropriately 75 - 89% of the time - Needs redirection for appropriate language or to initiate interaction.  Problem Solving Problem solving assist level: Solves basic 25 - 49% of the time - needs direction more than half the time to initiate, plan or complete simple activities;Solves basic 50 - 74% of the time/requires cueing 25 - 49% of the time  Memory Memory assist level: Recognizes or recalls less than 25% of the time/requires cueing greater than 75% of the time    Pain Pain Assessment Pain Assessment: No/denies pain  Therapy/Group: Individual Therapy  Karisa Nesser  [facility] Ms[facility]ATCH 06/02/2017, 5:49 PM

## 2017-06-02 NOTE — Progress Notes (Signed)
Occupational Therapy Session Note  Patient Details  Name: Nicole [name] MRN: [id number] Date of Birth: [birth year]  Today's Date: 06/02/2017 OT Individual Time: 1100-1200 OT Individual Time Calculation (min): 60 min    Short Term Goals: Week 1:  OT Short Term Goal 1 (Week 1): Pt will complete shower transfer with min A  OT Short Term Goal 2 (Week 1): Pt will completed grooming in standing with CGA OT Short Term Goal 3 (Week 1): Pt will complete toileting task with CGA OT Short Term Goal 4 (Week 1): Pt will complete LB dressing with CGA   Skilled Therapeutic Interventions/Progress Updates:    Pt seen for OT session focusing on OOB tolerance, activity participation, orientation, and ADLs. Pt in supine upon arrival, initially refusing any OOB tx including bathing/dressing or ambulation. Pt's son arrived with [employer] for pt and then pt willing to come to sitting EOB in order to eat. She required encouragement to eat, though ate more than she has since hospital admission. Pt's RN arrived and therapist assisted with pt taking meds as she required encouragement and education to take pills due to disorientation, pt finally took all pills with significant time and encouragement.  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 While seated EOB, pt agreeable to taking turns of reading pages of book with therapist. Pt then voiced complaints of fatigue from unsupported sitting position, she was agreeable to transition into recliner. Required mod A to stand from EOB, initially strong posterior lean requiring return to bed for safety. Had pt then stand without RW and mod A then placed RW for pt to ambulate with. Required min-mod A for ambulation and assist for RW management as well as cuing for sequencing of turning to sit in chair.  Pt left in recliner at end of session, chair alarm activated, did not use QRB as pt's son was present. RN made aware and pt's son to contact RN  when he leaves. Pt disoriented x3 throughout session, referred to environmental signs and memory notebook for re-orientation though pt with no carry over btwn trials. Wrote events in memory notebook throughout session, required mod questioning cues to recall events of past 30 minutes within session.   Therapy Documentation Precautions:  Precautions Precautions: Fall Precaution Comments: Confusion Restrictions Weight Bearing Restrictions: No Pain:   No/ denies pain  See Function Navigator for Current Functional Status.   Therapy/Group: Individual Therapy  Dameir Gentzler L 06/02/2017, 6:44 AM

## 2017-06-02 NOTE — Plan of Care (Signed)
  Progressing Consults RH STROKE PATIENT EDUCATION Description See Patient Education module for education specifics  06/02/2017 1150 - Progressing by Dani Gobbleeardon, Kyrie Fludd J, RN RH SKIN INTEGRITY RH STG SKIN FREE OF INFECTION/BREAKDOWN Description Skin will remain free from infection/skin break down with Mod I assistance  06/02/2017 1150 - Progressing by Dani Gobbleeardon, Cali Cuartas J, RN   Not Progressing Consults Nutrition Consult-if indicated 06/02/2017 1150 - Not Progressing by Dani Gobbleeardon, Sahiti Joswick J, RN RH SAFETY RH STG ADHERE TO SAFETY PRECAUTIONS W/ASSISTANCE/DEVICE Description STG Adhere to Safety Precautions With Min Assistance/Device.  06/02/2017 1150 - Not Progressing by Dani Gobbleeardon, Tuesday Terlecki J, RN RH STG DECREASED RISK OF FALL WITH ASSISTANCE Description STG Decreased Risk of Fall With BJ'sMin Assistance.  06/02/2017 1150 - Not Progressing by Dani Gobbleeardon, Reann Dobias J, RN RH COGNITION-NURSING RH STG USES MEMORY AIDS/STRATEGIES W/ASSIST TO PROBLEM SOLVE Description STG Uses Memory Aids/Strategies With Min Assistance to Problem Solve.  06/02/2017 1150 - Not Progressing by Dani Gobbleeardon, Jaber Dunlow J, RN RH STG ANTICIPATES NEEDS/CALLS FOR ASSIST W/ASSIST/CUES Description STG Anticipates Needs/Calls for Assist With Min Assistance/Cues.  06/02/2017 1150 - Not Progressing by Dani Gobbleeardon, Paislyn Domenico J, RN  Patient more confused, unwilling to follow commands at times.  She got up unassisted today.  She has a poor appetite despite our attempts to encourage her to eat.

## 2017-06-02 NOTE — Progress Notes (Signed)
Physical Therapy Session Note  Patient Details  Name: Nicole [name] MRN: [id number] Date of Birth: 1929/02/01  Today's Date: 06/02/2017 PT Individual Time: 1400-1430 PT Individual Time Calculation (min): 30 min   Short Term Goals: Week 1:  PT Short Term Goal 1 (Week 1): pt will perform gait x 50' with min A in controlled environment PT Short Term Goal 2 (Week 1): pt will demo standign balance for functional task with min A  Skilled Therapeutic Interventions/Progress Updates:    Pt seated in recliner in room, agreeable to participate in therapy session. Pt reports no pain this PM and is eager to engage in therapy session. Sit to stand with Min Assist to RW. Standing balance with one UE support on RW and CGA while looking through family photographs and attempting to identify who or what is in the photo (son provided labeled photos for patient). Focus on upright posture in standing and maintaining balance while engaging in functional tasks. Pt continues to exhibit decreased safety awareness when it comes to sitting and does not follow cues for finding her chair safely and controlling her descent into chair. Pt left seated in recliner with chair alarm in place, needs in reach.  Therapy Documentation Precautions:  Precautions Precautions: Fall Precaution Comments: Confusion Restrictions Weight Bearing Restrictions: No  See Function Navigator for Current Functional Status.   Therapy/Group: Individual Therapy  Peter Congoaylor Cashel Bellina, PT, DPT  06/02/2017, 3:47 PM

## 2017-06-02 NOTE — Progress Notes (Signed)
Patients son brought patient a filet of fish sandwich from [employer] to encourage the patient to eat.  She ate over 1/2 of the sandwich and some of the french fries without much prompting.  I used this opportunity to retry administering her medications.  She perseverated on there being 5 pills instead of her usual 4 and required max cueing and time to swallow with encouragement from son.  Dani Gobbleeardon, Elimelech Houseman J, RN

## 2017-06-02 NOTE — Progress Notes (Signed)
Called patients son to inform him of his mothers refusal to eat or take medications and to get advice on ways to get her to eat.  Son said he fed patient yesterday evening and she spit everything back out and said it was nasty.  He said her mental status has changed drastically since her stroke, further indicating that she can no longer recall names of people in her family (calling her son Cindee Lameete).  He is concerned about her decline.  He asked her yesterday if she wasn't eating because she wanted to die and the patient denied wanting to die but that she "just wants to go home."  Son asking whether or not she would do better in her home setting.  Informed son that urinary tract infections can cause these behaviors and that we would be checking her for a UTI today.  Will update the team on this conversation.  Dani Gobbleeardon, Crist Kruszka J, RN

## 2017-06-02 NOTE — Progress Notes (Signed)
Physical Therapy Session Note  Patient Details  Name: Nicole [name] MRN: [id number] Date of Birth: 07/21/1928  Today's Date: 06/02/2017 PT Individual Time: 0800-0900 PT Individual Time Calculation (min): 60 min   Short Term Goals: Week 1:  PT Short Term Goal 1 (Week 1): pt will perform gait x 50' with min A in controlled environment PT Short Term Goal 2 (Week 1): pt will demo standign balance for functional task with min A  Skilled Therapeutic Interventions/Progress Updates:    Pt supine in bed sleeping upon therapist arrival. With encouragement pt is agreeable to participate in therapy session. Pt reports no pain this AM and declines to eat breakfast. Pt transfers with Min Assist with increased encouragement. Attempt ambulation with RW, pt refuses and reports she cannot walk. Ambulation in // bars with CGA 2 x 5 ft to show pt she can walk. Once pt realizes she can walk she is agreeable to ambulate with RW. Ambulation 2 x 50 ft with RW and Min Assist, max verbal cueing for safety when turning to sit, pt does not follow cues and attempts to sit before chair is behind her, Min Assist to make it safely to chair. Standing balance: reaching overhead and across midline with one UE support and CGA to place clips on basketball net. Pt is easily distracted throughout therapy session and needs encouragement to focus on therapy tasks. Pt left seated in w/c in room with QRB in place and needs in reach.  Therapy Documentation Precautions:  Precautions Precautions: Fall Precaution Comments: Confusion Restrictions Weight Bearing Restrictions: No   See Function Navigator for Current Functional Status.   Therapy/Group: Individual Therapy  Peter Congoaylor Berenise Hunton, PT, DPT  06/02/2017, 12:20 PM

## 2017-06-03 ENCOUNTER — Inpatient Hospital Stay (HOSPITAL_COMMUNITY): Payer: Medicare Other

## 2017-06-03 ENCOUNTER — Inpatient Hospital Stay (HOSPITAL_COMMUNITY): Payer: Medicare Other | Admitting: Physical Therapy

## 2017-06-03 ENCOUNTER — Encounter (HOSPITAL_COMMUNITY): Payer: Medicare Other | Admitting: Psychology

## 2017-06-03 ENCOUNTER — Inpatient Hospital Stay (HOSPITAL_COMMUNITY): Payer: Medicare Other | Admitting: Speech Pathology

## 2017-06-03 DIAGNOSIS — R4701 Aphasia: Secondary | ICD-10-CM

## 2017-06-03 LAB — CREATININE, SERUM
CREATININE: 0.99 mg/dL (ref 0.44–1.00)
GFR calc non Af Amer: 50 mL/min — ABNORMAL LOW (ref >60)
GFR, EST AFRICAN AMERICAN: 58 mL/min — AB (ref >60)

## 2017-06-03 NOTE — Consult Note (Signed)
Neuropsychological Consultation   Patient:   Nicole [name]   DOB:   10-18-1928  MR Number:  [id number]  Location:  [facility] [address] [facility] [id number] [place] [address] Dept: [phone] Loc: [phone]           Date of Service:   06/03/2017  Start Time:   3 PM End Time:   4 PM  Provider/Observer:  Arley Phenix, Psy.D.       Clinical Neuropsychologist       Billing Code/Service: [phone] 4 Units  Chief Complaint:    Nicole [name] is an 84 year old right-handed female with a history of hypertension and possible history of polio.  Patient's family reported that the patient has had cognitive decline over the past 5 or 6 months.  She presented on 05/21/2017 with altered mental status and no reports of recent head trauma.  MRI of the brain showed acute lacunar infarct of the anterior left thalamus.  There is also an associated severe left PCA stenosis.  There were some concerns over restlessness and limited awareness requiring a Recruitment consultant.  Reason for Service:  The patient was referred for neuropsychological/psychological consultation due to issues regarding her overall mental status and coping with both her acute stroke symptoms as well as possible reduced cognitive functioning over the past 5 or 6 months.  Below is the HPI for the current admission.  HPI: Nicole [name] a 84 y.o.right handed femalewith history of hypertension, question history polio per documentation. Per chart review patient lives downstairs apartment and son and daughter-in-law's home. Has a hired caregiver 3-4 hours during the week. Used a rolling walker for ambulation. Independent with bathing and dressing. Son and daughter reported patient with cognitive decline over the past 5-6 months. Presented 05/21/2017 with altered mental status no reports of recent trauma. In the ED patient bradycardic in the 40s systolic blood  pressure 200s. EKG Mobitz type II block. Cranial CT reviewed, unremarkable for acute process. Troponin negative. MRI the brain completed showing acute lacunar infarct of the anterior left thalamus. Associated severe left PCA stenosis. Echocardiogram with ejection fraction of 65% grade 2 diastolic dysfunction. Cardiology service is consulted plan for pacemaker implantation after initially patient not able to tolerate 05/21/2017. Blood pressure monitored on multiple antihypertensive medications. Carotid Dopplers in no ICA stenosis. Subcutaneous Lovenox for DVT prophylaxis.Patient has a Recruitment consultant due to restlessness and limited awareness.Physical and occupational therapy evaluations completed with recommendations of physical medicine rehabilitation consult. Patient was admitted for a comprehensive rehabilitation program  Current Status:  The patient did have the appearance of improving mental status.  She was oriented to person place and time but there are indications in her medical record that this is not been the case up until a couple of days ago.  Discussing the patient with her son, the son reports that there is been a significant improvement in her expressive language and initiation of verbal interactions over the past 24-48 hours.  The patient does acknowledge some weakness in her right hand but she is able to move it.  She denies any observed changes in her right foot functioning.  The patient indicated awareness and understanding of her fall risk and reports that her mood has been generally stable and she is not experiencing any significant depression or anxiety based symptoms.  The patient was able to show awareness as to what has happened to her with her most recent stroke and talk about a  prior stoke she had a couple of months earlier.  Behavioral Observation: Nicole [name]  presents as a 84 y.o.-year-old Right Caucasian Female who appeared her stated age. her dress was Appropriate and she was  Well Groomed and her manners were Appropriate to the situation.  her participation was indicative of Appropriate and Attentive behaviors.  There were any physical disabilities noted.  she displayed an appropriate level of cooperation and motivation.     Interactions:    Active Appropriate and Redirectable  Attention:   abnormal and attention span appeared shorter than expected for age  Memory:   abnormal; remote memory intact, recent memory impaired  Visuo-spatial:  not examined  Speech (Volume):  low  Speech:   non-fluent aphasia; slowed verbal fluency and indications of word finding difficulties and some circumlocutions.  Thought Process:  Coherent and Relevant  Though Content:  WNL; not suicidal  Orientation:   person, place and time/date  Judgment:   Fair  Planning:   Fair  Affect:    Appropriate  Mood:    Dysphoric  Insight:   Fair  Intelligence:   normal  Substance Use:  No concerns of substance abuse are reported.    Medical History:   Past Medical History:  Diagnosis Date  . Hypertension   . Polio    Psychiatric History:  No prior psychiatric history  Family Med/Psych History:  Family History  Problem Relation Age of Onset  . Cancer Mother        stomach  . Cancer Sister        breast    Risk of Suicide/Violence: virtually non-existent patient denies any suicidal or homicidal ideation.  Impression/DX:  Nicole [name] is an 84 year old right-handed female with a history of hypertension and possible history of polio.  Patient's family reported that the patient has had cognitive decline over the past 5 or 6 months.  She presented on 05/21/2017 with altered mental status and no reports of recent head trauma.  MRI of the brain showed acute lacunar infarct of the anterior left thalamus.  There is also an associated severe left PCA stenosis.  There were some concerns over restlessness and limited awareness requiring a Recruitment consultantsafety sitter.  The patient did have the  appearance of improving mental status.  She was oriented to person place and time but there are indications in her medical record that this is not been the case up until a couple of days ago.  Discussing the patient with her son, the son reports that there is been a significant improvement in her expressive language and initiation of verbal interactions over the past 24-48 hours.  The patient does acknowledge some weakness in her right hand but she is able to move it.  She denies any observed changes in her right foot functioning.  The patient indicated awareness and understanding of her fall risk and reports that her mood has been generally stable and she is not experiencing any significant depression or anxiety based symptoms.  The patient was able to show awareness as to what has happened to her with her most recent stroke and talk about a prior stoke she had a couple of months earlier.  Diagnosis:    Lacunar infarction, expressive language deficiits        Electronically Signed   _______________________ Arley PhenixJohn Stepehn Eckard, Psy.D.

## 2017-06-03 NOTE — Progress Notes (Signed)
Physical Therapy Note  Patient Details  Name: Nicole [name] MRN: [id number] Date of Birth: January 28, 1929 Today's Date: 06/03/2017    Time: 830-949 79 minutes  1:1 No c/o pain.  Pt very lethargic, requires max encouragement to get up and out of bed.  Pt performs stand pivot transfers with RW with min A.  Standing balance to take ornaments off of christmas tree with min A for sit to stand, min A for balance with 1 UE support, mod/max A for balance without UE support.  Gait training with focus on RW safety with turns and sitting with min A for gait, max cues for safety.  Pt able to walk 20' x 4, 100', 50'.  Nustep for activity tolerance and endurance level 3 x 5 minutes with 4 x cues to continue activity as pt stops due to fatigue and impaired attention.  Seated puzzle task with pt requiring max cuing to solve 24 piece puzzle. Pt requires frequent cues to stay awake throughout session. Pt left in bed with needs at hand, bed alarm on.   Giabella Duhart 06/03/2017, 9:50 AM

## 2017-06-03 NOTE — Progress Notes (Signed)
[facility]YSICAL MEDICINE & REHABILITATION     PROGRESS NOTE  Subjective/Complaints:  Pt seen laying in bed this AM.  She slept well per report.  Per nursing, confusion/restless improving.  She nods "no" to all questions.  ROS: Limited due to cognition  Objective: Vital Signs: Blood pressure (!) 159/37, pulse (!) 47, temperature 98 F (36.7 C), temperature source Oral, resp. rate 18, height 5\' 8"  (1.727 m), weight 62.6 kg (138 lb 0.1 oz), SpO2 95 %. No results found. No results for input(s): WBC, HGB, HCT, PLT in the last 72 hours. Recent Labs    06/02/17 2347  CREATININE 0.99   CBG (last 3)  No results for input(s): GLUCAP in the last 72 hours.  Wt Readings from Last 3 Encounters:  06/03/17 62.6 kg (138 lb 0.1 oz)  05/27/17 64.6 kg (142 lb 6.7 oz)    Physical Exam:  BP (!) 159/37   Pulse (!) 47   Temp 98 F (36.7 C) (Oral)   Resp 18   Ht 5\' 8"  (1.727 m)   Wt 62.6 kg (138 lb 0.1 oz)   SpO2 95%   BMI 20.98 kg/m  Constitutional: She appears well-developed. Frail  HENT: Normocephalic and atraumatic.  Eyes: EOM are normal. No discharge.  Cardiovascular: +Bradycardia  Respiratory: Effort normal and breath sounds normal.  GI: Bowel sounds are normal. Nondistended.. Musculoskeletal: She exhibits no edema or tenderness.  Neurological: A&Ox1 Expressive deficits She was able to follow simple commands  Motor: 4+/5 in Bilateral delt, bi, tri, grip (stable) LLE: 4/5 proximal to distal  RLE: 4/5 proximal to distal   Skin: Skin is warm and dry.  Psychiatric: Her affect is blunt. Her speech is delayed. She is slowed. Cognition and memory are impaired.    Assessment/Plan: 1. Functional deficits secondary to left PCA infarction which require 3+ hours per day of interdisciplinary therapy in a comprehensive inpatient rehab setting. Physiatrist is providing close team supervision and 24 hour management of active medical problems listed below. Physiatrist and rehab team  continue to assess barriers to discharge/monitor patient progress toward functional and medical goals.  Function:  Bathing Bathing position   Position: Shower  Bathing parts Body parts bathed by patient: Right arm, Left upper leg, Left arm, Chest, Right upper leg, Abdomen, Front perineal area, Buttocks, Right lower leg, Left lower leg Body parts bathed by helper: Back  Bathing assist Assist Level: Touching or steadying assistance(Pt > 75%)      Upper Body Dressing/Undressing Upper body dressing   What is the patient wearing?: Pull over shirt/dress     Pull over shirt/dress - Perfomed by patient: Thread/unthread right sleeve, Thread/unthread left sleeve, Put head through opening, Pull shirt over trunk Pull over shirt/dress - Perfomed by helper: Thread/unthread right sleeve, Thread/unthread left sleeve, Put head through opening        Upper body assist Assist Level: Supervision or verbal cues      Lower Body Dressing/Undressing Lower body dressing   What is the patient wearing?: Pants   Underwear - Performed by helper: Thread/unthread right underwear leg, Thread/unthread left underwear leg, Pull underwear up/down Pants- Performed by patient: Thread/unthread right pants leg, Thread/unthread left pants leg, Pull pants up/down Pants- Performed by helper: Pull pants up/down Non-skid slipper socks- Performed by patient: Don/doff right sock, Don/doff left sock                    Lower body assist Assist for lower body dressing: Touching or steadying assistance (  Pt > 75%)      Toileting Toileting   Toileting steps completed by patient: Adjust clothing prior to toileting, Performs perineal hygiene Toileting steps completed by helper: Adjust clothing after toileting Toileting Assistive Devices: Grab bar or rail  Toileting assist Assist level: Touching or steadying assistance (Pt.75%)   Transfers Chair/bed transfer   Chair/bed transfer method: Stand pivot Chair/bed transfer  assist level: Touching or steadying assistance (Pt > 75%) Chair/bed transfer assistive device: Armrests, Patent attorneyWalker     Locomotion Ambulation     Max distance: 50 Assist level: Touching or steadying assistance (Pt > 75%)   Wheelchair   Type: Manual Max wheelchair distance: 15 Assist Level: Supervision or verbal cues  Cognition Comprehension Comprehension assist level: Understands basic 75 - 89% of the time/ requires cueing 10 - 24% of the time  Expression Expression assist level: Expresses basic 50 - 74% of the time/requires cueing 25 - 49% of the time. Needs to repeat parts of sentences.  Social Interaction Social Interaction assist level: Interacts appropriately 75 - 89% of the time - Needs redirection for appropriate language or to initiate interaction.  Problem Solving Problem solving assist level: Solves basic 25 - 49% of the time - needs direction more than half the time to initiate, plan or complete simple activities, Solves basic 50 - 74% of the time/requires cueing 25 - 49% of the time  Memory Memory assist level: Recognizes or recalls less than 25% of the time/requires cueing greater than 75% of the time    Medical Problem List and Plan: 1.  Aphasia with gait and balance disorder secondary to left PCA infarction   Cont CIR 2.  DVT Prophylaxis/Anticoagulation: Subcutaneous Lovenox 3. Pain Management: Hydrocodone as needed 4. Mood/memory loss: Provide emotional support. Provide tele sitter for safety   Fluoxetine started on 12/31 5. Neuropsych: This patient is not capable of making decisions on her own behalf. 6. Skin/Wound Care: Routine skin checks 7. Fluids/Electrolytes/Nutrition: Routine I&O's    BMP within acceptable range on 12/27, labs ordered for tomorrow   Remeron 7.5 started on 12/31   May need to consider NG if diet does not improve 8. Hypertension: Lisinopril 40 mg daily   Hydralazine 50 mg every 8 hours, increased to 75 on 12/31   Norvasc increased to 10 mg on  12/30    Continues to be labile, but overall improving control 9. Bradycardia. Attempts at placement of pacemaker 05/21/2017 unsuccessful. Plan to continue to monitor. 10. Constipation. Laxative assistance 11. Hyperlipidemia. Pravachol 12. Prediabetes   Relatively controlled on 12/27 13. Hypoalbuminemia   Supplement initiated on 12/27 14. Acute blood loss anemia   Hemoglobin 11.2 on 12/27   Labs pending   Continue to monitor 15. AKI    Cr 0.99 on 1/1   Labs pending   Cont to monitor   Encourage fluids 16. Bradycardia   Monitor for symptoms with increased physical activity   ECG reviewed, suggesting brady, similar to previous.  Discussed with Cards for possible further recs, awaiting further recs 17. Confusion   Multifactorial   ?increase, UA relatively unremarkable, Ucx pending   Trazodone ordered to adjust sleep/wake cycle   Will consider d/c fluoxetine  LOS (Days) 7 A FACE TO FACE EVALUATION WAS PERFORMED  Ankit Karis Jubanil Patel 06/03/2017 8:24 AM

## 2017-06-03 NOTE — Progress Notes (Signed)
Occupational Therapy Session Note  Patient Details  Name: Nicole [name] MRN: [id number] Date of Birth: [birth year]  Today's Date: 06/03/2017 OT Individual Time: 1100-1130 OT Individual Time Calculation (min): 30 min  and Today's Date: 06/03/2017 OT Missed Time: 30 Minutes Missed Time Reason: Patient fatigue   Short Term Goals: Week 1:  OT Short Term Goal 1 (Week 1): Pt will complete shower transfer with min A  OT Short Term Goal 2 (Week 1): Pt will completed grooming in standing with CGA OT Short Term Goal 3 (Week 1): Pt will complete toileting task with CGA OT Short Term Goal 4 (Week 1): Pt will complete LB dressing with CGA   Skilled Therapeutic Interventions/Progress Updates:    Pt asleep upon arrival and required max multimodal cues to arouse.  Pt required max encouragement and min A to sit EOB.  Attempted to engage pt in simple puzzle (24 pieces) while seated EOB.  Pt required tot A to initiate locating puzzle pieces and place appropriately in puzzle. Pt required max verbal cues to keep her eyes open and attend to task.  Pt repeatedly stated that she was tired and cold.  Pt missed 30 mins skilled OT services 2/2 fatigue and decreased arousal.  Pt returned to supine in bed with all needs within reach and bed alarm activated.   Therapy Documentation Precautions:  Precautions Precautions: Fall Precaution Comments: Confusion Restrictions Weight Bearing Restrictions: No General: General OT Amount of Missed Time: 30 Minutes   Pain: Pain Assessment Pain Assessment: No/denies pain  See Function Navigator for Current Functional Status.   Therapy/Group: Individual Therapy  Rich BraveLanier, Yajayra Feldt Chappell 06/03/2017, 11:40 AM

## 2017-06-03 NOTE — Progress Notes (Signed)
Speech Language Pathology Daily Session Note  Patient Details  Name: Nicole [name] MRN: [id number] Date of Birth: 1928-09-23  Today's Date: 06/03/2017 SLP Individual Time: 1303-1403 SLP Individual Time Calculation (min): 60 min  Short Term Goals: Week 1: SLP Short Term Goal 1 (Week 1): Pt will name familiar objects with min assist cues to recognize and correct verbal errors.   SLP Short Term Goal 2 (Week 1): Pt will utilize compensatory word finding strategies during structured and unstructured verbal tasks with min assist cues.   SLP Short Term Goal 3 (Week 1): Pt will utilize external aids to recall basic, daily information with mod assist verbal cues.   SLP Short Term Goal 4 (Week 1): Pt will complete basic, familiar tasks with mod assist for functional problem solving (i.e. task initiation, sequencing, organization).   SLP Short Term Goal 5 (Week 1): Pt will return demonstration of at least 2 safety precautions during functional tasks with mod assist verbal cues.    Skilled Therapeutic Interventions:  Pt was seen for skilled ST targeting cognitive goals.  Pt was awake but lethargic upon therapist's arrival.  Pt agreeable to participating in therapies with minimal encouragement.  Pt requested to use the bathroom and needed max assist multimodal cues for safety due to impulsivity and fatigue resulting in pt sitting down prematurely before it was safe to do so.  Therapist facilitated the session with a novel card game to address problem solving and recall.  Pt's performance was variable throughout activity with pt needing anywhere from min to max assist to plan and execute a problem solving strategy as well as to recall task rules and procedures.  Pt was returned to room and left in recliner with call bell within reach.  Pt needed mod-max assist to return demonstration for use of call bell.   Chair alarm set and quick release belt donned for safety.  Continue per current plan of care.     Function:  Eating Eating                 Cognition Comprehension Comprehension assist level: Understands basic 90% of the time/cues < 10% of the time  Expression   Expression assist level: Expresses basic 50 - 74% of the time/requires cueing 25 - 49% of the time. Needs to repeat parts of sentences.  Social Interaction Social Interaction assist level: Interacts appropriately 75 - 89% of the time - Needs redirection for appropriate language or to initiate interaction.  Problem Solving Problem solving assist level: Solves basic 25 - 49% of the time - needs direction more than half the time to initiate, plan or complete simple activities  Memory Memory assist level: Recognizes or recalls 25 - 49% of the time/requires cueing 50 - 75% of the time    Pain Pain Assessment Pain Assessment: No/denies pain  Therapy/Group: Individual Therapy  Lexi Conaty, Melanee SpryNicole L 06/03/2017, 2:11 PM

## 2017-06-04 ENCOUNTER — Inpatient Hospital Stay (HOSPITAL_COMMUNITY): Payer: Medicare Other

## 2017-06-04 ENCOUNTER — Inpatient Hospital Stay (HOSPITAL_COMMUNITY): Payer: Medicare Other | Admitting: Occupational Therapy

## 2017-06-04 ENCOUNTER — Inpatient Hospital Stay (HOSPITAL_COMMUNITY): Payer: Medicare Other | Admitting: Speech Pathology

## 2017-06-04 MED ORDER — NITROFURANTOIN MACROCRYSTAL 100 MG PO CAPS
100.0000 mg | ORAL_CAPSULE | Freq: Two times a day (BID) | ORAL | Status: DC
  Administered 2017-06-04 – 2017-06-07 (×7): 100 mg via ORAL
  Filled 2017-06-04 (×8): qty 1

## 2017-06-04 MED ORDER — MIRTAZAPINE 15 MG PO TABS
7.5000 mg | ORAL_TABLET | Freq: Every day | ORAL | Status: DC
  Administered 2017-06-04: 7.5 mg via ORAL
  Filled 2017-06-04: qty 1

## 2017-06-04 NOTE — Progress Notes (Signed)
Social Work Patient ID: Nicole [name], female   DOB: January 28, 1929, 84 y.o.   MRN: [id number]   CSW met with pt and then her son and dtr-in-law entered the room, so met with them as well, to update them on team conference discussion.  They are pleased to learn of 06-10-17 d/c and will be ready for pt to return home.  CSW updated son on MD starting an appetite stimulant and a small dose of an antidepressant.  RN to update them on urine culture results.  Pt's son said pt was great a couple of days ago and now more confused again.  He is somewhat hopeful that it is a UTI since that may explain the confusion.  Pt has a walker and tub aid at home.  CSW will help with other DME and f/u therapies at home.  CSW remains available to assist as needed.

## 2017-06-04 NOTE — Progress Notes (Signed)
Physical Therapy Note  Patient Details  Name: Nicole [name] MRN: [id number] Date of Birth: 12/22/1928 Today's Date: 06/04/2017  0900-1015, 75 min individual tx Pain: none  Bed mobiity to sit EOB with min assist for trunk, with LOB backwards as she was donning shoes with assistance.  Sit>< stand for stand pivot transfer with max assist, with posterior lean.  Pt stated she wanted to use toilet.  Toilet transfer with max assist for continent voiding.  With urging, pt drank a few oz of OJ and ate 1 bite of bacon and 1 of oatmeal.  She refused offers of other food.  Pt and PT looked through her family picture book, recognizing and correctly naming ,50% of people.  Nustep at level 4 x 7 minutes for activity tolerance and flexiblity.  Gait training with RW on level tile, limited by pt's dizziness and unsafely pushing w/c too far ahead of her.  BP 168/44, HR 49 in sitting.  Returned pt to room and informed Chelsea, RN of BP and dizziness.  Seated in w/c, pt named items the children's book that she wrote 75% accurately with word finding problems for remaining 25%.    Pt much more interactive and insightful today, asking "what happened to me, that I cannot stand up?" PT explained CVA, and recommended she eat and drink more to avoid dizziness.  Pt agreed to drink about 1/2 of a choc Ensure.  Pt left resting in w/c with quick release belt applied and all needs within reach, with Leeroy Bockhelsea, RN in attendance.  See function navigator for current status.  Lenaya Pietsch 06/04/2017, 8:07 AM

## 2017-06-04 NOTE — Progress Notes (Signed)
Occupational Therapy Session Note  Patient Details  Name: Nicole [name] MRN: [id number] Date of Birth: 02-10-1929  Today's Date: 06/04/2017 OT Individual Time: 1100-1145 OT Individual Time Calculation (min): 45 min    Short Term Goals: Week 1:  OT Short Term Goal 1 (Week 1): Pt will complete shower transfer with min A  OT Short Term Goal 2 (Week 1): Pt will completed grooming in standing with CGA OT Short Term Goal 3 (Week 1): Pt will complete toileting task with CGA OT Short Term Goal 4 (Week 1): Pt will complete LB dressing with CGA       Skilled Therapeutic Interventions/Progress Updates:    Pt was received in w/c already dressed.  Pt declined shower as she stated she was too cold.  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  Tactile guiding for forward wt shift. Cues to push through toes.  Standing tolerance only 30 sec at a time.  Also worked on forward toe taps but she had great difficulty following directions. Pt c/o pain in her L SI region by pointing to point of pain. Provided with hot pack.  Blood pressure checked :  157/33  Moved onto seated activity of UE AROM with dowel bar.  She completed 2 sets of chest  Presses but became very tired keeping her eyes closed.  Suggested pt lay down.  Chair set up several feet from bed. Pt able to stand to RW and ambulate to bed with steadying A. Min A into bed. Bed alarm set.     Therapy Documentation Precautions:  Precautions Precautions: Fall Precaution Comments: Confusion Restrictions Weight Bearing Restrictions: No General:   Vital Signs: Therapy Vitals BP: (!) 146/42 Pain: Pain Assessment Pain Assessment:  Faces Faces Pain Scale: Hurts whole lot Pain Location: Buttocks Pain Orientation: Left Pain Descriptors / Indicators: Sharp Pain Onset: With Activity Pain Intervention(s): Heat applied;Rest ADL: ADL Eating: Set up Where Assessed-Eating: Wheelchair Grooming: Minimal assistance Where Assessed-Grooming: Standing at sink Upper Body Bathing: Minimal assistance Where Assessed-Upper Body Bathing: Shower Lower Body Bathing: Minimal assistance Where Assessed-Lower Body Bathing: Shower Upper Body Dressing: Setup Where Assessed-Upper Body Dressing: Wheelchair Lower Body Dressing: Moderate assistance Where Assessed-Lower Body Dressing: Wheelchair Toileting: Minimal assistance Where Assessed-Toileting: Teacher, adult educationToilet Toilet Transfer: Moderate assistance Toilet Transfer Method: Stand pivot Toilet Transfer Equipment: AcupuncturistGrab bars Walk-In Shower Transfer: Moderate assistance Film/video editorWalk-In Shower Transfer Method: Warden/rangertand pivot Walk-In Shower Equipment: Shower seat with back, Grab bars(from w/c) ADL Comments: cues for thoroughness Vision   Perception    Praxis   Exercises:   Other Treatments:    See Function Navigator for Current Functional Status.   Therapy/Group: Individual Therapy  SAGUIER,JULIA 06/04/2017, 12:21 PM

## 2017-06-04 NOTE — Progress Notes (Signed)
[facility]IC[facility] & [facility]ILITATION     PROGRESS NOTE  Subjective/Complaints:  Still confused. Just waking up, slow to arouse  ROS: Limited due to cognitive/behavioral    Objective: Vital Signs: Blood pressure (!) 178/52, pulse (!) 53, temperature 98 F (36.7 C), temperature source Oral, resp. rate 16, height 5\' 8"  (1.727 m), weight 62.2 kg (137 lb 2 oz), SpO2 95 %. No results found. No results for input(s): WBC, HGB, HCT, PLT in the last 72 hours. Recent Labs    06/02/17 2347  CREATININE 0.99   CBG (last 3)  No results for input(s): GLUCAP in the last 72 hours.  Wt Readings from Last 3 Encounters:  06/04/17 62.2 kg (137 lb 2 oz)  05/27/17 64.6 kg (142 lb 6.7 oz)    Physical Exam:  BP (!) 178/52   Pulse (!) 53   Temp 98 F (36.7 C) (Oral)   Resp 16   Ht 5\' 8"  (1.727 m)   Wt 62.2 kg (137 lb 2 oz)   SpO2 95%   BMI 20.85 kg/m  Constitutional: She appears well-developed. Frail  HENT: Normocephalic and atraumatic.  Eyes: EOM are normal. No discharge.  Cardiovascular: bradycardia Respiratory: Effort normal and breath sounds normal.  GI: Bowel sounds are normal. Nondistended.. Musculoskeletal: She exhibits no edema or tenderness.  Neurological: A&Ox1 Expressive language deficits She was able to follow simple commands  Motor: 4+/5 in Bilateral delt, bi, tri, grip (no change LLE: 4/5 proximal to distal  RLE: 4/5 proximal to distal   Skin: Skin is warm and dry.  Psychiatric: Her affect is blunt. Her speech is delayed. She is slowed. Cognition and memory are impaired.    Assessment/Plan: 1. Functional deficits secondary to left PCA infarction which require 3+ hours per day of interdisciplinary therapy in a comprehensive inpatient rehab setting. Physiatrist is providing close team supervision and 24 hour management of active medical problems listed below. Physiatrist and rehab team continue to assess barriers to discharge/monitor patient progress toward  functional and medical goals.  Function:  Bathing Bathing position   Position: Shower  Bathing parts Body parts bathed by patient: Right arm, Left upper leg, Left arm, Chest, Right upper leg, Abdomen, Front perineal area, Buttocks, Right lower leg, Left lower leg Body parts bathed by helper: Back  Bathing assist Assist Level: Touching or steadying assistance(Pt > 75%)      Upper Body Dressing/Undressing Upper body dressing   What is the patient wearing?: Pull over shirt/dress     Pull over shirt/dress - Perfomed by patient: Thread/unthread right sleeve, Thread/unthread left sleeve, Put head through opening, Pull shirt over trunk Pull over shirt/dress - Perfomed by helper: Thread/unthread right sleeve, Thread/unthread left sleeve, Put head through opening        Upper body assist Assist Level: Supervision or verbal cues      Lower Body Dressing/Undressing Lower body dressing   What is the patient wearing?: Pants   Underwear - Performed by helper: Thread/unthread right underwear leg, Thread/unthread left underwear leg, Pull underwear up/down Pants- Performed by patient: Thread/unthread right pants leg, Thread/unthread left pants leg, Pull pants up/down Pants- Performed by helper: Pull pants up/down Non-skid slipper socks- Performed by patient: Don/doff right sock, Don/doff left sock                    Lower body assist Assist for lower body dressing: Touching or steadying assistance (Pt > 75%)      Toileting Toileting   Toileting steps  completed by patient: Adjust clothing prior to toileting, Performs perineal hygiene Toileting steps completed by helper: Adjust clothing after toileting Toileting Assistive Devices: Grab bar or rail  Toileting assist Assist level: Touching or steadying assistance (Pt.75%)   Transfers Chair/bed transfer   Chair/bed transfer method: Stand pivot Chair/bed transfer assist level: Touching or steadying assistance (Pt > 75%) Chair/bed  transfer assistive device: Armrests, Patent attorneyWalker     Locomotion Ambulation     Max distance: 50 Assist level: Touching or steadying assistance (Pt > 75%)   Wheelchair   Type: Manual Max wheelchair distance: 15 Assist Level: Supervision or verbal cues  Cognition Comprehension Comprehension assist level: Understands basic 90% of the time/cues < 10% of the time  Expression Expression assist level: Expresses basic 50 - 74% of the time/requires cueing 25 - 49% of the time. Needs to repeat parts of sentences.  Social Interaction Social Interaction assist level: Interacts appropriately 75 - 89% of the time - Needs redirection for appropriate language or to initiate interaction.  Problem Solving Problem solving assist level: Solves basic 25 - 49% of the time - needs direction more than half the time to initiate, plan or complete simple activities  Memory Memory assist level: Recognizes or recalls 25 - 49% of the time/requires cueing 50 - 75% of the time    Medical Problem List and Plan: 1.  Aphasia with gait and balance disorder secondary to left PCA infarction   Cont CIR---motivate as possible 2.  DVT Prophylaxis/Anticoagulation: Subcutaneous Lovenox 3. Pain Management: Hydrocodone as needed 4. Mood/memory loss: Provide emotional support. Provide tele sitter for safety   Fluoxetine started on 12/31--observe for effect 5. Neuropsych: This patient is not capable of making decisions on her own behalf. 6. Skin/Wound Care: Routine skin checks 7. Fluids/Electrolytes/Nutrition: Routine I&O's    BMP within acceptable range on 12/27    Remeron 7.5 started on 12/31   Encouraging po intake.---check prealbumin with next labs 8. Hypertension: Lisinopril 40 mg daily   Hydralazine 50 mg every 8 hours, increased to 75 on 12/31   Norvasc increased to 10 mg on 12/30    Continues to be labile, but overall improving control 9. Bradycardia. Attempts at placement of pacemaker 05/21/2017 unsuccessful. Plan to  continue to monitor. 10. Constipation. Laxative assistance 11. Hyperlipidemia. Pravachol 12. Prediabetes   Relatively controlled on 12/27 13. Hypoalbuminemia   Supplement initiated on 12/27 14. Acute blood loss anemia   Hemoglobin 11.2 on 12/27   Continue to monitor 15. AKI    Cr 0.99 on 1/1   Cont to monitor   Encourage fluids 16. Bradycardia   Monitor for symptoms with increased physical activity   ECG reviewed, suggesting brady, similar to previous.  Discussed with Cards for possible further recs, awaiting further recs 17. Confusion   Multifactorial   ?increase, UA relatively unremarkable, Ucx still pending   Trazodone stopped   Will consider d/c fluoxetine  LOS (Days) 8 A FACE TO FACE EVALUATION WAS PERFORMED  Shlomie Romig T 06/04/2017 8:23 AM

## 2017-06-04 NOTE — Patient Care Conference (Signed)
Inpatient RehabilitationTeam Conference and Plan of Care Update Date: 06/03/2017   Time: 12:40 PM    Patient Name: Nicole [name]      Medical Record Number: [id number]  Date of Birth: December 14, 1928 Sex: Female         Room/Bed: 4M07C/4M07C-01 Payor Info: Payor: MEDICARE / Plan: MEDICARE PART A AND B / Product Type: *No Product type* /    Admitting Diagnosis: L CVA  Admit Date/Time:  05/27/2017  4:57 PM Admission Comments: No comment available   Primary Diagnosis:  <principal problem not specified> Principal Problem: <principal problem not specified>  Patient Active Problem List   Diagnosis Date Noted  . Non-fluent aphasia   . Subacute confusional state   . AKI (acute kidney injury) (HCC)   . Labile blood pressure   . Bradycardia   . Hypertensive crisis   . Essential hypertension   . Prediabetes   . Hypoalbuminemia due to protein-calorie malnutrition (HCC)   . Acute blood loss anemia   . Lacunar infarction 05/27/2017  . Hyperlipidemia   . Slow transit constipation   . Cerebral thrombosis with cerebral infarction 05/22/2017  . Pacemaker   . Heart block 05/21/2017  . Memory deficit 05/21/2017  . Acute encephalopathy 05/21/2017  . Mobitz type 2 second degree heart block   . Hypertensive urgency 05/20/2017    Expected Discharge Date: Expected Discharge Date: 06/10/17  Team Members Present: Physician leading conference: Dr. Maryla Morrow Social Worker Present: Amada Jupiter, LCSW Nurse Present: Allayne Stack, RN PT Present: Judieth Keens, PT OT Present: Roney Mans, OT SLP Present: Jackalyn Lombard, SLP     Current Status/Progress Goal Weekly Team Focus  Medical   Aphasia with gait and balance disorder secondary to left PCA infarction  Improve mobility, safety, cognition, confusion, diet  See above   Bowel/Bladder   Pt continent of bowel and bladder. LBM 06/01/17  Pt will manage bowel and bladder with supervision assist at discharge   Monitor for constipation. Encourage  use of stool softners.    Swallow/Nutrition/ Hydration             ADL's   bathing/dressing-min A; toileting-mod A; max encouragement to actively participate; significant cognitive deficits; tot A for orientation; max verbal cues for one step commands  supervision overall  BADL retraining, cognitive remediation, functional transfers, family educaiton   Mobility   min A gait, max A safety  supervision  safety awareness, balance   Communication   decline in function secondary to increased confusion, mod-max assist   supervision   word finding, awareness of verbal errors, conveying her needs and wants to caregivers effectively   Safety/Cognition/ Behavioral Observations  decline in function secondary to increased confusion, mod-max assist   min-supervision for basic  basic problem solving, safety awareness, participation in therapies, recall of daily information    Pain   No complaints of pain.   <3 out of 10.   assess for pain q shift and prn. Treat with tylenol as needed.    Skin   Clean, dry, intact   no new breakdown.   Assess skin q shift and prn. Encourage pressure relief.     Rehab Goals Patient on target to meet rehab goals: Yes Rehab Goals Revised: none - pt's first conference *See Care Plan and progress notes for long and short-term goals.     Barriers to Discharge  Current Status/Progress Possible Resolutions Date Resolved   Physician    Medical stability     See above  Therapies,  follow labs, optimize BP meds, cards recs for bradycardia, urine studies      Nursing                  PT                    OT                  SLP                SW                Discharge Planning/Teaching Needs:  Pt to return to son's home where she lives on the bottom floor.  Son will arrange 24/7 supervision for pt between family members and paid caregivers.  Family education to occur with son and any hired caregivers prior to d/c.   Team Discussion:  Dr. Allena KatzPatel is adjusting BP  medications and has started pt on something for depression.  Cardiology has been consulted due to bradycardia and we await urine culture results, this being checked due to confusion.  Pt is very lethargic today and max A to participate.  Once she is up, she is min A with rolling walker.  Her cognition is overall poor.  Goals are for supervision.  Revisions to Treatment Plan:  none    Continued Need for Acute Rehabilitation Level of Care: The patient requires daily medical management by a physician with specialized training in physical medicine and rehabilitation for the following conditions: Daily direction of a multidisciplinary physical rehabilitation program to ensure safe treatment while eliciting the highest outcome that is of practical value to the patient.: Yes Daily medical management of patient stability for increased activity during participation in an intensive rehabilitation regime.: Yes Daily analysis of laboratory values and/or radiology reports with any subsequent need for medication adjustment of medical intervention for : Neurological problems;Blood pressure problems;Urological problems  Thelda Gagan, Vista DeckJennifer Capps 06/04/2017, 11:56 AM

## 2017-06-04 NOTE — Progress Notes (Signed)
Speech Language Pathology Weekly Progress and Session Note  Patient Details  Name: Nicole [name] MRN: [id number] Date of Birth: 1929-03-31  Beginning of progress report period:  May 29, 2017  End of progress report period: June 04, 2017  Today's Date: 06/04/2017 SLP Individual Time: 1405-1500 SLP Individual Time Calculation (min): 55 min  Short Term Goals: Week 1: SLP Short Term Goal 1 (Week 1): Pt will name familiar objects with min assist cues to recognize and correct verbal errors.   SLP Short Term Goal 1 - Progress (Week 1): Met SLP Short Term Goal 2 (Week 1): Pt will utilize compensatory word finding strategies during structured and unstructured verbal tasks with min assist cues.   SLP Short Term Goal 2 - Progress (Week 1): Met SLP Short Term Goal 3 (Week 1): Pt will utilize external aids to recall basic, daily information with mod assist verbal cues.   SLP Short Term Goal 3 - Progress (Week 1): Progressing toward goal SLP Short Term Goal 4 (Week 1): Pt will complete basic, familiar tasks with mod assist for functional problem solving (i.e. task initiation, sequencing, organization).   SLP Short Term Goal 4 - Progress (Week 1): Progressing toward goal SLP Short Term Goal 5 (Week 1): Pt will return demonstration of at least 2 safety precautions during functional tasks with mod assist verbal cues.   SLP Short Term Goal 5 - Progress (Week 1): Met    New Short Term Goals: Week 2: SLP Short Term Goal 1 (Week 2): STG=LTG due to remaining length of stay   Weekly Progress Updates:  Despite recent functional decline in the setting of acute UTI, pt has made slow functional gains and had become min assist for word finding and awareness of verbal errors.  Pt needed mod-max assist for functional problem solving and safety awareness even prior to UTI which SLP suspects to be related to poor effort given family's reports of pt's previous level of function.  While pt currently needs  mod-max assist for functional communication secondary to increased confusion, SLP is hopeful that once underlying infection is treated that pt will return to her previously known baseline.  Pt and family education has been initiated at this point and SLP is available for further education should family need/request it.  As a result, pt would continue to benefit from skilled ST while inpatient in order to maximize functional independence and reduce burden of care prior to discharge.  Anticipate that pt will need 24/7 supervision at discharge.     Intensity: Minumum of 1-2 x/day, 30 to 90 minutes Frequency: 3 to 5 out of 7 days Duration/Length of Stay: 10-14 days  Treatment/Interventions: Cognitive remediation/compensation;Cueing hierarchy;Environmental controls;Internal/external aids;Speech/Language facilitation;Patient/family education   Daily Session  Skilled Therapeutic Interventions: Pt was seen for skilled ST targeting family education and cognitive-linguistic goals.  Pt's sons and daughters in law were present during today's therapy session and remained actively engaged during training.  SLP discussed pt's current goals and progress in therapies, including recent decline in function secondary to UTI with resultant increase in fatigue as well as confusion.  SLP also discussed team's recommendations that pt have 24/7 supervision at discharge secondary to mobility and cognitive deficits.  Pt's family is already prepared for this and has made necessary accommodations for when pt discharges home.  SLP provided skilled instruction regarding cognitive hierarchy as well as techniques to maximize pt's functional communication.  All questions were answered to their satisfaction at this time.  Pt remains confused but was  more alert today and agreeable to participate in therapies with minimal encouragement.  She needed mod-max assist to recognize and correct verbal errors which were more confabulatory in nature  which SLP suspects to be related to acute confusion.  Pt was handed off to OT at the end of today's therapy session.  Goals updated on this date to reflect current progress and plan of care.       Function:   Eating Eating              Cognition Comprehension Comprehension assist level: Follows basic conversation/direction with extra time/assistive device  Expression   Expression assist level: Expresses basic 75 - 89% of the time/requires cueing 10 - 24% of the time. Needs helper to occlude trach/needs to repeat words.  Social Interaction Social Interaction assist level: Interacts appropriately 75 - 89% of the time - Needs redirection for appropriate language or to initiate interaction.  Problem Solving Problem solving assist level: Solves basic 25 - 49% of the time - needs direction more than half the time to initiate, plan or complete simple activities  Memory Memory assist level: Recognizes or recalls 25 - 49% of the time/requires cueing 50 - 75% of the time   General    Pain Pain Assessment Pain Assessment: No/denies pain   Therapy/Group: Individual Therapy  Michaela Broski, Selinda Orion 06/04/2017, 3:57 PM

## 2017-06-04 NOTE — Progress Notes (Signed)
Social Work Patient ID: Nicole [name], female   DOB: Apr 25, 1929, 84 y.o.   MRN: [id number]     Nicole [name], Nicole [name]  Social Worker    Patient Care Conference  Signed  Date of Service:  06/04/2017 11:56 AM          Signed          [] Hide copied text  [] Hover for details   Inpatient RehabilitationTeam Conference and Plan of Care Update Date: 06/03/2017   Time: 12:40 PM      Patient Name: Nicole [name]      Medical Record Number: [id number]  Date of Birth: [birth year] Sex: Female         Room/Bed: 4M07C/4M07C-01 Payor Info: Payor: MEDICARE / Plan: MEDICARE PART A AND B / Product Type: *No Product type* /     Admitting Diagnosis: L CVA  Admit Date/Time:  05/27/2017  4:57 PM Admission Comments: No comment available    Primary Diagnosis:  <principal problem not specified> Principal Problem: <principal problem not specified>       Patient Active Problem List    Diagnosis Date Noted  . Non-fluent aphasia    . Subacute confusional state    . AKI (acute kidney injury) (HCC)    . Labile blood pressure    . Bradycardia    . Hypertensive crisis    . Essential hypertension    . Prediabetes    . Hypoalbuminemia due to protein-calorie malnutrition (HCC)    . Acute blood loss anemia    . Lacunar infarction 05/27/2017  . Hyperlipidemia    . Slow transit constipation    . Cerebral thrombosis with cerebral infarction 05/22/2017  . Pacemaker    . Heart block 05/21/2017  . Memory deficit 05/21/2017  . Acute encephalopathy 05/21/2017  . Mobitz type 2 second degree heart block    . Hypertensive urgency 05/20/2017      Expected Discharge Date: Expected Discharge Date: 06/10/17   Team Members Present: Physician leading conference: Dr. Maryla Morrow Social Worker Present: Amada Jupiter, LCSW Nurse Present: Allayne Stack, RN PT Present: Judieth Keens, PT OT Present: Roney Mans, OT SLP Present: Jackalyn Lombard, SLP       Current Status/Progress Goal Weekly Team Focus   Medical     Aphasia with gait and balance disorder secondary to left PCA infarction  Improve mobility, safety, cognition, confusion, diet  See above   Bowel/Bladder     Pt continent of bowel and bladder. LBM 06/01/17  Pt will manage bowel and bladder with supervision assist at discharge   Monitor for constipation. Encourage use of stool softners.    Swallow/Nutrition/ Hydration               ADL's     bathing/dressing-min A; toileting-mod A; max encouragement to actively participate; significant cognitive deficits; tot A for orientation; max verbal cues for one step commands  supervision overall  BADL retraining, cognitive remediation, functional transfers, family educaiton   Mobility     min A gait, max A safety  supervision  safety awareness, balance   Communication     decline in function secondary to increased confusion, mod-max assist   supervision   word finding, awareness of verbal errors, conveying her needs and wants to caregivers effectively   Safety/Cognition/ Behavioral Observations   decline in function secondary to increased confusion, mod-max assist   min-supervision for basic  basic problem solving, safety awareness, participation in therapies, recall of daily information    Pain  No complaints of pain.   <3 out of 10.   assess for pain q shift and prn. Treat with tylenol as needed.    Skin     Clean, dry, intact   no new breakdown.   Assess skin q shift and prn. Encourage pressure relief.      Rehab Goals Patient on target to meet rehab goals: Yes Rehab Goals Revised: none - pt's first conference *See Care Plan and progress notes for long and short-term goals.      Barriers to Discharge   Current Status/Progress Possible Resolutions Date Resolved   Physician     Medical stability     See above  Therapies, follow labs, optimize BP meds, cards recs for bradycardia, urine studies      Nursing                 PT                    OT                 SLP             SW              Discharge Planning/Teaching Needs:  Pt to return to son's home where she lives on the bottom floor.  Son will arrange 24/7 supervision for pt between family members and paid caregivers.  Family education to occur with son and any hired caregivers prior to d/c.   Team Discussion:  Dr. Allena KatzPatel is adjusting BP medications and has started pt on something for depression.  Cardiology has been consulted due to bradycardia and we await urine culture results, this being checked due to confusion.  Pt is very lethargic today and max A to participate.  Once she is up, she is min A with rolling walker.  Her cognition is overall poor.  Goals are for supervision.  Revisions to Treatment Plan:  none    Continued Need for Acute Rehabilitation Level of Care: The patient requires daily medical management by a physician with specialized training in physical medicine and rehabilitation for the following conditions: Daily direction of a multidisciplinary physical rehabilitation program to ensure safe treatment while eliciting the highest outcome that is of practical value to the patient.: Yes Daily medical management of patient stability for increased activity during participation in an intensive rehabilitation regime.: Yes Daily analysis of laboratory values and/or radiology reports with any subsequent need for medication adjustment of medical intervention for : Neurological problems;Blood pressure problems;Urological problems   [name], Vista DeckJennifer Capps 06/04/2017, 11:56 AM

## 2017-06-04 NOTE — Progress Notes (Signed)
Occupational Therapy Session Note  Patient Details  Name: Nicole [name] MRN: [id number] Date of Birth: November 12, 1928  Today's Date: 06/04/2017 OT Individual Time: 1500-1530 OT Individual Time Calculation (min): 30 min    Short Term Goals: Week 1:  OT Short Term Goal 1 (Week 1): Pt will complete shower transfer with min A  OT Short Term Goal 2 (Week 1): Pt will completed grooming in standing with CGA OT Short Term Goal 3 (Week 1): Pt will complete toileting task with CGA OT Short Term Goal 4 (Week 1): Pt will complete LB dressing with CGA    Skilled Therapeutic Interventions/Progress Updates:    Pt transitioned easily from SLP session. [facility] to limit distractions. Pt engaged in dynavision task in standing for 1 minute with focus on forward weight shift. Pt standing with max A from wheelchair and standing with posterior lean. Pt able to hit 2 targets within 1 minute while standing. Pt taking seated rest break and then refusing to stand. Pt engaged in dynavision from seated position with focus on R inattention with targets only in bottom right quadrant. Pt required mod multimodal cues to locate and then initiate hitting targets. Pt able to hit 9 targets within 4 minutes. OT assisted pt back to room via wheelchair. Family present in room and quick release belt donned for safety.   Therapy Documentation Precautions:  Precautions Precautions: Fall Precaution Comments: Confusion Restrictions Weight Bearing Restrictions: No General:   Vital Signs: Therapy Vitals Temp: 98.1 F (36.7 C) Temp Source: Oral Pulse Rate: (!) 50 Resp: 16 BP: (!) 143/31 Patient Position (if appropriate): Lying Oxygen Therapy SpO2: 95 % O2 Device: Not Delivered Pain: Pain Assessment Pain Assessment: No/denies pain ADL: ADL Eating: Set up Where Assessed-Eating: Wheelchair Grooming: Minimal assistance Where Assessed-Grooming: Standing at sink Upper Body Bathing:  Minimal assistance Where Assessed-Upper Body Bathing: Shower Lower Body Bathing: Minimal assistance Where Assessed-Lower Body Bathing: Shower Upper Body Dressing: Setup Where Assessed-Upper Body Dressing: Wheelchair Lower Body Dressing: Moderate assistance Where Assessed-Lower Body Dressing: Wheelchair Toileting: Minimal assistance Where Assessed-Toileting: Teacher, adult educationToilet Toilet Transfer: Moderate assistance Toilet Transfer Method: Stand pivot Toilet Transfer Equipment: AcupuncturistGrab bars Walk-In Shower Transfer: Moderate assistance Film/video editorWalk-In Shower Transfer Method: Warden/rangertand pivot Walk-In Shower Equipment: Shower seat with back, Grab bars(from w/c) ADL Comments: cues for thoroughness Vision   Perception    Praxis   Exercises:   Other Treatments:    See Function Navigator for Current Functional Status.   Therapy/Group: Individual Therapy  Alen BleacherBradsher, Balraj Brayfield P 06/04/2017, 4:57 PM

## 2017-06-04 NOTE — Progress Notes (Signed)
Pt refusing sorbitol. No charted bm since 05/30/17. Pt reports having " many bowel movements" Will continue to encourage stool softners.

## 2017-06-05 ENCOUNTER — Inpatient Hospital Stay (HOSPITAL_COMMUNITY): Payer: Medicare Other | Admitting: Speech Pathology

## 2017-06-05 ENCOUNTER — Inpatient Hospital Stay (HOSPITAL_COMMUNITY): Payer: Medicare Other

## 2017-06-05 ENCOUNTER — Inpatient Hospital Stay (HOSPITAL_COMMUNITY): Payer: Medicare Other | Admitting: Physical Therapy

## 2017-06-05 DIAGNOSIS — R7989 Other specified abnormal findings of blood chemistry: Secondary | ICD-10-CM

## 2017-06-05 DIAGNOSIS — A499 Bacterial infection, unspecified: Secondary | ICD-10-CM

## 2017-06-05 DIAGNOSIS — N390 Urinary tract infection, site not specified: Secondary | ICD-10-CM

## 2017-06-05 LAB — URINE CULTURE: Culture: >=100000 — AB

## 2017-06-05 LAB — CBC
HEMATOCRIT: 35 % — AB (ref 36.0–46.0)
HEMOGLOBIN: 10.7 g/dL — AB (ref 12.0–15.0)
MCH: 27.4 pg (ref 26.0–34.0)
MCHC: 30.6 g/dL (ref 30.0–36.0)
MCV: 89.7 fL (ref 78.0–100.0)
Platelets: 273 10*3/uL (ref 150–400)
RBC: 3.9 MIL/uL (ref 3.87–5.11)
RDW: 15.1 % (ref 11.5–15.5)
WBC: 8.9 10*3/uL (ref 4.0–10.5)

## 2017-06-05 LAB — BASIC METABOLIC PANEL
ANION GAP: 7 (ref 5–15)
BUN: 28 mg/dL — ABNORMAL HIGH (ref 6–20)
CALCIUM: 8.8 mg/dL — AB (ref 8.9–10.3)
CHLORIDE: 105 mmol/L (ref 101–111)
CO2: 27 mmol/L (ref 22–32)
Creatinine, Ser: 1.01 mg/dL — ABNORMAL HIGH (ref 0.44–1.00)
GFR calc Af Amer: 57 mL/min — ABNORMAL LOW (ref >60)
GFR calc non Af Amer: 49 mL/min — ABNORMAL LOW (ref >60)
GLUCOSE: 114 mg/dL — AB (ref 65–99)
Potassium: 3.3 mmol/L — ABNORMAL LOW (ref 3.5–5.1)
Sodium: 139 mmol/L (ref 135–145)

## 2017-06-05 LAB — PREALBUMIN: Prealbumin: 18.5 mg/dL (ref 18–38)

## 2017-06-05 MED ORDER — POTASSIUM CHLORIDE IN NACL 20-0.45 MEQ/L-% IV SOLN
INTRAVENOUS | Status: DC
  Administered 2017-06-05 – 2017-06-06 (×3): via INTRAVENOUS
  Filled 2017-06-05 (×3): qty 1000

## 2017-06-05 NOTE — Progress Notes (Signed)
[facility] PHYSICAL MEDICINE & REHABILITATION     PROGRESS NOTE  Subjective/Complaints:  Pt still demonstrating limited participation in therapies. Lethargic at times  ROS: Limited due to cognitive/behavioral    Objective: Vital Signs: Blood pressure (!) 149/31, pulse (!) 52, temperature 98.4 F (36.9 C), temperature source Oral, resp. rate 16, height 5\' 8"  (1.727 m), weight 62.2 kg (137 lb 2 oz), SpO2 99 %. No results found. Recent Labs    06/05/17 0531  WBC 8.9  HGB 10.7*  HCT 35.0*  PLT 273   Recent Labs    06/02/17 2347 06/05/17 0531  NA  --  139  K  --  3.3*  CL  --  105  GLUCOSE  --  114*  BUN  --  28*  CREATININE 0.99 1.01*  CALCIUM  --  8.8*   CBG (last 3)  No results for input(s): GLUCAP in the last 72 hours.  Wt Readings from Last 3 Encounters:  06/04/17 62.2 kg (137 lb 2 oz)  05/27/17 64.6 kg (142 lb 6.7 oz)    Physical Exam:  BP (!) 149/31 (BP Location: Right Arm)   Pulse (!) 52   Temp 98.4 F (36.9 C) (Oral)   Resp 16   Ht 5\' 8"  (1.727 m)   Wt 62.2 kg (137 lb 2 oz)   SpO2 99%   BMI 20.85 kg/m  Constitutional: She appears well-developed. Frail  HENT: Normocephalic and atraumatic.  Eyes: EOM are normal. No discharge.  Cardiovascular: bradycardia Respiratory: CTA Bilaterally without wheezes or rales. Normal effort .  GI: Bowel sounds are normal. Nondistended.. Musculoskeletal: She exhibits no edema or tenderness.  Neurological: A&Ox1. Does not engage Expressive language deficits persistent She was able to follow simple commands  Motor: 4+/5 in Bilateral delt, bi, tri, grip (stable) LLE: 4/5 proximal to distal  RLE: 4/5 proximal to distal   Skin: Skin is warm and dry.  Psychiatric: Her affect is blunt. Her speech is delayed. She is slowed. Cognition and memory are impaired.    Assessment/Plan: 1. Functional deficits secondary to left PCA infarction which require 3+ hours per day of interdisciplinary therapy in a comprehensive inpatient  rehab setting. Physiatrist is providing close team supervision and 24 hour management of active medical problems listed below. Physiatrist and rehab team continue to assess barriers to discharge/monitor patient progress toward functional and medical goals.  Function:  Bathing Bathing position   Position: Shower  Bathing parts Body parts bathed by patient: Right arm, Left upper leg, Left arm, Chest, Right upper leg, Abdomen, Front perineal area, Buttocks, Right lower leg, Left lower leg Body parts bathed by helper: Back  Bathing assist Assist Level: Touching or steadying assistance(Pt > 75%)      Upper Body Dressing/Undressing Upper body dressing   What is the patient wearing?: Pull over shirt/dress     Pull over shirt/dress - Perfomed by patient: Thread/unthread right sleeve, Thread/unthread left sleeve, Put head through opening, Pull shirt over trunk Pull over shirt/dress - Perfomed by helper: Thread/unthread right sleeve, Thread/unthread left sleeve, Put head through opening        Upper body assist Assist Level: Supervision or verbal cues      Lower Body Dressing/Undressing Lower body dressing   What is the patient wearing?: Pants   Underwear - Performed by helper: Thread/unthread right underwear leg, Thread/unthread left underwear leg, Pull underwear up/down Pants- Performed by patient: Thread/unthread right pants leg, Thread/unthread left pants leg, Pull pants up/down Pants- Performed by helper: Pull  pants up/down Non-skid slipper socks- Performed by patient: Don/doff right sock, Don/doff left sock                    Lower body assist Assist for lower body dressing: Touching or steadying assistance (Pt > 75%)      Toileting Toileting   Toileting steps completed by patient: Adjust clothing prior to toileting, Performs perineal hygiene, Adjust clothing after toileting Toileting steps completed by helper: Adjust clothing after toileting Toileting Assistive  Devices: Grab bar or rail  Toileting assist Assist level: Touching or steadying assistance (Pt.75%)   Transfers Chair/bed transfer   Chair/bed transfer method: Stand pivot Chair/bed transfer assist level: Maximal assist (Pt 25 - 49%/lift and lower) Chair/bed transfer assistive device: Armrests, Patent attorneyWalker     Locomotion Ambulation     Max distance: 25 Assist level: Moderate assist (Pt 50 - 74%)   Wheelchair   Type: Manual Max wheelchair distance: 15 Assist Level: Supervision or verbal cues  Cognition Comprehension Comprehension assist level: Follows basic conversation/direction with extra time/assistive device  Expression Expression assist level: Expresses basic 75 - 89% of the time/requires cueing 10 - 24% of the time. Needs helper to occlude trach/needs to repeat words.  Social Interaction Social Interaction assist level: Interacts appropriately 75 - 89% of the time - Needs redirection for appropriate language or to initiate interaction.  Problem Solving Problem solving assist level: Solves basic 25 - 49% of the time - needs direction more than half the time to initiate, plan or complete simple activities  Memory Memory assist level: Recognizes or recalls 25 - 49% of the time/requires cueing 50 - 75% of the time    Medical Problem List and Plan: 1.  Aphasia with gait and balance disorder secondary to left PCA infarction   Cont CIR---encourage and motivate as possible with therapies 2.  DVT Prophylaxis/Anticoagulation: Subcutaneous Lovenox 3. Pain Management: Hydrocodone as needed 4. Mood/memory loss: Provide emotional support. Provide tele sitter for safety   Fluoxetine started on 12/31-  5. Neuropsych: This patient is not capable of making decisions on her own behalf. 6. Skin/Wound Care: Routine skin checks 7. Fluids/Electrolytes/Nutrition: Routine I&O's    BMP within acceptable range on 12/27    Remeron 7.5 started on 12/31   Encouraging po intake.---check prealbumin with next  labs 8. Hypertension: Lisinopril 40 mg daily   Hydralazine 50 mg every 8 hours, increased to 75 on 12/31   Norvasc increased to 10 mg on 12/30    Overall improved control.  9. Bradycardia. Attempts at placement of pacemaker 05/21/2017 unsuccessful. Plan to continue to monitor. 10. Constipation. Laxative assistance 11. Hyperlipidemia. Pravachol 12. Prediabetes   Relatively controlled on 12/27 13. Hypoalbuminemia   Supplement initiated on 12/27 14. Acute blood loss anemia   Hemoglobin 11.2 on 12/27   Continue to monitor 15. AKI    BUN/Cr up to 28/1.01 today   Encourage fluids, egin IVF as below 16. Bradycardia   Monitor for symptoms with increased physical activity   ECG reviewed, suggesting brady, similar to previous.  Discussed with Cards for possible further recs, awaiting further recs 17. Confusion/lethargy   Multifactorial   UCX + for enterococcus faecalis--- macrodantin initiated (sens pending)    -BUN trending up---begin IVF   Trazodone stopped    -hold remeron for now, contine fluoxetine  LOS (Days) 9 A FACE TO FACE EVALUATION WAS PERFORMED  Shalom Ware T 06/05/2017 8:50 AM

## 2017-06-05 NOTE — Progress Notes (Signed)
Physical Therapy Note  Patient Details  Name: Nicole [name] MRN: [id number] Date of Birth: January 18, 1929 Today's Date: 06/05/2017    TIme: 1000  PT attempted to arouse pt to engage in PT session. Pt making moaning noises but unwilling/unable to open eyes.  RN present and checks pt's vital signs without pt waking or making any noises.  Unable to arouse pt to participate in skilled PT session. Pt missed 60 minutes   DONAWERTH,KAREN 06/05/2017, 11:00 AM

## 2017-06-05 NOTE — Progress Notes (Addendum)
Occupational Therapy Session Note  Patient Details  Name: Nicole [name] MRN: [id number] Date of Birth: [birth year]  Today's Date: 06/05/2017 OT Missed Time: 75 Minutes Missed Time Reason: Patient unwilling/refused to participate without medical reason;Patient fatigue  Upon entering room, pt was positioned in bed asleep. Pt easily aroused, however despite opportunities for in bed, OOB, EOB activities, pt declines. Pt states, "too cold." OT attempts to encourage pt to participate, however pt closes eyes and falls asleep.    Attempted to follow up 45 min later to pick up missed time. Pt sleeping unable to be aroused/unwilling to open eyes. Pt visibly breathing and sighing in response to OT multimodal cues and questions.  Elenore PaddyStephanie M Nioma Mccubbins 06/05/2017, 2:29 PM

## 2017-06-05 NOTE — Progress Notes (Signed)
Physical Therapy Weekly Progress Note  Patient Details  Name: Nicole [name] MRN: [id number] Date of Birth: 10/19/1928  Beginning of progress report period: May 28, 2017 End of progress report period: June 05, 2017  Patient has met 1 of 2 short term goals.  Pt with recent decline in cognitive functioning limiting ability to participate in PT sessions. Pt is able to perform gait with min A with RW, requires max A for balance and transfers without device.  Patient continues to demonstrate the following deficits muscle weakness, decreased attention, decreased awareness, decreased problem solving, decreased safety awareness, decreased memory and delayed processing and decreased sitting balance, decreased standing balance, decreased postural control and decreased balance strategies and therefore will continue to benefit from skilled PT intervention to increase functional independence with mobility.  Patient not progressing toward long term goals.  See goal revision..  Continue plan of care.  PT Short Term Goals Week 1:  PT Short Term Goal 1 (Week 1): pt will perform gait x 50' with min A in controlled environment PT Short Term Goal 1 - Progress (Week 1): Met PT Short Term Goal 2 (Week 1): pt will demo standign balance for functional task with min A PT Short Term Goal 2 - Progress (Week 1): Progressing toward goal Week 2:  PT Short Term Goal 1 (Week 2): =LTG  Skilled Therapeutic Interventions/Progress Updates:  Ambulation/gait training;Pain management;Stair training;Balance/vestibular training;DME/adaptive equipment instruction;Patient/family education;Therapeutic Activities;Wheelchair propulsion/positioning;Cognitive remediation/compensation;Therapeutic Exercise;Community reintegration;Functional mobility training;UE/LE Strength taining/ROM;Discharge planning;Neuromuscular re-education;Splinting/orthotics;UE/LE Coordination activities     See Function Navigator for Current Functional  Status.  , 06/05/2017, 8:45 AM  

## 2017-06-05 NOTE — Progress Notes (Signed)
Speech Language Pathology Daily Session Note  Patient Details  Name: Nicole [name] MRN: [id number] Date of Birth: [birth year]  Today's Date: 06/05/2017 SLP Individual Time: 1100-1130; [id number] SLP Individual Time Calculation (min): 30 min; 20 min   Short Term Goals: Week 2: SLP Short Term Goal 1 (Week 2): STG=LTG due to remaining length of stay   Skilled Therapeutic Interventions:  Session 1:  Pt was seen for skilled ST targeting cognitive goals.  Pt was asleep upon arrival, requiring max encouragement to awaken and get out of bed.  Pt was transferred to wheelchair to maximize alertness for participation in therapies.  Pt brushed her hair and washed her face at the sink with set up assist.  Pt stated that she was freezing throughout therapy session.  Therapist offered pt a hot beverage which pt accepted.  She declined making her own drink but could direct therapist how many creams and sugars to add with max assist question cues.  Minimal participation this AM secondary to fatigue.  Pt handed off to OT at the end of today's therapy session.  Will continue efforts this afternoon.   Session 2:  Pt was seen for skilled ST targeting cognitive goals.  Pt was asleep upon arrival but more easily awakened and agreeable to participating in bedside therapies with mod encouragement.  Pt repositioned herself in bed to maximize alertness with min assist.  She required max assist to record 3 very general, basic events into her memory journal.  As session progressed, pt became increasingly difficulty to engage due to lethargy.  Pt was left in bed with bed alarm set and call bell within reach.  Continue per current plan of care.     Function:  Eating Eating                 Cognition Comprehension Comprehension assist level: Understands basic 90% of the time/cues < 10% of the time  Expression   Expression assist level: Expresses basic 75 - 89% of the time/requires cueing 10 - 24% of the time. Needs  helper to occlude trach/needs to repeat words.  Social Interaction Social Interaction assist level: Interacts appropriately 25 - 49% of time - Needs frequent redirection.  Problem Solving Problem solving assist level: Solves basic 25 - 49% of the time - needs direction more than half the time to initiate, plan or complete simple activities  Memory Memory assist level: Recognizes or recalls 25 - 49% of the time/requires cueing 50 - 75% of the time    Pain Pain Assessment Pain Assessment: No/denies pain  Therapy/Group: Individual Therapy  Emilly Lavey, Melanee SpryNicole L 06/05/2017, 2:42 PM

## 2017-06-05 NOTE — Progress Notes (Signed)
Occupational Therapy Session Note  Patient Details  Name: Nicole [name] MRN: [id number] Date of Birth: 21-Jun-1928  Today's Date: 06/05/2017 OT Individual Time: [id number] OT Individual Time Calculation (min): 17 min  and Today's Date: 06/05/2017 OT Missed Time: 13 Minutes Missed Time Reason: Patient unwilling/refused to participate without medical reason;Patient fatigue   Short Term Goals: Week 1:  OT Short Term Goal 1 (Week 1): Pt will complete shower transfer with min A  OT Short Term Goal 2 (Week 1): Pt will completed grooming in standing with CGA OT Short Term Goal 3 (Week 1): Pt will complete toileting task with CGA OT Short Term Goal 4 (Week 1): Pt will complete LB dressing with CGA   Skilled Therapeutic Interventions/Progress Updates:    1;1. Pt seated in w/c with hand over face and SLP exits room. Pt oriented to hospital, however not specific hospital and refuses to open eyes to look for external cues. Pt reporting fatigue, being "cold," but no pain. OT provides pt with fuzzy slippers to don, however pt with waning arousal and does not initiate donning warm shoes. Pt able to kick out one foot at a time with cueing as OT dons slippers. Pt unfolds blanket with cueing and mod A using RUE only and OT places around shoulders. Pt takes sips of hot tea, however continues to decline any activity/activity. OT exits session as pt does with decreased arousal and not willing to participate missing 13 min of skilled OT.   Therapy Documentation Precautions:  Precautions Precautions: Fall Precaution Comments: Confusion Restrictions Weight Bearing Restrictions: No General: General PT Missed Treatment Reason: Patient fatigue;Patient ill (Comment) Vital Signs: Therapy Vitals Pulse Rate: (!) 48 BP: (!) 175/34  See Function Navigator for Current Functional Status.   Therapy/Group: Individual Therapy  Shon HaleStephanie M Catheryn Slifer 06/05/2017, 11:58 AM

## 2017-06-06 ENCOUNTER — Inpatient Hospital Stay (HOSPITAL_COMMUNITY): Payer: Medicare Other | Admitting: Physical Therapy

## 2017-06-06 DIAGNOSIS — I63532 Cerebral infarction due to unspecified occlusion or stenosis of left posterior cerebral artery: Secondary | ICD-10-CM

## 2017-06-06 LAB — BASIC METABOLIC PANEL
Anion gap: 9 (ref 5–15)
BUN: 16 mg/dL (ref 6–20)
CALCIUM: 8.9 mg/dL (ref 8.9–10.3)
CO2: 26 mmol/L (ref 22–32)
CREATININE: 0.8 mg/dL (ref 0.44–1.00)
Chloride: 102 mmol/L (ref 101–111)
GFR calc Af Amer: >60 mL/min (ref >60)
GFR calc non Af Amer: >60 mL/min (ref >60)
GLUCOSE: 120 mg/dL — AB (ref 65–99)
Potassium: 3.6 mmol/L (ref 3.5–5.1)
Sodium: 137 mmol/L (ref 135–145)

## 2017-06-06 NOTE — Discharge Summary (Signed)
[facility] Discharge Summary   Patient: Nicole [name] ZOX:[id number]   PCP: System, Pcp Not In DOB: 1929-01-22   Date of admission: 05/20/2017   Date of discharge: 05/27/2017   Discharge Diagnoses:  Principal Problem:   Heart block Active Problems:   Hypertensive urgency   Memory deficit   Acute encephalopathy   Mobitz type 2 second degree heart block   Cerebral thrombosis with cerebral infarction   Pacemaker   Hyperlipidemia   Slow transit constipation   Admitted From: home Disposition:  CIR  Recommendations for Outpatient Follow-up:  1. Please follow-up with PCP in 1 week 2. Please follow-up with cardiology Dr. Ladona Ridgel as recommended  Follow-up Information    Nilda Riggs, NP. Schedule an appointment as soon as possible for a visit in 6 week(s).   Specialty:  Family Medicine Contact information: [address] [place] [address] [phone]        PCP. Schedule an appointment as soon as possible for a visit in 1 week(s).        Marinus Maw, MD. Schedule an appointment as soon as possible for a visit in 1 month(s).   Specialty:  Cardiology Contact information: [address] [place] [address] [phone]          Diet recommendation: Cardiac diet  Activity: The patient is advised to gradually reintroduce usual activities.  Discharge Condition: good  Code Status: Full code  History of present illness: As per the H and P dictated on admission, " Nicole [name] is a 84 y.o. female with medical history significant for hypertension, now presenting to the emergency department for evaluation of confusion.  History was obtained through family, discussion with ED personnel, and review of the EMR.  She has reportedly been having some difficulties with confusion for a year that has not previously been evaluated, but her son reports that this has worsened significantly in the past couple days.  Patient is  not expressing any specific complaints.  There was no recent fall or trauma and she is not known to use illicit substances or alcohol.  She has not had a PCP and her medications ran out in March 2018.  [facility] ED Course: Upon arrival to the ED, patient is found to be afebrile, saturating well on room air, bradycardic in the 40s, and hypertensive with systolic pressures in the 200s.  EKG features a bradycardia with rate 42 and apparent Mobitz type II.  Noncontrast head CT is negative for acute intracranial abnormality.  Chemistry panel and CBC are unremarkable and troponin is undetectable.  Urine is not particularly suggestive of infection and sample was sent for culture.  Patient was treated with Norvasc and lisinopril in the emergency department and given prn doses of hydralazine.  Blood pressure improved, heart rate remained in the 40s, and there has not been any apparent respiratory distress.  Cardiology was consulted by the ED physician and recommended medical admission to [facility] for possible pacemaker.  Will be admitted to the stepdown unit and [facility] for ongoing evaluation and management of heart block, hypertensive urgency, and confusion.   "  Hospital Course:  Summary of her active problems in the hospital is as following. 1-Bradycardia, Mobitz type 2 block.  TSH normal.  ECHO EF 60 % diastolic dysfunction grade 2.  Mg normal.  Cardiology consulted. Patient was not able to tolerates pacemake implantation 12-20. She will need general anesthesia.  Per Dr Ladona Ridgel hold  on pacemaker now, he would like to evaluate the patient as an outpatient, felt that the patient does not require acute pacemaker implant right now. Continues to have on and off heart block.  Asymptomatic though.  2-Acute thalamic stroke.  Acute encephalopathy, likely hypertensive MRI positive for thalamic stroke.  Neurology consulted. Started on aspirin and statins.  B 12 normal. LDL 142.    -Mental status improved this morning, alert , answering some questions, but still not at her baseline, son at bedside. -EEG shows metabolic encephalopathy without any active seizures or focus.  3-Hypertension with hypertensive urgency Presented with significantly high blood pressure on admission. Found to have acute CVA therefore was required to have permissive hypertension. Now the patient is out of the window.  For permissive hypertension and therefore will start lowering the blood pressure. Started on IV as needed hydralazine, oral hydralazine 25 mg which has been increased to 50 mg 3 times daily. Patient was already on lisinopril 20 mg which I will increase to 40 mg daily. Added Norvasc, was on 10 mg with rapid correction, will just start with 2.5 mg daily. Monitor.  4-Urine retention;  Resolved right now.  5.  Hypokalemia. Replaced  6.  Insomnia. Add Ambien.  All other chronic medical condition were stable during the hospitalization.  Patient was seen by physical therapy, who recommended CIR, which was arranged by Child psychotherapist and case Production designer, theatre/television/film. On the day of the discharge the patient's vitals were stable, and no other acute medical condition were reported by patient. the patient was felt safe to be discharge at [facility] with therapy.  Procedures and Results:  Echocardiogram  Consultations:  Cardiology  Neurology  DISCHARGE MEDICATION: Allergies as of 05/27/2017   No Known Allergies     Medication List    STOP taking these medications   losartan 100 MG tablet Commonly known as:  COZAAR     TAKE these medications   amLODipine 2.5 MG tablet Commonly known as:  NORVASC Take 1 tablet (2.5 mg total) by mouth daily.   aspirin 81 MG chewable tablet Chew 1 tablet (81 mg total) by mouth daily.   hydrALAZINE 50 MG tablet Commonly known as:  APRESOLINE Take 1 tablet (50 mg total) by mouth every 8 (eight) hours.   lisinopril 40 MG tablet Commonly known as:   PRINIVIL,ZESTRIL Take 1 tablet (40 mg total) by mouth daily.   pravastatin 40 MG tablet Commonly known as:  PRAVACHOL Take 1 tablet (40 mg total) by mouth daily at 6 PM.   zolpidem 5 MG tablet Commonly known as:  AMBIEN Take 1 tablet (5 mg total) by mouth at bedtime as needed for sleep.      No Known Allergies Discharge Instructions    Ambulatory referral to Neurology   Complete by:  As directed    Follow up with stroke clinic Darrol Angel preferred, if not available, then consider Sylvie Farrier, [facility] or Lucia Gaskins whoever is available) at [facility] in about 6-8 weeks. Thanks.   Diet - low sodium heart healthy   Complete by:  As directed    Discharge instructions   Complete by:  As directed    It is important that you read following instructions as well as go over your medication list with RN to help you understand your care after this hospitalization.  Discharge Instructions: Please follow-up with PCP in one week  Please request your primary care physician to go over all Hospital Tests and Procedure/Radiological results at the follow up,  Please get all Hospital records sent to your PCP by signing hospital release before you go home.   Do not drive, operating heavy machinery, perform activities at heights, swimming or participation in water activities or provide baby sitting services; until you have been seen by Primary Care Physician or a Neurologist and advised to do so again. Do not take more than prescribed Pain, Sleep and Anxiety Medications. You were cared for by a hospitalist during your hospital stay. If you have any questions about your discharge medications or the care you received while you were in the hospital after you are discharged, you can call the unit and ask to speak with the hospitalist on call if the hospitalist that took care of you is not available.  Once you are discharged, your primary care physician will handle any further medical issues. Please note that NO  REFILLS for any discharge medications will be authorized once you are discharged, as it is imperative that you return to your primary care physician (or establish a relationship with a primary care physician if you do not have one) for your aftercare needs so that they can reassess your need for medications and monitor your lab values. You Must read complete instructions/literature along with all the possible adverse reactions/side effects for all the Medicines you take and that have been prescribed to you. Take any new Medicines after you have completely understood and accept all the possible adverse reactions/side effects. Wear Seat belts while driving.   Increase activity slowly   Complete by:  As directed      Discharge Exam: Filed Weights   05/25/17 0424 05/26/17 0438 05/27/17 0518  Weight: 62.2 kg (137 lb 2 oz) 65.1 kg (143 lb 8.3 oz) 64.6 kg (142 lb 6.7 oz)   Vitals:   05/27/17 1100 05/27/17 1302  BP: (!) 184/51 (!) 179/55  Pulse: (!) 49   Resp: 15 (!) 24  Temp: 98.5 F (36.9 C)   SpO2: 93%    General: Appear in no distress, no Rash; Oral Mucosa moist. Cardiovascular: S1 and S2 Present, no Murmur, no JVD Respiratory: Bilateral Air entry present and Clear to Auscultation, no Crackles, no wheezes Abdomen: Bowel Sound present, Soft and no tenderness Extremities: no Pedal edema, no calf tenderness Neurology: Grossly no focal neuro deficit.  The results of significant diagnostics from this hospitalization (including imaging, microbiology, ancillary and laboratory) are listed below for reference.    Significant Diagnostic Studies: Ct Head Wo Contrast  Result Date: 05/22/2017 CLINICAL DATA:  Stroke, follow-up, change in mental status today. EXAM: CT HEAD WITHOUT CONTRAST TECHNIQUE: Contiguous axial images were obtained from the base of the skull through the vertex without intravenous contrast. COMPARISON:  Head CT dated 05/20/2017.  Brain MRI dated 05/21/2017. FINDINGS: Brain: Again  noted is generalized parenchymal atrophy with commensurate dilatation of the ventricles and sulci, stable. Chronic small vessel ischemic changes are again seen throughout the bilateral periventricular and subcortical white matter regions, stable. Low-density focus in the left thalamus is compatible with focal edema related to the acute infarct demonstrated on yesterday's brain MRI, stable in size. There are no new parenchymal findings. No parenchymal or extra-axial hemorrhage. Vascular: There are chronic calcified atherosclerotic changes of the large vessels at the skull base. No unexpected hyperdense vessel. Skull: Normal. Negative for fracture or focal lesion. Sinuses/Orbits: No acute finding. Other: None. IMPRESSION: No new intracranial abnormality. No intracranial hemorrhage. No mass effect, midline shift or herniation. Small focus of edema within the left thalamus, corresponding  to the acute lacunar infarct described on yesterday's MRI. Chronic small vessel ischemic changes in the white matter. Electronically Signed   By: Bary Richard M.D.   On: 05/22/2017 16:47   Ct Head Wo Contrast  Result Date: 05/20/2017 CLINICAL DATA:  84 year old female with memory loss symptoms for the past few months, new onset short-term memory changes in the past 4 days. EXAM: CT HEAD WITHOUT CONTRAST TECHNIQUE: Contiguous axial images were obtained from the base of the skull through the vertex without intravenous contrast. COMPARISON:  [facility] head [facility] without contrast 08/26/2016 FINDINGS: Brain: Stable cerebral volume since March. Ex vacuo appearing mild for age ventricular prominence. No evidence of transependymal edema. No midline shift, mass effect, or evidence of intracranial mass lesion. No acute intracranial hemorrhage identified. Patchy and confluent mostly frontal horn region periventricular white matter hypodensity appears stable. No cortical encephalomalacia identified. No cortically based acute  infarct identified. Vascular: Calcified atherosclerosis at the skull base. No suspicious intracranial vascular hyperdensity. Skull: Stable.  No acute osseous abnormality identified. Sinuses/Orbits: Visualized paranasal sinuses and mastoids are stable and well pneumatized. Other: Stable and negative visualized orbit and scalp soft tissues. IMPRESSION: No acute intracranial abnormality and stable non contrast CT appearance of the brain since March 2018. Cerebral volume loss with ex vacuo appearing ventricular enlargement, and nonspecific frontal horn region chronic white matter changes. Electronically Signed   By: Odessa Fleming M.D.   On: 05/20/2017 11:56   Mr Shirlee Latch ZO Contrast  Result Date: 05/21/2017 CLINICAL DATA:  84 year old female with recent unexplained confusion. EXAM: MRI HEAD WITHOUT CONTRAST MRA HEAD WITHOUT CONTRAST TECHNIQUE: Multiplanar, multiecho pulse sequences of the brain and surrounding structures were obtained without intravenous contrast. Angiographic images of the head were obtained using MRA technique without contrast. COMPARISON:  Head CT without contrast 05/20/2017, 08/26/2016. FINDINGS: MRI HEAD FINDINGS Brain: There is a small 8-9 mm area of nodular restricted diffusion along the anterior left thalamus near the genu of the left internal capsule (series 3, image 24). There is associated T2 and FLAIR hyperintensity. No associated hemorrhage. No significant mass effect. No other restricted diffusion. Moderate bilateral superimposed deep gray matter nuclei T2 heterogeneity, although perhaps in large part due to prominent perivascular spaces. Confluent bilateral cerebral white matter T2 and FLAIR hyperintensity. No cortical encephalomalacia identified. No definite chronic cerebral blood products. The brainstem and cerebellum are normal aside from volume loss. No midline shift, mass effect, evidence of mass lesion, ventriculomegaly, extra-axial collection or acute intracranial hemorrhage.  Cervicomedullary junction and pituitary are within normal limits. Vascular: Major intracranial vascular flow voids are preserved. Skull and upper cervical spine: Negative visualized cervical spine. Visualized bone marrow signal is within normal limits. Sinuses/Orbits: Postoperative changes to both globes. Otherwise negative orbits soft tissues. Paranasal sinuses and mastoids are stable and well pneumatized. Other: Visible internal auditory structures appear normal. Scalp and face soft tissues appear negative. MRA HEAD FINDINGS Antegrade flow in the posterior circulation. Dominant appearing distal right vertebral artery. Patent left PICA origin. Patent vertebrobasilar junction and basilar artery without stenosis. Patent dominant appearing right AICA origin. Patent SCA and PCA origins. Posterior communicating arteries are diminutive or absent. There is mild bilateral P1 segment irregularity, but there is severe stenosis of the left PCA P2 segment affecting a 3-4 mm portion of the vessel (series 1153 image 6). Fairly symmetric appearing distal left PCA flow signal despite this lesion. Distal right PCA branches are within normal limits. Antegrade flow in both ICA siphons.  Moderate siphon irregularity greater on the left. But only mild multifocal left siphon stenosis (pre cavernous segment and supraclinoid segment). No right siphon stenosis. Patent carotid termini. Normal ophthalmic artery origins. Normal MCA and ACA origins. Tortuous A1 segments. Anterior communicating artery and proximal A2 segments are normal. There is moderate to severe bilateral distal ACA A2 segment stenosis with preserved distal flow (series 1155, image 3). Both MCA M1 segments and MCA bifurcations are patent without stenosis. Visible right MCA branches are within normal limits. Visible left MCA branches demonstrate mild irregularity. IMPRESSION: 1. Acute lacunar infarct of the anterior Left Thalamus. No hemorrhage or mass effect. 2. Associated  severe Left PCA stenosis, perhaps with acute involvement of the Left Thalamostriate Artery in light of #1. 3. Intracranial MRA also remarkable for moderate to severe bilateral ACA A2 segment stenosis. 4. No other acute intracranial abnormality. Other deep gray matter nuclei signal changes and widespread cerebral white matter signal changes most commonly due to chronic small vessel disease. Electronically Signed   By: Odessa Fleming M.D.   On: 05/21/2017 16:34   Mr Brain Wo Contrast  Result Date: 05/21/2017 CLINICAL DATA:  84 year old female with recent unexplained confusion. EXAM: MRI HEAD WITHOUT CONTRAST MRA HEAD WITHOUT CONTRAST TECHNIQUE: Multiplanar, multiecho pulse sequences of the brain and surrounding structures were obtained without intravenous contrast. Angiographic images of the head were obtained using MRA technique without contrast. COMPARISON:  Head CT without contrast 05/20/2017, 08/26/2016. FINDINGS: MRI HEAD FINDINGS Brain: There is a small 8-9 mm area of nodular restricted diffusion along the anterior left thalamus near the genu of the left internal capsule (series 3, image 24). There is associated T2 and FLAIR hyperintensity. No associated hemorrhage. No significant mass effect. No other restricted diffusion. Moderate bilateral superimposed deep gray matter nuclei T2 heterogeneity, although perhaps in large part due to prominent perivascular spaces. Confluent bilateral cerebral white matter T2 and FLAIR hyperintensity. No cortical encephalomalacia identified. No definite chronic cerebral blood products. The brainstem and cerebellum are normal aside from volume loss. No midline shift, mass effect, evidence of mass lesion, ventriculomegaly, extra-axial collection or acute intracranial hemorrhage. Cervicomedullary junction and pituitary are within normal limits. Vascular: Major intracranial vascular flow voids are preserved. Skull and upper cervical spine: Negative visualized cervical spine. Visualized  bone marrow signal is within normal limits. Sinuses/Orbits: Postoperative changes to both globes. Otherwise negative orbits soft tissues. Paranasal sinuses and mastoids are stable and well pneumatized. Other: Visible internal auditory structures appear normal. Scalp and face soft tissues appear negative. MRA HEAD FINDINGS Antegrade flow in the posterior circulation. Dominant appearing distal right vertebral artery. Patent left PICA origin. Patent vertebrobasilar junction and basilar artery without stenosis. Patent dominant appearing right AICA origin. Patent SCA and PCA origins. Posterior communicating arteries are diminutive or absent. There is mild bilateral P1 segment irregularity, but there is severe stenosis of the left PCA P2 segment affecting a 3-4 mm portion of the vessel (series 1153 image 6). Fairly symmetric appearing distal left PCA flow signal despite this lesion. Distal right PCA branches are within normal limits. Antegrade flow in both ICA siphons. Moderate siphon irregularity greater on the left. But only mild multifocal left siphon stenosis (pre cavernous segment and supraclinoid segment). No right siphon stenosis. Patent carotid termini. Normal ophthalmic artery origins. Normal MCA and ACA origins. Tortuous A1 segments. Anterior communicating artery and proximal A2 segments are normal. There is moderate to severe bilateral distal ACA A2 segment stenosis with preserved distal flow (series 1155, image 3).  Both MCA M1 segments and MCA bifurcations are patent without stenosis. Visible right MCA branches are within normal limits. Visible left MCA branches demonstrate mild irregularity. IMPRESSION: 1. Acute lacunar infarct of the anterior Left Thalamus. No hemorrhage or mass effect. 2. Associated severe Left PCA stenosis, perhaps with acute involvement of the Left Thalamostriate Artery in light of #1. 3. Intracranial MRA also remarkable for moderate to severe bilateral ACA A2 segment stenosis. 4. No  other acute intracranial abnormality. Other deep gray matter nuclei signal changes and widespread cerebral white matter signal changes most commonly due to chronic small vessel disease. Electronically Signed   By: Odessa Fleming M.D.   On: 05/21/2017 16:34   Dg Chest Port 1 View  Result Date: 05/21/2017 CLINICAL DATA:  Attempted pacemaker placement. EXAM: PORTABLE CHEST 1 VIEW COMPARISON:  None. FINDINGS: Suboptimal inspiration accounts for crowded bronchovascular markings, especially in the bases, and accentuates the cardiac silhouette. Taking this into account, cardiac silhouette upper normal in size to slightly enlarged. Mild central peribronchial thickening. Lungs otherwise clear. Normal pulmonary vascularity. No pleural effusions. No pneumothorax. IMPRESSION: 1. Suboptimal inspiration. Mild changes of bronchitis and/or asthma without focal airspace pneumonia. 2. No pneumothorax. Electronically Signed   By: Hulan Saas M.D.   On: 05/21/2017 20:49      Time spent: 35 minutes  Signed:  Lynden Oxford  [facility] 05/27/2017 , 5:13 PM

## 2017-06-06 NOTE — Progress Notes (Signed)
Physical Therapy Session Note  Patient Details  Name: Nicole [name] MRN: [id number] Date of Birth: 12/01/1928  Today's Date: 06/06/2017 PT Individual Time: 1615-1700 PT Individual Time Calculation (min): 45 min   Short Term Goals: Week 2:  PT Short Term Goal 1 (Week 2): =LTG  Skilled Therapeutic Interventions/Progress Updates:   Pt asleep upon arrival and easily woken up, agreeable to therapy w/ encouragement. Pt noted to still have lunch present and agreeable to sit up w/o back support to eat at EOB. Maintain dynamic sitting balance for 10-15 min w/ close supervision using both UEs to eat lunch w/ set-up assist only. Occasional verbal cues to attend to task and to engage in balance strategies when reaching outside of BOS. Pt requesting to make tea and transferred to w/c via stand pivot w/ min assist and pt self-propelled w/c to nurses station w/ verbal cues for technique and to attend to task using BUEs. Provided w/ set-up assist w/ making tea to engage in OOB activity w/ occasional encouragement to participate. Returned to room total assist in w/c for time management and ended session in w/c, call bell within reach and all needs met. Chair alarm on and QRB engaged.   Therapy Documentation Precautions:  Precautions Precautions: Fall Precaution Comments: Confusion Restrictions Weight Bearing Restrictions: No General: PT Amount of Missed Time (min): 30 Minutes PT Missed Treatment Reason: Patient fatigue Vital Signs: Therapy Vitals Temp: 98.2 F (36.8 C) Temp Source: Oral Pulse Rate: (!) 46 Resp: 18 BP: (!) 158/45 Patient Position (if appropriate): Lying Oxygen Therapy SpO2: 94 % O2 Device: Not Delivered  See Function Navigator for Current Functional Status.   Therapy/Group: Individual Therapy   K Arnette 06/06/2017, 5:09 PM  

## 2017-06-06 NOTE — Progress Notes (Signed)
Physical Therapy Note  Patient Details  Name: Nicole [name] MRN: [id number] Date of Birth: 09/17/1928 Today's Date: 06/06/2017    Attempted to see pt for scheduled therapy session.  Pt asleep in bed and unable to be roused.  Attempted bright lights, removing covers, and elevating HOB, but pt continues to keep eyes closed and does not respond to PT with verbal or tactile cues.  Left positioned in bed with call bell in reach and needs met.  Missed 30 minutes skilled PT.    Michel Santee 06/06/2017, 3:24 PM

## 2017-06-06 NOTE — Progress Notes (Signed)
MD Kirstiens notified of orthostatic vital results. No new BP Medication changes at this time. Continue with plan of care.

## 2017-06-06 NOTE — Progress Notes (Signed)
[place] PHYSICAL MEDICINE & REHABILITATION     PROGRESS NOTE  Subjective/Complaints:   Pt awake and  Alert , conversed in [place], denies pains, asking about the weather outside ROS: Limited due to cognitive/behavioral    Objective: Vital Signs: Blood pressure (!) 160/46, pulse (!) 47, temperature 98.1 F (36.7 C), temperature source Oral, resp. rate 16, height 5\' 8"  (1.727 m), weight 61.6 kg (135 lb 14.4 oz), SpO2 95 %. No results found. Recent Labs    06/05/17 0531  WBC 8.9  HGB 10.7*  HCT 35.0*  PLT 273   Recent Labs    06/05/17 0531  NA 139  K 3.3*  CL 105  GLUCOSE 114*  BUN 28*  CREATININE 1.01*  CALCIUM 8.8*   CBG (last 3)  No results for input(s): GLUCAP in the last 72 hours.  Wt Readings from Last 3 Encounters:  06/06/17 61.6 kg (135 lb 14.4 oz)  05/27/17 64.6 kg (142 lb 6.7 oz)    Physical Exam:  BP (!) 160/46 (BP Location: Right Arm)   Pulse (!) 47   Temp 98.1 F (36.7 C) (Oral)   Resp 16   Ht 5\' 8"  (1.727 m)   Wt 61.6 kg (135 lb 14.4 oz)   SpO2 95%   BMI 20.66 kg/m  Constitutional: She appears well-developed. Frail  HENT: Normocephalic and atraumatic.  Eyes: EOM are normal. No discharge.  Cardiovascular: bradycardia Respiratory: CTA Bilaterally without wheezes or rales. Normal effort .  GI: Bowel sounds are normal. Nondistended.. Musculoskeletal: She exhibits no edema or tenderness.  Neurological: A&Ox1. Does not engage Expressive language deficits persistent She was able to follow simple commands  Motor: 4+/5 in Bilateral delt, bi, tri, grip (stable) LLE: 4/5 proximal to distal  RLE: 4/5 proximal to distal   Skin: Skin is warm and dry.  Psychiatric: Her affect is blunt. Her speech is delayed. She is slowed. Cognition and memory are impaired.    Assessment/Plan: 1. Functional deficits secondary to left PCA infarction which require 3+ hours per day of interdisciplinary therapy in a comprehensive inpatient rehab setting. Physiatrist  is providing close team supervision and 24 hour management of active medical problems listed below. Physiatrist and rehab team continue to assess barriers to discharge/monitor patient progress toward functional and medical goals.  Function:  Bathing Bathing position   Position: Shower  Bathing parts Body parts bathed by patient: Right arm, Left upper leg, Left arm, Chest, Right upper leg, Abdomen, Front perineal area, Buttocks, Right lower leg, Left lower leg Body parts bathed by helper: Back  Bathing assist Assist Level: Touching or steadying assistance(Pt > 75%)      Upper Body Dressing/Undressing Upper body dressing   What is the patient wearing?: Pull over shirt/dress     Pull over shirt/dress - Perfomed by patient: Thread/unthread right sleeve, Thread/unthread left sleeve, Put head through opening, Pull shirt over trunk Pull over shirt/dress - Perfomed by helper: Thread/unthread right sleeve, Thread/unthread left sleeve, Put head through opening        Upper body assist Assist Level: Supervision or verbal cues      Lower Body Dressing/Undressing Lower body dressing   What is the patient wearing?: Pants   Underwear - Performed by helper: Thread/unthread right underwear leg, Thread/unthread left underwear leg, Pull underwear up/down Pants- Performed by patient: Thread/unthread right pants leg, Thread/unthread left pants leg, Pull pants up/down Pants- Performed by helper: Pull pants up/down Non-skid slipper socks- Performed by patient: Don/doff right sock, Don/doff left sock  Lower body assist Assist for lower body dressing: Touching or steadying assistance (Pt > 75%)      Toileting Toileting   Toileting steps completed by patient: Adjust clothing prior to toileting, Performs perineal hygiene, Adjust clothing after toileting Toileting steps completed by helper: Adjust clothing after toileting Toileting Assistive Devices: Grab bar or rail   Toileting assist Assist level: Touching or steadying assistance (Pt.75%)   Transfers Chair/bed transfer   Chair/bed transfer method: Stand pivot Chair/bed transfer assist level: Maximal assist (Pt 25 - 49%/lift and lower) Chair/bed transfer assistive device: Armrests, Patent attorneyWalker     Locomotion Ambulation     Max distance: 25 Assist level: Moderate assist (Pt 50 - 74%)   Wheelchair   Type: Manual Max wheelchair distance: 15 Assist Level: Supervision or verbal cues  Cognition Comprehension Comprehension assist level: Understands basic 90% of the time/cues < 10% of the time  Expression Expression assist level: Expresses basic 75 - 89% of the time/requires cueing 10 - 24% of the time. Needs helper to occlude trach/needs to repeat words.  Social Interaction Social Interaction assist level: Interacts appropriately 25 - 49% of time - Needs frequent redirection.  Problem Solving Problem solving assist level: Solves basic 25 - 49% of the time - needs direction more than half the time to initiate, plan or complete simple activities  Memory Memory assist level: Recognizes or recalls 25 - 49% of the time/requires cueing 50 - 75% of the time    Medical Problem List and Plan: 1.  Aphasia with gait and balance disorder secondary to left PCA infarction   Cont CIR---encourage and motivate as possible with therapies 2.  DVT Prophylaxis/Anticoagulation: Subcutaneous Lovenox 3. Pain Management: Hydrocodone as needed 4. Mood/memory loss: Provide emotional support. Provide tele sitter for safety   Fluoxetine started on 12/31-  5. Neuropsych: This patient is not capable of making decisions on her own behalf. 6. Skin/Wound Care: Routine skin checks 7. Fluids/Electrolytes/Nutrition: Routine I&O's    BMP within acceptable range on 12/27    Remeron 7.5 started on 12/31   Encouraging po intake.---check prealbumin with next labs 8. Hypertension: Lisinopril 40 mg daily   Hydralazine 50 mg every 8 hours,  increased to 75 on 12/31   Norvasc increased to 10 mg on 12/30    Wide pulse pressure, check for orthostatic changes prior to change in meds 9. Bradycardia. Attempts at placement of pacemaker 05/21/2017 unsuccessful. Plan to continue to monitor. 10. Constipation. Laxative assistance 11. Hyperlipidemia. Pravachol 12. Prediabetes   Relatively controlled on 12/27 13. Hypoalbuminemia   Supplement initiated on 12/27 14. Acute blood loss anemia   Hemoglobin 10.7 on 1/4   Continue to monitor 15. AKI    BUN/Cr up to 28/1.01 today   Encourage fluids, egin IVF as below 16. Bradycardia- asymptomatic    Failed pacemaker placement 17. Confusion/lethargy   Multifactorial   UCX + for enterococcus faecalis S to macrodantin     -BUN 28, cont IVF monitor for overload   Trazodone stopped  18.  Hypokalemia K 3.3, K in IVF    LOS (Days) 10 A FACE TO FACE EVALUATION WAS PERFORMED  Erick Colacendrew E Kirsteins 06/06/2017 7:27 AM

## 2017-06-07 MED ORDER — AMOXICILLIN 500 MG PO CAPS
500.0000 mg | ORAL_CAPSULE | Freq: Two times a day (BID) | ORAL | Status: DC
  Administered 2017-06-07 – 2017-06-09 (×5): 500 mg via ORAL
  Filled 2017-06-07 (×7): qty 1

## 2017-06-07 NOTE — Progress Notes (Signed)
Hydralazine not given during night shift, medication wasted.

## 2017-06-07 NOTE — Progress Notes (Signed)
[place] PHYSICAL MEDICINE & REHABILITATION     PROGRESS NOTE  Subjective/Complaints:   Awakens briefly to voice ROS: Limited due to cognitive/behavioral    Objective: Vital Signs: Blood pressure (!) 163/45, pulse (!) 50, temperature 98.3 F (36.8 C), temperature source Oral, resp. rate 16, height 5\' 8"  (1.727 m), weight 62 kg (136 lb 11 oz), SpO2 94 %. No results found. Recent Labs    06/05/17 0531  WBC 8.9  HGB 10.7*  HCT 35.0*  PLT 273   Recent Labs    06/05/17 0531 06/06/17 0628  NA 139 137  K 3.3* 3.6  CL 105 102  GLUCOSE 114* 120*  BUN 28* 16  CREATININE 1.01* 0.80  CALCIUM 8.8* 8.9   CBG (last 3)  No results for input(s): GLUCAP in the last 72 hours.  Wt Readings from Last 3 Encounters:  06/07/17 62 kg (136 lb 11 oz)  05/27/17 64.6 kg (142 lb 6.7 oz)    Physical Exam:  BP (!) 163/45   Pulse (!) 50   Temp 98.3 F (36.8 C) (Oral)   Resp 16   Ht 5\' 8"  (1.727 m)   Wt 62 kg (136 lb 11 oz)   SpO2 94%   BMI 20.78 kg/m  Constitutional: She appears well-developed. Frail  HENT: Normocephalic and atraumatic.  Eyes: EOM are normal. No discharge.  Cardiovascular: bradycardia Respiratory: CTA Bilaterally without wheezes or rales. Normal effort .  GI: Bowel sounds are normal. Nondistended.. Musculoskeletal: She exhibits no edema or tenderness.  Neurological: A&Ox1. Does not engage Expressive language deficits persistent She was able to follow simple commands  Motor: 4+/5 in Bilateral delt, bi, tri, grip (stable) LLE: 4/5 proximal to distal  RLE: 4/5 proximal to distal   Skin: Skin is warm and dry.  Psychiatric: Her affect is blunt. Her speech is delayed. She is slowed. Cognition and memory are impaired.    Assessment/Plan: 1. Functional deficits secondary to left PCA infarction which require 3+ hours per day of interdisciplinary therapy in a comprehensive inpatient rehab setting. Physiatrist is providing close team supervision and 24 hour management  of active medical problems listed below. Physiatrist and rehab team continue to assess barriers to discharge/monitor patient progress toward functional and medical goals.  Function:  Bathing Bathing position   Position: Shower  Bathing parts Body parts bathed by patient: Right arm, Left upper leg, Left arm, Chest, Right upper leg, Abdomen, Front perineal area, Buttocks, Right lower leg, Left lower leg Body parts bathed by helper: Back  Bathing assist Assist Level: Touching or steadying assistance(Pt > 75%)      Upper Body Dressing/Undressing Upper body dressing   What is the patient wearing?: Pull over shirt/dress     Pull over shirt/dress - Perfomed by patient: Thread/unthread right sleeve, Thread/unthread left sleeve, Put head through opening, Pull shirt over trunk Pull over shirt/dress - Perfomed by helper: Thread/unthread right sleeve, Thread/unthread left sleeve, Put head through opening        Upper body assist Assist Level: Supervision or verbal cues      Lower Body Dressing/Undressing Lower body dressing   What is the patient wearing?: Pants   Underwear - Performed by helper: Thread/unthread right underwear leg, Thread/unthread left underwear leg, Pull underwear up/down Pants- Performed by patient: Thread/unthread right pants leg, Thread/unthread left pants leg, Pull pants up/down Pants- Performed by helper: Pull pants up/down Non-skid slipper socks- Performed by patient: Don/doff right sock, Don/doff left sock  Lower body assist Assist for lower body dressing: Touching or steadying assistance (Pt > 75%)      Toileting Toileting   Toileting steps completed by patient: Adjust clothing prior to toileting, Performs perineal hygiene, Adjust clothing after toileting Toileting steps completed by helper: Adjust clothing after toileting Toileting Assistive Devices: Grab bar or rail  Toileting assist Assist level: Touching or steadying assistance  (Pt.75%)   Transfers Chair/bed transfer   Chair/bed transfer method: Stand pivot Chair/bed transfer assist level: Touching or steadying assistance (Pt > 75%) Chair/bed transfer assistive device: Armrests, Patent attorneyWalker     Locomotion Ambulation     Max distance: 25 Assist level: Moderate assist (Pt 50 - 74%)   Wheelchair   Type: Manual Max wheelchair distance: 1150' Assist Level: Supervision or verbal cues  Cognition Comprehension Comprehension assist level: Understands basic 90% of the time/cues < 10% of the time  Expression Expression assist level: Expresses basic 75 - 89% of the time/requires cueing 10 - 24% of the time. Needs helper to occlude trach/needs to repeat words.  Social Interaction Social Interaction assist level: Interacts appropriately 25 - 49% of time - Needs frequent redirection.  Problem Solving Problem solving assist level: Solves basic 25 - 49% of the time - needs direction more than half the time to initiate, plan or complete simple activities  Memory Memory assist level: Recognizes or recalls 25 - 49% of the time/requires cueing 50 - 75% of the time    Medical Problem List and Plan: 1.  Aphasia with gait and balance disorder secondary to left PCA infarction   Cont CIR---encourage and motivate as possible with therapies 2.  DVT Prophylaxis/Anticoagulation: Subcutaneous Lovenox 3. Pain Management: Hydrocodone as needed 4. Mood/memory loss: Provide emotional support. Provide tele sitter for safety   Fluoxetine started on 12/31-  5. Neuropsych: This patient is not capable of making decisions on her own behalf. 6. Skin/Wound Care: Routine skin checks 7. Fluids/Electrolytes/Nutrition: Routine I&O's    BMP within acceptable range on 12/27    Remeron 7.5 started on 12/31   Encouraging po intake.---check prealbumin with next labs 8. Hypertension: Lisinopril 40 mg daily   Hydralazine 50 mg every 8 hours, increased to 75 on 12/31   Norvasc increased to 10 mg on 12/30     Wide pulse pressure, negative orthostatic changes , BP should drift down a bit off IVF 9. Bradycardia. Attempts at placement of pacemaker 05/21/2017 unsuccessful. Plan to continue to monitor. 10. Constipation. Laxative assistance 11. Hyperlipidemia. Pravachol 12. Prediabetes   Relatively controlled on 12/27 13. Hypoalbuminemia   Supplement initiated on 12/27 14. Acute blood loss anemia   Hemoglobin 10.7 on 1/4   Continue to monitor 15. AKI    BUN/Cr up to 28/1.01 today   Encourage fluids, egin IVF as below 16. Bradycardia- asymptomatic    Failed pacemaker placement 17. Confusion/lethargy   Multifactorial   UCX + for enterococcus faecalis S to macrodantin     -BUN 16 will D/C IVF   Trazodone stopped  18.  Hypokalemia resolved on KCL in IVF   19.  Gr 2 diastolic dysfunction no signs of fluid overload will d/c IVF now that azotemia resolved LOS (Days) 11 A FACE TO FACE EVALUATION WAS PERFORMED  Erick Colacendrew E Kirsteins 06/07/2017 8:04 AM

## 2017-06-08 ENCOUNTER — Inpatient Hospital Stay (HOSPITAL_COMMUNITY): Payer: Medicare Other

## 2017-06-08 ENCOUNTER — Inpatient Hospital Stay (HOSPITAL_COMMUNITY): Payer: Medicare Other | Admitting: Physical Therapy

## 2017-06-08 ENCOUNTER — Inpatient Hospital Stay (HOSPITAL_COMMUNITY): Payer: Medicare Other | Admitting: Occupational Therapy

## 2017-06-08 LAB — BASIC METABOLIC PANEL
ANION GAP: 7 (ref 5–15)
BUN: 18 mg/dL (ref 6–20)
CO2: 27 mmol/L (ref 22–32)
Calcium: 9 mg/dL (ref 8.9–10.3)
Chloride: 106 mmol/L (ref 101–111)
Creatinine, Ser: 1.05 mg/dL — ABNORMAL HIGH (ref 0.44–1.00)
GFR, EST AFRICAN AMERICAN: 54 mL/min — AB (ref >60)
GFR, EST NON AFRICAN AMERICAN: 47 mL/min — AB (ref >60)
GLUCOSE: 129 mg/dL — AB (ref 65–99)
POTASSIUM: 3.9 mmol/L (ref 3.5–5.1)
Sodium: 140 mmol/L (ref 135–145)

## 2017-06-08 NOTE — Progress Notes (Addendum)
Physical Therapy Note  Patient Details  Name: Nicole [name] MRN: [id number] Date of Birth: January 22, 1929 Today's Date: 06/08/2017    Time: 1100-1110 10 minutes  1:1 No signs of pain.  PT attempts to arouse pt and encourage participation in therapy and out of bed activity with bright lights, removing covers, physically moving pt. Pt resistant to all movement, does not open eyes or engage in therapist.  pt left in bed with needs at hand, RN aware.  Pt missed 50 minutes skilled PT   Time 2: 1400-1430 30 minutes  1:1 No c/o pain.  Pt's son present and pt agreeable to get out of bed.  Pt supervision for supine to sit, initially dizzy upon sitting up but states it resolves " a little" with time.  Pt performs stand pivot transfer with min A, max cues for safe use of RW.  Pt's son states he bought her an Therapist, music"electric scooter" for use at home because pt was not safe with RW prior to admission.  Pt then requires max encouragement and multiple attempts but eventually able to perform gait x 20' with RW with min/mod A for balance and max cues for safety.  Pt and son aware of recommendation for 24/7 assistance at home and HHPT.  Ryen Heitmeyer 06/08/2017, 11:17 AM

## 2017-06-08 NOTE — Progress Notes (Signed)
[place] PHYSICAL MEDICINE & REHABILITATION     PROGRESS NOTE  Subjective/Complaints:   Patient sitting up in wheelchair about to eat breakfast.  He is alert and states that she slept well.  Does complain of being a little cold this morning  ROS: pt denies nausea, vomiting, diarrhea, cough, shortness of breath or chest pain   Objective: Vital Signs: Blood pressure (!) 180/52, pulse (!) 52, temperature 97.9 F (36.6 C), temperature source Oral, resp. rate 18, height 5\' 8"  (1.727 m), weight 62.1 kg (137 lb 0.1 oz), SpO2 98 %. No results found. No results for input(s): WBC, HGB, HCT, PLT in the last 72 hours. Recent Labs    06/06/17 0628  NA 137  K 3.6  CL 102  GLUCOSE 120*  BUN 16  CREATININE 0.80  CALCIUM 8.9   CBG (last 3)  No results for input(s): GLUCAP in the last 72 hours.  Wt Readings from Last 3 Encounters:  06/08/17 62.1 kg (137 lb 0.1 oz)  05/27/17 64.6 kg (142 lb 6.7 oz)    Physical Exam:  BP (!) 180/52 (BP Location: Left Arm) Comment: RN notifed  Pulse (!) 52   Temp 97.9 F (36.6 C) (Oral)   Resp 18   Ht 5\' 8"  (1.727 m)   Wt 62.1 kg (137 lb 0.1 oz)   SpO2 98%   BMI 20.83 kg/m  Constitutional: She appears well-developed. Frail  HENT: Normocephalic and atraumatic.  Eyes: EOM are normal. No discharge.  Cardiovascular: Remains bradycardic Respiratory: CTA Bilaterally without wheezes or rales. Normal effort  GI: Bowel sounds are normal. Nondistended.. Musculoskeletal: She exhibits no edema or tenderness.  Neurological: A&O to person and hospital.  Follows simple directions.  Is much more alert Expressive language deficits persistent She was able to follow simple commands  Motor: 4+/5 in Bilateral delt, bi, tri, grip (stable) LLE: 4/5 proximal to distal  RLE: 4/5 proximal to distal   Skin: Skin is warm and dry.  Psychiatric: Patient is pleasant and cooperative.  Initiates conversation much more spontaneously.  Assessment/Plan: 1. Functional deficits  secondary to left PCA infarction which require 3+ hours per day of interdisciplinary therapy in a comprehensive inpatient rehab setting. Physiatrist is providing close team supervision and 24 hour management of active medical problems listed below. Physiatrist and rehab team continue to assess barriers to discharge/monitor patient progress toward functional and medical goals.  Function:  Bathing Bathing position   Position: Shower  Bathing parts Body parts bathed by patient: Right arm, Left upper leg, Left arm, Chest, Right upper leg, Abdomen, Front perineal area, Buttocks, Right lower leg, Left lower leg Body parts bathed by helper: Back  Bathing assist Assist Level: Touching or steadying assistance(Pt > 75%)      Upper Body Dressing/Undressing Upper body dressing   What is the patient wearing?: Pull over shirt/dress     Pull over shirt/dress - Perfomed by patient: Thread/unthread right sleeve, Thread/unthread left sleeve, Put head through opening, Pull shirt over trunk Pull over shirt/dress - Perfomed by helper: Thread/unthread right sleeve, Thread/unthread left sleeve, Put head through opening        Upper body assist Assist Level: Supervision or verbal cues      Lower Body Dressing/Undressing Lower body dressing   What is the patient wearing?: Pants   Underwear - Performed by helper: Thread/unthread right underwear leg, Thread/unthread left underwear leg, Pull underwear up/down Pants- Performed by patient: Thread/unthread right pants leg, Thread/unthread left pants leg, Pull pants up/down Pants- Performed  by helper: Pull pants up/down Non-skid slipper socks- Performed by patient: Don/doff right sock, Don/doff left sock                    Lower body assist Assist for lower body dressing: Touching or steadying assistance (Pt > 75%)      Toileting Toileting   Toileting steps completed by patient: Adjust clothing prior to toileting, Performs perineal hygiene, Adjust  clothing after toileting Toileting steps completed by helper: Adjust clothing after toileting Toileting Assistive Devices: Grab bar or rail  Toileting assist Assist level: Touching or steadying assistance (Pt.75%)   Transfers Chair/bed transfer   Chair/bed transfer method: Stand pivot Chair/bed transfer assist level: Touching or steadying assistance (Pt > 75%) Chair/bed transfer assistive device: Armrests, Patent attorney     Max distance: 25 Assist level: Moderate assist (Pt 50 - 74%)   Wheelchair   Type: Manual Max wheelchair distance: 50' Assist Level: Supervision or verbal cues  Cognition Comprehension Comprehension assist level: Understands basic 90% of the time/cues < 10% of the time  Expression Expression assist level: Expresses basic 75 - 89% of the time/requires cueing 10 - 24% of the time. Needs helper to occlude trach/needs to repeat words.  Social Interaction Social Interaction assist level: Interacts appropriately 25 - 49% of time - Needs frequent redirection.  Problem Solving Problem solving assist level: Solves basic 25 - 49% of the time - needs direction more than half the time to initiate, plan or complete simple activities  Memory Memory assist level: Recognizes or recalls 25 - 49% of the time/requires cueing 50 - 75% of the time    Medical Problem List and Plan: 1.  Aphasia with gait and balance disorder secondary to left PCA infarction   Cont CIR--- patient more engaging this morning.  We will see if this translates into better and more consistent anticipation with therapy. 2.  DVT Prophylaxis/Anticoagulation: Subcutaneous Lovenox 3. Pain Management: Hydrocodone as needed 4. Mood/memory loss: Provide emotional support. Provide tele sitter for safety   Fluoxetine started on 12/31-  5. Neuropsych: This patient is not capable of making decisions on her own behalf. 6. Skin/Wound Care: Routine skin checks 7. Fluids/Electrolytes/Nutrition: Routine  I&O's    BMP within acceptable range on 12/27    Remeron 7.5 started on 12/31   Encouraging po intake.---check prealbumin with next labs 8. Hypertension: Lisinopril 40 mg daily   Hydralazine 50 mg every 8 hours, increased to 75 on 12/31   Norvasc increased to 10 mg on 12/30    Overall blood pressure has shown improvement although it still tends to fluctuate at times 9. Bradycardia. Attempts at placement of pacemaker 05/21/2017 unsuccessful. Plan to continue to monitor. 10. Constipation. Laxative assistance 11. Hyperlipidemia. Pravachol 12. Prediabetes     13. Hypoalbuminemia   Supplement initiated on 12/27 14. Acute blood loss anemia   Hemoglobin 10.7 on 1/4   Continue to monitor 15. AKI    BUN/Cr improved to 16/0.8 after IV fluids     16. Bradycardia- asymptomatic    Failed pacemaker placement 17. Confusion/lethargy   Multifactorial   UCX + for enterococcus faecalis S to macrodantin     -Patient showing some improvement over the weekend.   -BUN and creatinine improved on Saturday.  IV fluids stopped.  Continue to encourage p.o. intake  18.  Hypokalemia resolved on KCL in IVF   -Recheck tomorrow   19.  Gr 2 diastolic dysfunction no signs of  fluid overload -off IV fluids   LOS (Days) 12 A FACE TO FACE EVALUATION WAS PERFORMED  Felica Chargois T 06/08/2017 9:09 AM

## 2017-06-08 NOTE — Progress Notes (Signed)
Occupational Therapy Session Note  Patient Details  Name: Nicole [name] MRN: [id number] Date of Birth: 1928-11-23  Today's Date: 06/08/2017 OT Individual Time: [id number] OT Individual Time Calculation (min): 25 min  and Today's Date: 06/08/2017 OT Missed Time: 35 Minutes Missed Time Reason: Patient fatigue   Short Term Goals: Week 1:  OT Short Term Goal 1 (Week 1): Pt will complete shower transfer with min A  OT Short Term Goal 2 (Week 1): Pt will completed grooming in standing with CGA OT Short Term Goal 3 (Week 1): Pt will complete toileting task with CGA OT Short Term Goal 4 (Week 1): Pt will complete LB dressing with CGA   Skilled Therapeutic Interventions/Progress Updates:    Pt received in bed on her side sleeping tucked under covers.  Attempted for a significant amount of time to get pt out of bed including ambulating to the toilet,  Looking at photo pictures, listening to a story read to her (that she wrote).  Pt would respond verbally but continually stated she was too tired and too cold to get out of bed.  Per nurse tech, she did get out of bed earlier to use the bathroom.  Pt was not willing to sit up or open her eyes.  "I am too tired and cold".  Pt left in room resting with bed alarm on.  Therapy Documentation Precautions:  Precautions Precautions: Fall Precaution Comments: Confusion Restrictions Weight Bearing Restrictions: No  Pain: Pain Assessment Pain Assessment: No/denies pain ADL: See Function Navigator for Current Functional Status.   Therapy/Group: Individual Therapy  [name] 06/08/2017, 10:13 AM

## 2017-06-08 NOTE — Progress Notes (Signed)
Occupational Therapy Weekly Progress Note  Patient Details  Name: Nicole [name] MRN: [id number] Date of Birth: 12/27/1928  Beginning of progress report period: May 28, 2017 End of progress report period: June 08, 2017   Patient has met 0 of 4 short term goals.  Pt has partly met the STGs but has not been engaging in self care and therapy consistently to determine that she is fully at a min A/ steady A level.  During standing, she continues to have a posterior lean.  Pt has frequently refused therapy due to feeling cold or tired. Per nurse tech reports, pt will cooperate well with going to the bathroom and will walk to the bathroom with her RW with min A. Pt is having great difficulty with orientation, awareness of her condition which greatly impacts her ability to participate. She also has been running  low blood pressure.  Patient continues to demonstrate the following deficits: muscle weakness, decreased cardiorespiratoy endurance, decreased awareness, decreased memory and delayed processing and decreased standing balance, decreased postural control and decreased balance strategies and therefore will continue to benefit from skilled OT intervention to enhance overall performance with BADL.  Patient not progressing toward long term goals.  See goal revision..  Plan of care revisions:  LTGs of standing balance, grooming, toileting, toilet transfers, LB dressing, and shower transfers downgraded from S to min A.  OT Short Term Goals Week 1:  OT Short Term Goal 1 (Week 1): Pt will complete shower transfer with min A  OT Short Term Goal 1 - Progress (Week 1): Partly met(Pt did complete a shower transfer with min A on first day of evaluation but has refused a shower since that time.) OT Short Term Goal 2 (Week 1): Pt will completed grooming in standing with CGA OT Short Term Goal 2 - Progress (Week 1): Partly met(pt can stand with CGA but has been declining to do so due to fatigue) OT Short  Term Goal 3 (Week 1): Pt will complete toileting task with CGA OT Short Term Goal 3 - Progress (Week 1): Partly met(Pt has been inconsistenly participating with OT, but nurse techs reports that pt has been toileting with steady A) OT Short Term Goal 4 (Week 1): Pt will complete LB dressing with CGA  OT Short Term Goal 4 - Progress (Week 1): Partly met(Pt has been requiring min A.) Week 2:  OT Short Term Goal 1 (Week 2): STG = LTG       Therapy Documentation Precautions:  Precautions Precautions: Fall Precaution Comments: Confusion Restrictions Weight Bearing Restrictions: No    See Function Navigator for Current Functional Status.   [place] 06/08/2017, 8:26 AM

## 2017-06-08 NOTE — Progress Notes (Signed)
Speech Language Pathology Daily Session Note  Patient Details  Name: Nicole [name] MRN: [id number] Date of Birth: [birth year]  Today's Date: 06/08/2017 SLP Individual Time: 1230-1300 /1000-1020 SLP Individual Time Calculation (min): 30 min and 20 min  Short Term Goals: Week 2: SLP Short Term Goal 1 (Week 2): STG=LTG due to remaining length of stay   Skilled Therapeutic Interventions: 1# Skilled ST services focused on cognitive skills. Pt was asleep upon entering room and required Max A verbal/tatcile cues to gain alertness. Pt stated she was "tried." Pt demonstrated ability to pull self up in bed with Max A verbal cues, however refused to open eyes. Pt demonstrated ability to follow 1 step directions utilizing common items with Max a verbal cues and 50% accuracy, however pt briefly opened eyes to complete task. SLP was unable to locate memory notebook in room. Pt was left with call bell within reach. Recommend to continue skilled St services.  2# Skilled ST services focused on cognitive and swallow skills. Pt required Max a verbal/tatcile cues to gain alertness. Pt demonstrated safe PO consumption of regular textures and thin liquids with supervision A verbal cues to continue consumption. SLP facilitated orientation to time/place and situation utilizing sign in room, pt demonstrated difficulty reading sign and required Mod-Max A verbal cues for orientation. SLP facilitated auditory comprehension and immediate recall utilizing sentences from pt's book ( she wrote and illustrated) pt required Max verbal cues to recall facts with 60% accuracy. Pt was left in room with call bell within reach. Recommend to continue skilled ST services and prepare for discharge.     Function:  Eating Eating   Modified Consistency Diet: No Eating Assist Level: Set up assist for;More than reasonable amount of time;Supervision or verbal cues   Eating Set Up Assist For: Opening containers;Cutting food        Cognition Comprehension Comprehension assist level: Understands basic 90% of the time/cues < 10% of the time  Expression   Expression assist level: Expresses basic 75 - 89% of the time/requires cueing 10 - 24% of the time. Needs helper to occlude trach/needs to repeat words.  Social Interaction Social Interaction assist level: Interacts appropriately 25 - 49% of time - Needs frequent redirection.  Problem Solving Problem solving assist level: Solves basic 25 - 49% of the time - needs direction more than half the time to initiate, plan or complete simple activities  Memory Memory assist level: Recognizes or recalls 25 - 49% of the time/requires cueing 50 - 75% of the time    Pain Pain Assessment Pain Assessment: No/denies pain  Therapy/Group: Individual Therapy  Nicole [name]  [facility] 06/08/2017, 3:57 PM

## 2017-06-09 ENCOUNTER — Inpatient Hospital Stay (HOSPITAL_COMMUNITY): Payer: Medicare Other | Admitting: Physical Therapy

## 2017-06-09 ENCOUNTER — Encounter (HOSPITAL_COMMUNITY): Payer: Medicare Other | Admitting: Speech Pathology

## 2017-06-09 ENCOUNTER — Inpatient Hospital Stay (HOSPITAL_COMMUNITY): Payer: Medicare Other | Admitting: Occupational Therapy

## 2017-06-09 ENCOUNTER — Ambulatory Visit (HOSPITAL_COMMUNITY): Payer: Medicare Other | Admitting: Physical Therapy

## 2017-06-09 DIAGNOSIS — I441 Atrioventricular block, second degree: Secondary | ICD-10-CM

## 2017-06-09 LAB — BASIC METABOLIC PANEL
Anion gap: 9 (ref 5–15)
BUN: 22 mg/dL — AB (ref 6–20)
CALCIUM: 9 mg/dL (ref 8.9–10.3)
CO2: 24 mmol/L (ref 22–32)
CREATININE: 0.92 mg/dL (ref 0.44–1.00)
Chloride: 106 mmol/L (ref 101–111)
GFR calc Af Amer: >60 mL/min (ref >60)
GFR, EST NON AFRICAN AMERICAN: 55 mL/min — AB (ref >60)
GLUCOSE: 108 mg/dL — AB (ref 65–99)
Potassium: 4 mmol/L (ref 3.5–5.1)
Sodium: 139 mmol/L (ref 135–145)

## 2017-06-09 NOTE — Discharge Summary (Signed)
Nicole [name] NO.:  [id number]  MEDICAL RECORD NO.:  [id number]  LOCATION:                                 FACILITY:  PHYSICIAN:  Maryla Morrow, MD        DATE OF BIRTH:  Jan 21, 1929  DATE OF ADMISSION:  05/27/2017 DATE OF DISCHARGE:  06/10/2017                              DISCHARGE SUMMARY   DISCHARGE DIAGNOSES: 1. Left posterior cerebral artery infarction. 2. Subcutaneous Lovenox for deep vein thrombosis prophylaxis. 3. Pain management. 4. Mood with memory loss. 5. Hypertension. 6. Bradycardia. 7. Constipation. 8. Hyperlipidemia. 9. Prediabetes. 10.Decreased nutritional storage. 11.Acute blood loss anemia. 12.Acute kidney injury. 13.Confusion with lethargy as well as Enterococcus faecalis urinary     tract infection. 14.Hypokalemia, resolved. 15.Grade 2 dysfunction.  This is an 84 year old, right-handed female with history of hypertension, lives downstairs apartment of her son and daughter-in- law's home.  She has a hired Engineer, structural.  Used rolling walker for ambulation prior to admission.  Son and daughter reported cognitive decline over the past 5-6 months.  Presented on May 21, 2017, with altered mental status.  No reports of recent trauma.  Noted bradycardia in the 40s, systolic depression in the 200s.  EKG showed Mobitz type 2 block.  Cranial CT scan negative.  Troponin negative.  MRI of the brain showed acute lacunar infarction of the anterior left thalamus. Associated severe left PCA stenosis.  Echocardiogram with ejection fraction of 65% grade 2 diastolic dysfunction.  Cardiology Service is consulted.  Attempts at pacemaker unsuccessful.  The patient not able to tolerate it.  Advised to continue to monitor.  Carotid Dopplers, no ICA stenosis.  Subcutaneous Lovenox for DVT prophylaxis.  The patient was admitted for comprehensive rehab program.  PAST MEDICAL HISTORY:  See discharge diagnoses.  SOCIAL HISTORY:  Lives in the basement  of son and daughter-in-law's home.  Has a hired caregiver.  FUNCTIONAL STATUS UPON ADMISSION TO REHAB SERVICES:  Minimal assist 80 feet rolling walker, moderate assist sit to stand, min to mod assist activities of daily living.  PHYSICAL EXAMINATION:  VITAL SIGNS:  Blood pressure 180/55, pulse 48, temperature 98, respirations 16. GENERAL:  The patient did follow simple commands, oriented x1, pleasantly confused. HEENT:  EOMs intact. NECK:  Supple and nontender.  No JVD. HEART:  Noted bradycardia. ABDOMEN:  Soft, nontender.  Good bowel sounds. LUNGS:  Clear to auscultation without wheeze.  REHABILITATION HOSPITAL COURSE:  The patient was admitted to Inpatient Rehab Services with therapies initiated on a 3-hour daily basis, consisting of physical therapy, occupational therapy, speech therapy, and rehabilitation nursing.  The following issues were addressed during the patient's rehabilitation stay.  Pertaining to Nicole [name]'s left PCA infarction, remained stable, she remained on aspirin therapy, she would follow up with neurology Services.  Subcutaneous Lovenox for DVT prophylaxis.  Pain management with use of Tylenol.  Prozac was initiated for mood, needed some encouragement to participate, emotional support provided, followed by Neuropsychology.  Noted hypertension, Cardiology consulted, presently on lisinopril 40 mg daily, hydralazine 75 mg every 8 hours, Norvasc 10 mg daily.  Bradycardia, initial attempts at pacemaker per Cardiology Services on May 21, 2017, unsuccessful. Followup Cardiology Services on June 09, 2017, in regard to bradycardia.  Did not feel at this time patient a candidate for pacemaker and advised to continue to monitor.  She had no chest pain. Oxygen saturations greater than 90% on room air.  She was treated with amoxicillin for an Enterococcus faecalis UTI.  Confusion overall felt to be multifactorial in nature.  Family noted generalized cognitive  decline over the past 5-6 months.  Acute blood loss anemia, 10.7, no bleeding episodes.  She was on a regular diet.  Close monitoring of overall nutritional intake.  The patient received weekly collaborative interdisciplinary team conferences to discuss estimated length of stay, family teaching, any barriers to discharge.  She was ambulating 25 feet minimal assist rolling walker with son assisting, performed bed mobility and transfers minimal assist, working with energy conservation techniques, needing some encouragement to participate.  She did refuse at times to participate with the shower.  She was able to get out of bed, ambulate to the toilet.  Dressed with minimal assistance.  Full family teaching was completed.  Plan was discharge to home where the patient had hired caregivers and family assistance.  DISCHARGE MEDICATIONS: 1. Norvasc 10 mg p.o. daily. 2. Amoxicillin 500 mg p.o. every 12 hours, completing a 7-day course. 3. Aspirin 81 mg p.o. daily. 4. Prozac 10 mg p.o. daily. 5. Hydralazine 75 mg p.o. every 8 hours. 6. Lisinopril 40 mg p.o. daily. 7. Pravachol 40 mg p.o. daily. 8. Tylenol as needed.  DIET:  Her diet was regular.  FOLLOWUP:  She would follow up with Dr. Maryla MorrowAnkit Patel at the Outpatient Rehab Service office as directed; Dr. Roda ShuttersXu, Neurology Services, call for appointment in 6 weeks; Dr. Lewayne BuntingGregg Taylor, Cardiology Services, call for appointment; Dannielle KarvonenMelissa Cousins, Medical Management on June 23, 2017.     Nicole [name], P.A.   ______________________________ Maryla MorrowAnkit Patel, MD    DA/MEDQ  D:  06/09/2017  T:  06/09/2017  Job:  [id number]  cc:   Maryla MorrowAnkit Patel, MD Doylene CanningGregg W. Ladona Ridgelaylor, MD Dr. Helayne SeminoleXu Melissa Cousins

## 2017-06-09 NOTE — Progress Notes (Signed)
Occupational Therapy Discharge Summary  Patient Details  Name: Nicole [name] MRN: [id number] Date of Birth: 11-21-1928   Patient has met 10 of 10 long term goals due to improved activity tolerance.  Patient to discharge at [facility] Assist level.  Patient's care partner is independent to provide the necessary physical and cognitive assistance at discharge.   For the last several days, pt has refused to participate in a shower or other therapy with OT. She refused to participate on her last day of therapy. Per nursing reports, pt is able to get out of bed and ambulate to toilet, toilet and dress with min A.  Pt has the physical capability to complete self care with min A but is often c/o of dizziness, fatigue or feeling cold.  Her family is fully aware and prepared to provide 24/7 supervision at home and care for her.    Reasons goals not met: n/a  Recommendation:  Patient will benefit from ongoing skilled OT services in home health setting to continue to advance functional skills in the area of BADL.  Equipment: No equipment provided  Reasons for discharge: treatment goals met  Patient/family agrees with progress made and goals achieved: Yes  OT Discharge Precautions/Restrictions  Precautions Precautions: Fall Restrictions Weight Bearing Restrictions: No  ADL ADL Eating: Set up Where Assessed-Eating: Wheelchair Grooming: Minimal assistance Where Assessed-Grooming: Standing at sink Upper Body Bathing: Minimal assistance Where Assessed-Upper Body Bathing: Shower Lower Body Bathing: Minimal assistance Where Assessed-Lower Body Bathing: Shower Upper Body Dressing: Setup Where Assessed-Upper Body Dressing: Wheelchair Lower Body Dressing: Min assistance Where Assessed-Lower Body Dressing: Wheelchair Toileting: Minimal assistance Where Assessed-Toileting: Glass blower/designer: Field seismologist Method: Arts development officer: Nurse, adult Transfer: Dance movement psychotherapist Method: Radiographer, therapeutic: Civil engineer, contracting with back, Grab bars(from w/c) ADL Comments: cues for thoroughness Vision Baseline Vision/History: Wears glasses Wears Glasses: At all times Patient Visual Report: No change from baseline Vision Assessment?: No apparent visual deficits Perception  Perception: Within Functional Limits Praxis Praxis: Impaired Praxis Impairment Details: Ideation;Initiation Cognition Overall Cognitive Status: Impaired/Different from baseline Arousal/Alertness: Lethargic Focused Attention: Impaired Sustained Attention: Impaired Selective Attention: Impaired Selective Attention Impairment: Verbal basic;Functional basic Memory: Impaired Awareness: Impaired Awareness Impairment: Emergent impairment Safety/Judgment: Impaired Sensation Sensation Light Touch: Appears Intact Stereognosis: Appears Intact Hot/Cold: Appears Intact Proprioception: Appears Intact Coordination Gross Motor Movements are Fluid and Coordinated: Yes Motor  Motor Motor - Discharge Observations: generalized weakness Mobility    min A with RW to ambulate to bathroom Trunk/Postural Assessment  Cervical Assessment Cervical Assessment: (fwd head) Thoracic Assessment Thoracic Assessment: (kyphosis) Lumbar Assessment Lumbar Assessment: (posterior pelvic titl) Postural Control Righting Reactions: delayed  Balance Static Standing Balance Static Standing - Comment/# of Minutes: min A posterior lean Dynamic Standing Balance Dynamic Standing - Comments: min A posterior lean Extremity/Trunk Assessment RUE Assessment RUE Assessment: Within Functional Limits LUE Assessment LUE Assessment: Within Functional Limits   See Function Navigator for Current Functional Status.  [place] 06/09/2017, 12:11 PM

## 2017-06-09 NOTE — Progress Notes (Signed)
Speech Language Pathology Discharge Summary  Patient Details  Name: Nicole [name] MRN: [id number] Date of Birth: 1929/03/09  Today's Date: 06/09/2017 SLP Individual Time: 1300-1350 SLP Individual Time Calculation (min): 50 min   Skilled Therapeutic Interventions:  Pt was seen for skilled ST targeting cognitive-linguistic goals.  Pt was sitting up in wheelchair upon therapist's arrival with head in her hands and complaints of fatigue.  With encouragement, pt was agreeable to participating in therapy.  Pt named objects from [facility] box for 80% accuracy independently, which improved to >90% accuracy with min semantic cues.  Pt needed mod assist for word finding to name objects from description.  Therapist also facilitated the session with a basic, visuospatial/constructional problem solving task to address sustained attention and problem solving to task.  Pt sustained her attention to task for ~15 minutes with x2-3 verbal cues for redirection.  She needed min-mod verbal cues to recognize and correct errors of omission from task.  Pt's son and daughter in law were present for the second half of today's therapy session and all questions were answered to their satisfaction at this time.  Pt was returned to room and left in wheelchair with family at bedside. Pt is ready for discharge tomorrow.       Patient has met 3 of 5 long term goals.  Patient to discharge at overall Mod;Min level.  Reasons goals not met:     Clinical Impression/Discharge Summary:  Pt has made functional gains and is discharging having met 3 out of 5 short term goals.  Pt is currently min-supervision assist for verbal expression due to word finding deficits.  Pt also needs min--mod assist for basic tasks due to mild-moderate cognitive impairment.  Pt is discharging home with 24/7 supervision from family and paid caregivers.  SLP recommends additional ST follow up at next level of care to continue to address cognitive-linguistic  function.  Pt and family education is complete at this time.    Care Partner:  Caregiver Able to Provide Assistance: Yes  Type of Caregiver Assistance: Physical;Cognitive  Recommendation:  Home Health SLP;Outpatient SLP;24 hour supervision/assistance  Rationale for SLP Follow Up: Maximize functional communication;Maximize cognitive function and independence;Reduce caregiver burden   Equipment: none recommended by SLP    Reasons for discharge: Discharged from hospital   Patient/Family Agrees with Progress Made and Goals Achieved: Yes   Function:  Eating Eating                 Cognition Comprehension Comprehension assist level: Understands basic 90% of the time/cues < 10% of the time  Expression   Expression assist level: Expresses basic 75 - 89% of the time/requires cueing 10 - 24% of the time. Needs helper to occlude trach/needs to repeat words.  Social Interaction Social Interaction assist level: Interacts appropriately 75 - 89% of the time - Needs redirection for appropriate language or to initiate interaction.  Problem Solving Problem solving assist level: Solves basic 50 - 74% of the time/requires cueing 25 - 49% of the time  Memory Memory assist level: Recognizes or recalls 25 - 49% of the time/requires cueing 50 - 75% of the time   Emilio Math 06/09/2017, 2:07 PM

## 2017-06-09 NOTE — Progress Notes (Signed)
Occupational Therapy Session Note  Patient Details  Name: Nicole [name] MRN: [id number] Date of Birth: 1928/08/31  Today's Date: 06/09/2017 OT Individual Time: 1100-1130 OT Individual Time Calculation (min): 30 min  and Today's Date: 06/09/2017 OT Missed Time: 30 Minutes Missed Time Reason: Patient fatigue;Patient unwilling/refused to participate without medical reason   Short Term Goals: Week 2:  OT Short Term Goal 1 (Week 2): STG = LTG  Skilled Therapeutic Interventions/Progress Updates:    Pt scheduled for ADL training with family education with pt's son and dtr in law.  Pt received in w/c fully dressed with her head down in her hands.  Attempted numerous times with her family's A to encourage pt to get up and shower and or demonstrate shower stall/ toilet transfers.  Pt continually refusing, stating "go away", and not making eye contact.  Family very understanding of her behavior and are hoping she will cooperate with bathing/ showering at home.  Educated family on how to support her posture in standing as she has had episodes of posterior leaning, dizziness, fatigue, and back pain.  Family stated they are familiar with these deficits.  Discussed strategies for bathing at home where pt feels her privacy is being maintained but while she had constant S.  Emphasized that she can not be left alone in the bathroom.  Pt was not willing to participate with OT in any capacity today.  Declined going back to bed.  Pt in room in w/c with family.  Therapy Documentation Precautions:  Precautions Precautions: Fall Precaution Comments: Confusion Restrictions Weight Bearing Restrictions: No  General OT Amount of Missed Time: 30 Minutes   Pain: Pain Assessment Pain Assessment: No/denies pain   ADL: See Function Navigator for Current Functional Status.   Therapy/Group: Individual Therapy  SAGUIER,JULIA 06/09/2017, 11:54 AM

## 2017-06-09 NOTE — Progress Notes (Signed)
Progress Note  Patient Name: Nicole [name] Date of Encounter: 06/09/2017  Primary Cardiologist: No primary care provider on file.   Subjective   Pt remains aphasic.  Not very participatory in history.  She is working with rehab slowly.  Inpatient Medications    Scheduled Meds: . amLODipine  10 mg Oral Daily  . amoxicillin  500 mg Oral Q12H  . aspirin  81 mg Oral Daily  . enoxaparin (LOVENOX) injection  40 mg Subcutaneous Q24H  . feeding supplement (PRO-STAT SUGAR FREE 64)  30 mL Oral BID  . FLUoxetine  10 mg Oral Daily  . hydrALAZINE  75 mg Oral Q8H  . lisinopril  40 mg Oral Daily  . pravastatin  40 mg Oral q1800   Continuous Infusions:  PRN Meds: acetaminophen, ondansetron **OR** ondansetron (ZOFRAN) IV, sorbitol   Vital Signs    Vitals:   06/08/17 1601 06/08/17 2210 06/09/17 0420 06/09/17 0552  BP:  (!) 153/52 (!) 176/56 (!) 172/54  Pulse: (!) 47  (!) 50   Resp: 14  12   Temp: 98.5 F (36.9 C)  98 F (36.7 C)   TempSrc: Oral  Oral   SpO2: 97%  96%   Weight:      Height:        Intake/Output Summary (Last 24 hours) at 06/09/2017 1020 Last data filed at 06/08/2017 1855 Gross per 24 hour  Intake 180 ml  Output -  Net 180 ml   Filed Weights   06/06/17 0500 06/07/17 0135 06/08/17 0329  Weight: 135 lb 14.4 oz (61.6 kg) 136 lb 11 oz (62 kg) 137 lb 0.1 oz (62.1 kg)    Telemetry    Not on telemetry - Personally Reviewed  ECG    06/01/17 is 2:1 AVBlock, V rate 46bpm, QRS 88ms - Personally Reviewed  Physical Exam   GEN: No acute distress.  Ill appearing Neck: No JVD Cardiac: bradycardic regular rhythm  Respiratory:  CTA b/l. GI: Soft, nontender, non-distended  MS: No edema; No deformity. Neuro:  expressive aphasia Psych: very flat affect   Labs    Chemistry Recent Labs  Lab 06/06/17 0628 06/08/17 1556 06/09/17 0924  NA 137 140 139  K 3.6 3.9 4.0  CL 102 106 106  CO2 26 27 24   GLUCOSE 120* 129* 108*  BUN 16 18 22*  CREATININE 0.80 1.05*  0.92  CALCIUM 8.9 9.0 9.0  GFRNONAA >60 47* 55*  GFRAA >60 54* >60  ANIONGAP 9 7 9      Hematology Recent Labs  Lab 06/05/17 0531  WBC 8.9  RBC 3.90  HGB 10.7*  HCT 35.0*  MCV 89.7  MCH 27.4  MCHC 30.6  RDW 15.1  PLT 273    Cardiac EnzymesNo results for input(s): TROPONINI in the last 168 hours. No results for input(s): TROPIPOC in the last 168 hours.   BNPNo results for input(s): BNP, PROBNP in the last 168 hours.   DDimer No results for input(s): DDIMER in the last 168 hours.   Radiology    No results found.  Cardiac Studies   05/21/17: TTE Study Conclusions - Left ventricle: The cavity size was normal. There was mild   concentric hypertrophy. Systolic function was normal. The   estimated ejection fraction was in the range of 60% to 65%. Wall   motion was normal; there were no regional wall motion   abnormalities. Features are consistent with a pseudonormal left   ventricular filling pattern, with concomitant abnormal relaxation  and increased filling pressure (grade 2 diastolic dysfunction). - Aortic valve: Transvalvular velocity was within the normal range.   There was no stenosis. There was mild regurgitation. Valve area   (Vmax): 1.95 cm^2. - Mitral valve: Transvalvular velocity was within the normal range.   There was no evidence for stenosis. There was trivial   regurgitation. - Left atrium: The atrium was mildly dilated. - Right ventricle: The cavity size was normal. Wall thickness was   normal. Systolic function was normal. - Tricuspid valve: There was mild regurgitation. - Pulmonary arteries: Systolic pressure was moderately increased.   PA peak pressure: 55 mm Hg (S).  05/21/17: Conclusion: Unsuccessful insertion of a dual-chamber pacing system secondary to the patient's inability to cooperate for the procedure.  The patient will require general anesthesia if she is to undergo pacemaker insertion.   Patient Profile     84 y.o. female with a  previous hx of HTN only was admitted to [facility] 05/20/17 with complaints/observations of periods of confusion by family, noted she had self stopped her losartan many months prior , on arrival she was found with SBP 200's, and bradycardic with 2:1 AVBlock V rates 40's.    She was seen by EP, Dr. Ladona Ridgel planned for PPM, though unable to sedate and patient unable to tolerate the procedure, PPM implant was abandoned.  She was as well found with CVA.  Mental status continued to deteriorate.  It was not felt that her bradycardia was contirbuting to her lethargy given good BP, AMS, and decided not to pursue PPM.  She was discharged to [facility] 05/27/17.  EP service is asked to revisit bradycardia   Assessment & Plan    1. Bradycardia, high degree AV block          HR by graphics/vitals appears generally in 40's-50's, d/w RN, patient generally weak, fatigued, sleeps often.   She remains very ill.  Currently too ill to consider PPM.  Her overall prognosis is quite poor.  Given her advanced age, I agree with Dr Ladona Ridgel that a conservative approach is advised.  Would consider formally addressing code status with her family.    2. HTN     Not completely controlled     Deferred to medicine team     AVOID any potential nodal blocking agents  For questions or updates, please contact [facility] Please consult [email or web address] for contact info under Cardiology/STEMI.      Randolm Idol MD 06/09/2017, 10:20 AM

## 2017-06-09 NOTE — Discharge Summary (Signed)
Discharge summary job # [id number]

## 2017-06-09 NOTE — Progress Notes (Signed)
Nicole [name] PHYSICAL MEDICINE & REHABILITATION     PROGRESS NOTE  Subjective/Complaints:  Pt seen lying in bed this AM.  She suggests she did not sleep well overnight by nodding her head.    ROS: Limited due to cognition/behaviour, but appears to deny CP, SOB, N/V/D.  Objective: Vital Signs: Blood pressure (!) 172/54, pulse (!) 50, temperature 98 F (36.7 C), temperature source Oral, resp. rate 12, height 5\' 8"  (1.727 m), weight 62.1 kg (137 lb 0.1 oz), SpO2 96 %. No results found. No results for input(s): WBC, HGB, HCT, PLT in the last 72 hours. Recent Labs    06/08/17 1556  NA 140  K 3.9  CL 106  GLUCOSE 129*  BUN 18  CREATININE 1.05*  CALCIUM 9.0   CBG (last 3)  No results for input(s): GLUCAP in the last 72 hours.  Wt Readings from Last 3 Encounters:  06/08/17 62.1 kg (137 lb 0.1 oz)  05/27/17 64.6 kg (142 lb 6.7 oz)    Physical Exam:  BP (!) 172/54   Pulse (!) 50   Temp 98 F (36.7 C) (Oral)   Resp 12   Ht 5\' 8"  (1.727 m)   Wt 62.1 kg (137 lb 0.1 oz)   SpO2 96%   BMI 20.83 kg/m  Constitutional: She appears well-developed. Frail  HENT: Normocephalic and atraumatic.  Eyes: EOM are normal. No discharge.  Cardiovascular: +Bradycardic Respiratory: CTA Bilaterally without wheezes or rales. Normal effort  GI: Bowel sounds are normal. Nondistended.. Musculoskeletal: She exhibits no edema or tenderness.  Neurological: A&O to person and hospital.   Follows simple directions.   Expressive language deficits persistent Motor: 4+/5 in Bilateral delt, bi, tri, grip (unchanged) LLE: 4/5 proximal to distal  RLE: 4/5 proximal to distal   Skin: Skin is warm and dry.  Psychiatric: Flat.  Assessment/Plan: 1. Functional deficits secondary to left PCA infarction which require 3+ hours per day of interdisciplinary therapy in a comprehensive inpatient rehab setting. Physiatrist is providing close team supervision and 24 hour management of active medical problems listed  below. Physiatrist and rehab team continue to assess barriers to discharge/monitor patient progress toward functional and medical goals.  Function:  Bathing Bathing position   Position: Shower  Bathing parts Body parts bathed by patient: Right arm, Left upper leg, Left arm, Chest, Right upper leg, Abdomen, Front perineal area, Buttocks, Right lower leg, Left lower leg Body parts bathed by helper: Back  Bathing assist Assist Level: Touching or steadying assistance(Pt > 75%)      Upper Body Dressing/Undressing Upper body dressing   What is the patient wearing?: Pull over shirt/dress     Pull over shirt/dress - Perfomed by patient: Thread/unthread right sleeve, Thread/unthread left sleeve, Put head through opening, Pull shirt over trunk Pull over shirt/dress - Perfomed by helper: Thread/unthread right sleeve, Thread/unthread left sleeve, Put head through opening        Upper body assist Assist Level: Supervision or verbal cues      Lower Body Dressing/Undressing Lower body dressing   What is the patient wearing?: Pants   Underwear - Performed by helper: Thread/unthread right underwear leg, Thread/unthread left underwear leg, Pull underwear up/down Pants- Performed by patient: Thread/unthread right pants leg, Thread/unthread left pants leg, Pull pants up/down Pants- Performed by helper: Pull pants up/down Non-skid slipper socks- Performed by patient: Don/doff right sock, Don/doff left sock                    Lower  body assist Assist for lower body dressing: Touching or steadying assistance (Pt > 75%)      Toileting Toileting   Toileting steps completed by patient: Adjust clothing prior to toileting, Performs perineal hygiene, Adjust clothing after toileting Toileting steps completed by helper: Adjust clothing after toileting Toileting Assistive Devices: Grab bar or rail  Toileting assist Assist level: Touching or steadying assistance (Pt.75%)   Transfers Chair/bed  transfer   Chair/bed transfer method: Stand pivot Chair/bed transfer assist level: Touching or steadying assistance (Pt > 75%) Chair/bed transfer assistive device: Armrests, Patent attorney     Max distance: 25 Assist level: Moderate assist (Pt 50 - 74%)   Wheelchair   Type: Manual Max wheelchair distance: 20' Assist Level: Supervision or verbal cues  Cognition Comprehension Comprehension assist level: Understands basic 90% of the time/cues < 10% of the time  Expression Expression assist level: Expresses basic 75 - 89% of the time/requires cueing 10 - 24% of the time. Needs helper to occlude trach/needs to repeat words.  Social Interaction Social Interaction assist level: Interacts appropriately 25 - 49% of time - Needs frequent redirection.  Problem Solving Problem solving assist level: Solves basic 25 - 49% of the time - needs direction more than half the time to initiate, plan or complete simple activities  Memory Memory assist level: Recognizes or recalls 25 - 49% of the time/requires cueing 50 - 75% of the time    Medical Problem List and Plan: 1.  Aphasia with gait and balance disorder secondary to left PCA infarction   Cont CIR   Notes reviewed, discussed with healthcare provider 2.  DVT Prophylaxis/Anticoagulation: Subcutaneous Lovenox 3. Pain Management: Hydrocodone as needed 4. Mood/memory loss: Provide emotional support. Provide tele sitter for safety   Fluoxetine started on 12/31  5. Neuropsych: This patient is not capable of making decisions on her own behalf. 6. Skin/Wound Care: Routine skin checks 7. Fluids/Electrolytes/Nutrition: Routine I&O's    Remeron 7.5 started on 12/31   Encouraging po intake, check prealbumin with next labs 8. Hypertension: Lisinopril 40 mg daily   Hydralazine 50 mg every 8 hours, increased to 75 on 12/31   Norvasc increased to 10 mg on 12/30    Remains elevated 9. Bradycardia. Attempts at placement of pacemaker  05/21/2017 unsuccessful. Plan to continue to monitor. 10. Constipation. Laxative assistance 11. Hyperlipidemia. Pravachol 12. Prediabetes   Relatively controlled on 1/8    13. Hypoalbuminemia   Supplement initiated on 12/27 14. Acute blood loss anemia   Hemoglobin 10.7 on 1/4   Continue to monitor 15. AKI    Cr. 1.05 on 1/7   Encourage fluid intake   Cont to monitor 16. Bradycardia   Failed pacemaker placement   Will discuss with Cardiology 17. Confusion/lethargy   Multifactorial   UCX + for enterococcus faecalis S to macrodantin     Continue to encourage p.o. intake  18.  Hypokalemia resolved on KCL in IVF   K+ 3.9 on 1/7   Cont to monitor 19.  Gr 2 diastolic dysfunction no signs of fluid overload -off IV fluids   LOS (Days) 13 A FACE TO FACE EVALUATION WAS PERFORMED  Ankit Karis Juba 06/09/2017 8:35 AM

## 2017-06-09 NOTE — Progress Notes (Signed)
Physical Therapy Discharge Summary  Patient Details  Name: Markella Dao MRN: [id number] Date of Birth: June 09, 1928  Today's Date: 06/09/2017 PT Individual Time: 1015-1100 PT Individual Time Calculation (min): 45 min   Pt initially resistant to out of bed activity, actively pushing therapist away and stating "no".  Pt's son and daughter in law then present and with max encouragement pt willing to get out of bed.  Pt performs bed mobility and transfers with min A.  Gait x 25' with min A with RW with son assisting. Pt's son assisted pt with bed mobility, transfers, and simulated car transfer all at min A level.  Pt's son states he feels comfortable with pt to d/c home at this level of care.  Session 2: pt states she is too fatigued to perform any more physical therapy today.  Discussed energy conservation and home safety with pt/son.  All agree they feel ready for d/c home tomorrow.  Patient has met 5 of 8 long term goals due to improved balance, increased strength and ability to compensate for deficits.  Patient to discharge at an ambulatory level [place].   Patient's care partner is independent to provide the necessary physical and cognitive assistance at discharge.  Reasons goals not met: pt with decreased participation in therapy program during her stay, decline in cognitive function and no change in strength due to little participation in therapies  Recommendation:  Patient will benefit from ongoing skilled PT services in home health setting to continue to advance safe functional mobility, address ongoing impairments in balance, gait, activity tolerance, and minimize fall risk.  Equipment: w/c  Reasons for discharge: treatment goals met and discharge from hospital  Patient/family agrees with progress made and goals achieved: Yes  PT Discharge Precautions/Restrictions Precautions Precautions: Fall Restrictions Weight Bearing Restrictions: No Pain Pain Assessment Pain  Assessment: No/denies pain  Cognition Overall Cognitive Status: Impaired/Different from baseline Arousal/Alertness: Lethargic Focused Attention: Impaired Sustained Attention: Impaired Selective Attention: Impaired Selective Attention Impairment: Verbal basic;Functional basic Memory: Impaired Awareness: Impaired Awareness Impairment: Emergent impairment Safety/Judgment: Impaired Sensation Sensation Light Touch: Appears Intact Proprioception: Appears Intact Coordination Gross Motor Movements are Fluid and Coordinated: Yes Motor  Motor Motor - Discharge Observations: generalized weakness   Trunk/Postural Assessment  Cervical Assessment Cervical Assessment: (fwd head) Thoracic Assessment Thoracic Assessment: (kyphosis) Lumbar Assessment Lumbar Assessment: (posterior pelvic titl) Postural Control Righting Reactions: delayed  Balance Static Standing Balance Static Standing - Comment/# of Minutes: min A posterior lean Dynamic Standing Balance Dynamic Standing - Comments: min A posterior lean Extremity Assessment      RLE Assessment RLE Assessment: (grossly 3/5) LLE Assessment LLE Assessment: (grossly 3/5)   See Function Navigator for Current Functional Status.  DONAWERTH,KAREN 06/09/2017, 11:03 AM

## 2017-06-10 DIAGNOSIS — I442 Atrioventricular block, complete: Secondary | ICD-10-CM

## 2017-06-10 MED ORDER — AMLODIPINE BESYLATE 10 MG PO TABS: 10.0000 mg | ORAL_TABLET | Freq: Every day | ORAL | 1 refills | Status: AC

## 2017-06-10 MED ORDER — HYDRALAZINE HCL 25 MG PO TABS: 75.0000 mg | ORAL_TABLET | Freq: Three times a day (TID) | ORAL | 0 refills | Status: DC

## 2017-06-10 MED ORDER — AMOXICILLIN 500 MG PO CAPS: 500.0000 mg | ORAL_CAPSULE | Freq: Two times a day (BID) | ORAL | 0 refills | Status: DC

## 2017-06-10 MED ORDER — PRAVASTATIN SODIUM 40 MG PO TABS: 40.0000 mg | ORAL_TABLET | Freq: Every day | ORAL | 0 refills | Status: DC

## 2017-06-10 MED ORDER — LISINOPRIL 40 MG PO TABS: 40.0000 mg | ORAL_TABLET | Freq: Every day | ORAL | 0 refills | Status: DC

## 2017-06-10 MED ORDER — FLUOXETINE HCL 10 MG PO CAPS: 10.0000 mg | ORAL_CAPSULE | Freq: Every day | ORAL | 3 refills | Status: DC

## 2017-06-10 NOTE — Progress Notes (Signed)
Social Work Discharge Note  The overall goal for the admission was met for:   Discharge location: Yes - home with son and dtr-in-law  Length of Stay: Yes - 14 days  Discharge activity level: Yes - supervision to min A  Home/community participation: Yes  Services provided included: MD, RD, PT, OT, SLP, RN, Pharmacy, Neuropsych and SW  Financial Services: Medicare and Private Insurance: [employer]  Follow-up services arranged: Home Health: PT/OT/ST/RN from [facility], DME: 18"x18" lightweight wheelchair with basic cushion from [facility] and Patient/Family has no preference for HH/DME agencies  Comments (or additional information): Pt will return to her home she shares with her son and dtr-in-law.  They have arranged for pt to have private duty caregivers when they are not available through [facility] or [employer].  Family education has occurred and family has prepared home for her return.  Pt is looking forward to being home and hopefully her mood will improve at home.    Patient/Family verbalized understanding of follow-up arrangements: Yes  Individual responsible for coordination of the follow-up plan: pt's son and dtr-in-law  Confirmed correct DME delivered: Trey Sailors 06/10/2017    [place], Silvestre Mesi

## 2017-06-10 NOTE — Progress Notes (Signed)
Progress Note  Patient Name: Nicole [name] Date of Encounter: 06/10/2017  Primary Cardiologist: No primary care provider on file.   Subjective   More interactive today  Inpatient Medications    Scheduled Meds: . amLODipine  10 mg Oral Daily  . amoxicillin  500 mg Oral Q12H  . aspirin  81 mg Oral Daily  . enoxaparin (LOVENOX) injection  40 mg Subcutaneous Q24H  . feeding supplement (PRO-STAT SUGAR FREE 64)  30 mL Oral BID  . FLUoxetine  10 mg Oral Daily  . hydrALAZINE  75 mg Oral Q8H  . lisinopril  40 mg Oral Daily  . pravastatin  40 mg Oral q1800   Continuous Infusions:  PRN Meds: acetaminophen, ondansetron **OR** ondansetron (ZOFRAN) IV, sorbitol   Vital Signs    Vitals:   06/09/17 0552 06/09/17 1500 06/09/17 2145 06/10/17 0300  BP: (!) 172/54 (!) 97/55 (!) 147/40 (!) 168/51  Pulse:  (!) 46  (!) 47  Resp:  14  16  Temp:  98 F (36.7 C)  98.9 F (37.2 C)  TempSrc:  Oral  Oral  SpO2:  97%  98%  Weight:    137 lb 0.4 oz (62.2 kg)  Height:        Intake/Output Summary (Last 24 hours) at 06/10/2017 0910 Last data filed at 06/09/2017 1300 Gross per 24 hour  Intake 120 ml  Output -  Net 120 ml   Filed Weights   06/07/17 0135 06/08/17 0329 06/10/17 0300  Weight: 136 lb 11 oz (62 kg) 137 lb 0.1 oz (62.1 kg) 137 lb 0.4 oz (62.2 kg)    Telemetry    Not on telemetry - Personally Reviewed  ECG    06/01/17 is 2:1 AVBlock, V rate 46bpm, QRS 88ms - Personally Reviewed  Physical Exam   GEN: No acute distress, frail appearing, chronically ill appearing Neck: No JVD Cardiac: bradycardic regular rhythm  Respiratory:  CTA b/l. GI: Soft, nontender, non-distended  MS: No edema; No deformity. Neuro:  expressive aphasia Psych: very flat affect   Labs    Chemistry Recent Labs  Lab 06/06/17 0628 06/08/17 1556 06/09/17 0924  NA 137 140 139  K 3.6 3.9 4.0  CL 102 106 106  CO2 26 27 24   GLUCOSE 120* 129* 108*  BUN 16 18 22*  CREATININE 0.80 1.05* 0.92    CALCIUM 8.9 9.0 9.0  GFRNONAA >60 47* 55*  GFRAA >60 54* >60  ANIONGAP 9 7 9      Hematology Recent Labs  Lab 06/05/17 0531  WBC 8.9  RBC 3.90  HGB 10.7*  HCT 35.0*  MCV 89.7  MCH 27.4  MCHC 30.6  RDW 15.1  PLT 273    Cardiac EnzymesNo results for input(s): TROPONINI in the last 168 hours. No results for input(s): TROPIPOC in the last 168 hours.   BNPNo results for input(s): BNP, PROBNP in the last 168 hours.   DDimer No results for input(s): DDIMER in the last 168 hours.   Radiology    No results found.  Cardiac Studies   05/21/17: TTE Study Conclusions - Left ventricle: The cavity size was normal. There was mild   concentric hypertrophy. Systolic function was normal. The   estimated ejection fraction was in the range of 60% to 65%. Wall   motion was normal; there were no regional wall motion   abnormalities. Features are consistent with a pseudonormal left   ventricular filling pattern, with concomitant abnormal relaxation   and increased filling pressure (  grade 2 diastolic dysfunction). - Aortic valve: Transvalvular velocity was within the normal range.   There was no stenosis. There was mild regurgitation. Valve area   (Vmax): 1.95 cm^2. - Mitral valve: Transvalvular velocity was within the normal range.   There was no evidence for stenosis. There was trivial   regurgitation. - Left atrium: The atrium was mildly dilated. - Right ventricle: The cavity size was normal. Wall thickness was   normal. Systolic function was normal. - Tricuspid valve: There was mild regurgitation. - Pulmonary arteries: Systolic pressure was moderately increased.   PA peak pressure: 55 mm Hg (S).  05/21/17: Conclusion: Unsuccessful insertion of a dual-chamber pacing system secondary to the patient's inability to cooperate for the procedure.  The patient will require general anesthesia if she is to undergo pacemaker insertion.   Patient Profile     84 y.o. female with a  previous hx of HTN only was admitted to [facility] 05/20/17 with complaints/observations of periods of confusion by family, noted she had self stopped her losartan many months prior , on arrival she was found with SBP 200's, and bradycardic with 2:1 AVBlock V rates 40's.    She was seen by EP, Dr. Ladona Ridgel planned for PPM, though unable to sedate and patient unable to tolerate the procedure, PPM implant was abandoned.  She was as well found with CVA.  Mental status continued to deteriorate.  It was not felt that her bradycardia was contirbuting to her lethargy given good BP, AMS, and decided not to pursue PPM.  She was discharged to [facility] 05/27/17.  EP service is asked to revisit bradycardia   Assessment & Plan    1. Bradycardia, high degree AV block      HR by graphics/vitals appears generally in 40's-50's, d/w RN yesterday, patient generally weak, fatigued, sleeps often.   She is seen and examined by Dr. Ladona Ridgel this AM, more converstional today.  No clear symptoms associated with bradycardia, recommend out patient EP follow up (arranged), pending level of recovery/functional status, can re-visit PPM then.  2. HTN     Deferred to medicine team     AVOID any potential nodal blocking agents  For questions or updates, please contact [facility] Please consult [email or web address] for contact info under Cardiology/STEMI.      Signed, Francis Dowse, PA-C 06/10/2017, 9:10 AM    EP Attending  Patient seen and examined. Agree with above. At this point she may be discharged to home or SNF as per her families ability to care for her. The risk/benefits of PPM insertion at this point would go against insertion of a PPM. If her clinical situation changed, then we might re-consider placing a PPM.  Leonia Reeves.D.

## 2017-06-10 NOTE — Discharge Instructions (Signed)
Inpatient Rehab Discharge Instructions  Nicole [name] Discharge date and time: No discharge date for patient encounter.   Activities/Precautions/ Functional Status: Activity: activity as tolerated Diet: regular diet Wound Care: none needed Functional status:  ___ No restrictions     ___ Walk up steps independently ___ 24/7 supervision/assistance   ___ Walk up steps with assistance ___ Intermittent supervision/assistance  ___ Bathe/dress independently ___ Walk with walker     _x STROKE/TIA DISCHARGE INSTRUCTIONS SMOKING Cigarette smoking nearly doubles your risk of having a stroke & is the single most alterable risk factor  If you smoke or have smoked in the last 12 months, you are advised to quit smoking for your health.  Most of the excess cardiovascular risk related to smoking disappears within a year of stopping.  Ask you doctor about anti-smoking medications  [place] Quit Line: 1-800-QUIT NOW  Free Smoking Cessation Classes [phone]  CHOLESTEROL Know your levels; limit fat & cholesterol in your diet  Lipid Panel     Component Value Date/Time   CHOL 215 (H) 05/22/2017 0842   TRIG 179 (H) 05/22/2017 0842   HDL 37 (L) 05/22/2017 0842   CHOLHDL 5.8 05/22/2017 0842   VLDL 36 05/22/2017 0842   LDLCALC 142 (H) 05/22/2017 0842      Many patients benefit from treatment even if their cholesterol is at goal.  Goal: Total Cholesterol (CHOL) less than 160  Goal:  Triglycerides (TRIG) less than 150  Goal:  HDL greater than 40  Goal:  LDL (LDLCALC) less than 100   BLOOD PRESSURE American Stroke Association blood pressure target is less that 120/80 mm/Hg  Your discharge blood pressure is:  BP: (!) 180/52(RN notifed)  Monitor your blood pressure  Limit your salt and alcohol intake  Many individuals will require more than one medication for high blood pressure  DIABETES (A1c is a blood sugar average for last 3 months) Goal HGBA1c is under 7% (HBGA1c is blood sugar average  for last 3 months)  Diabetes: No known diagnosis of diabetes    Lab Results  Component Value Date   HGBA1C 5.7 (H) 05/22/2017     Your HGBA1c can be lowered with medications, healthy diet, and exercise.  Check your blood sugar as directed by your physician  Call your physician if you experience unexplained or low blood sugars.  PHYSICAL ACTIVITY/REHABILITATION Goal is 30 minutes at least 4 days per week  Activity: Increase activity slowly, Therapies: Physical Therapy: Home Health Return to work:   Activity decreases your risk of heart attack and stroke and makes your heart stronger.  It helps control your weight and blood pressure; helps you relax and can improve your mood.  Participate in a regular exercise program.  Talk with your doctor about the best form of exercise for you (dancing, walking, swimming, cycling).  DIET/WEIGHT Goal is to maintain a healthy weight  Your discharge diet is: Diet Heart Room service appropriate? Yes; Fluid consistency: Thin  liquids Your height is:  Height: 5\' 8"  (172.7 cm) Your current weight is: Weight: 62.1 kg (137 lb 0.1 oz) Your Body Mass Index (BMI) is:  BMI (Calculated): 20.84  Following the type of diet specifically designed for you will help prevent another stroke.  Your goal weight range is:    Your goal Body Mass Index (BMI) is 19-24.  Healthy food habits can help reduce 3 risk factors for stroke:  High cholesterol, hypertension, and excess weight.  RESOURCES Stroke/Support Group:  Call [phone]   STROKE  EDUCATION PROVIDED/REVIEWED AND GIVEN TO PATIENT Stroke warning signs and symptoms How to activate emergency medical system (call 911). Medications prescribed at discharge. Need for follow-up after discharge. Personal risk factors for stroke. Pneumonia vaccine given:  Flu vaccine given:  My questions have been answered, the writing is legible, and I understand these instructions.  I will adhere to these goals & educational  materials that have been provided to me after my discharge from the hospital.   __ Bathe/dress with assistance ___ Walk Independently    ___ Shower independently ___ Walk with assistance    ___ Shower with assistance ___ No alcohol     ___ Return to work/school ________  COMMUNITY REFERRALS UPON DISCHARGE:   Home Health:   PT     OT     ST     RN     [facility] Phone:  [phone] Medical Equipment/Items Ordered:  18"x18" lightweight wheelchair with basic cushion  Agency/Supplier:  [facility]        Phone:  [phone]  GENERAL COMMUNITY RESOURCES FOR PATIENT/FAMILY: Support Groups:  [facility]                              Meets the second Thursday of each month from 3-4 PM (except for June, July, and August)                              In the dayroom of [facility] on [address]                              For more information, call [phone]  Special Instructions:    My questions have been answered and I understand these instructions. I will adhere to these goals and the provided educational materials after my discharge from the hospital.  Patient/Caregiver Signature _______________________________ Date __________  Clinician Signature _______________________________________ Date __________  Please bring this form and your medication list with you to all your follow-up doctor's appointments.

## 2017-06-10 NOTE — Progress Notes (Signed)
[place] PHYSICAL MEDICINE & REHABILITATION     PROGRESS NOTE  Subjective/Complaints:  Pt seen lying in bed this AM.  She is more alert this AM. She states she slept well overnight.   ROS: Denies CP, SOB, N/V/D.  Objective: Vital Signs: Blood pressure (!) 168/51, pulse (!) 47, temperature 98.9 F (37.2 C), temperature source Oral, resp. rate 16, height 5\' 8"  (1.727 m), weight 62.2 kg (137 lb 0.4 oz), SpO2 98 %. No results found. No results for input(s): WBC, HGB, HCT, PLT in the last 72 hours. Recent Labs    06/08/17 1556 06/09/17 0924  NA 140 139  K 3.9 4.0  CL 106 106  GLUCOSE 129* 108*  BUN 18 22*  CREATININE 1.05* 0.92  CALCIUM 9.0 9.0   CBG (last 3)  No results for input(s): GLUCAP in the last 72 hours.  Wt Readings from Last 3 Encounters:  06/10/17 62.2 kg (137 lb 0.4 oz)  05/27/17 64.6 kg (142 lb 6.7 oz)    Physical Exam:  BP (!) 168/51 (BP Location: Left Arm)   Pulse (!) 47   Temp 98.9 F (37.2 C) (Oral)   Resp 16   Ht 5\' 8"  (1.727 m)   Wt 62.2 kg (137 lb 0.4 oz)   SpO2 98%   BMI 20.83 kg/m  Constitutional: She appears well-developed. Frail  HENT: Normocephalic and atraumatic.  Eyes: EOM are normal. No discharge.  Cardiovascular: +Bradycardic Respiratory: CTA Bilaterally without wheezes or rales. Normal effort  GI: Bowel sounds are normal. Nondistended.. Musculoskeletal: She exhibits no edema or tenderness.  Neurological: Alert.  Follows simple directions.   Expressive language deficits persistent Motor: 4+/5 in Bilateral delt, bi, tri, grip (stable) LLE: 4/5 proximal to distal (improving) RLE: 4/5 proximal to distal  (improving) Skin: Skin is warm and dry.  Psychiatric: Flat.  Assessment/Plan: 1. Functional deficits secondary to left PCA infarction which require 3+ hours per day of interdisciplinary therapy in a comprehensive inpatient rehab setting. Physiatrist is providing close team supervision and 24 hour management of active medical  problems listed below. Physiatrist and rehab team continue to assess barriers to discharge/monitor patient progress toward functional and medical goals.  Function:  Bathing Bathing position   Position: Shower  Bathing parts Body parts bathed by patient: Right arm, Left upper leg, Left arm, Chest, Right upper leg, Abdomen, Front perineal area, Buttocks, Right lower leg, Left lower leg Body parts bathed by helper: Back  Bathing assist Assist Level: Touching or steadying assistance(Pt > 75%)      Upper Body Dressing/Undressing Upper body dressing   What is the patient wearing?: Pull over shirt/dress     Pull over shirt/dress - Perfomed by patient: Thread/unthread right sleeve, Thread/unthread left sleeve, Put head through opening, Pull shirt over trunk Pull over shirt/dress - Perfomed by helper: Thread/unthread right sleeve, Thread/unthread left sleeve, Put head through opening        Upper body assist Assist Level: Supervision or verbal cues      Lower Body Dressing/Undressing Lower body dressing   What is the patient wearing?: Pants   Underwear - Performed by helper: Thread/unthread right underwear leg, Thread/unthread left underwear leg, Pull underwear up/down Pants- Performed by patient: Thread/unthread right pants leg, Thread/unthread left pants leg, Pull pants up/down Pants- Performed by helper: Pull pants up/down Non-skid slipper socks- Performed by patient: Don/doff right sock, Don/doff left sock                    Lower  body assist Assist for lower body dressing: Touching or steadying assistance (Pt > 75%)      Toileting Toileting   Toileting steps completed by patient: Adjust clothing prior to toileting, Performs perineal hygiene, Adjust clothing after toileting Toileting steps completed by helper: Adjust clothing after toileting Toileting Assistive Devices: Grab bar or rail  Toileting assist Assist level: Touching or steadying assistance (Pt.75%)    Transfers Chair/bed transfer   Chair/bed transfer method: Stand pivot Chair/bed transfer assist level: Touching or steadying assistance (Pt > 75%) Chair/bed transfer assistive device: Armrests, Patent attorney     Max distance: 25 Assist level: Touching or steadying assistance (Pt > 75%)   Wheelchair   Type: Manual Max wheelchair distance: 50' Assist Level: Supervision or verbal cues  Cognition Comprehension Comprehension assist level: Understands basic 90% of the time/cues < 10% of the time  Expression Expression assist level: Expresses basic 75 - 89% of the time/requires cueing 10 - 24% of the time. Needs helper to occlude trach/needs to repeat words.  Social Interaction Social Interaction assist level: Interacts appropriately 75 - 89% of the time - Needs redirection for appropriate language or to initiate interaction.  Problem Solving Problem solving assist level: Solves basic 50 - 74% of the time/requires cueing 25 - 49% of the time  Memory Memory assist level: Recognizes or recalls 25 - 49% of the time/requires cueing 50 - 75% of the time    Medical Problem List and Plan: 1.  Aphasia with gait and balance disorder secondary to left PCA infarction   D/c today   Will see patient for transitional care management in 1-2 weeks 2.  DVT Prophylaxis/Anticoagulation: Subcutaneous Lovenox 3. Pain Management: Hydrocodone as needed 4. Mood/memory loss: Provide emotional support. Provide tele sitter for safety   Fluoxetine started on 12/31  5. Neuropsych: This patient is not capable of making decisions on her own behalf. 6. Skin/Wound Care: Routine skin checks 7. Fluids/Electrolytes/Nutrition: Routine I&O's    Remeron 7.5 started on 12/31   Encouraging po intake 8. Hypertension: Lisinopril 40 mg daily   Hydralazine 50 mg every 8 hours, increased to 75 on 12/31   Norvasc increased to 10 mg on 12/30    Extremely labile, will need close ambulatory monitoring 9.  Bradycardia. Attempts at placement of pacemaker 05/21/2017 unsuccessful. Plan to continue to monitor. 10. Constipation. Laxative assistance 11. Hyperlipidemia. Pravachol 12. Prediabetes   Relatively controlled on 1/8    13. Hypoalbuminemia   Supplement initiated on 12/27 14. Acute blood loss anemia   Hemoglobin 10.7 on 1/4   Continue to monitor 15. AKI    Cr. 0.92  on 1/8   Encourage fluid intake   Cont to monitor 16. Bradycardia   Failed pacemaker placement   Evaluated by cardiology, not candidate for PPM at this time 17. Confusion/lethargy   Multifactorial   UCX + for enterococcus faecalis S to macrodantin     Continue to encourage p.o. intake  18.  Hypokalemia resolved on KCL in IVF   K+ 3.9 on 1/7   Cont to monitor 19.  Gr 2 diastolic dysfunction no signs of fluid overload -off IV fluids   LOS (Days) 14 A FACE TO FACE EVALUATION WAS PERFORMED  Ankit Karis Juba 06/10/2017 8:40 AM

## 2017-06-11 ENCOUNTER — Telehealth: Payer: Self-pay | Admitting: *Deleted

## 2017-06-11 DIAGNOSIS — R7303 Prediabetes: Secondary | ICD-10-CM | POA: Diagnosis not present

## 2017-06-11 DIAGNOSIS — I69320 Aphasia following cerebral infarction: Secondary | ICD-10-CM | POA: Diagnosis not present

## 2017-06-11 DIAGNOSIS — I69398 Other sequelae of cerebral infarction: Secondary | ICD-10-CM | POA: Diagnosis not present

## 2017-06-11 DIAGNOSIS — R2689 Other abnormalities of gait and mobility: Secondary | ICD-10-CM | POA: Diagnosis not present

## 2017-06-11 DIAGNOSIS — I10 Essential (primary) hypertension: Secondary | ICD-10-CM | POA: Diagnosis not present

## 2017-06-11 DIAGNOSIS — N390 Urinary tract infection, site not specified: Secondary | ICD-10-CM | POA: Diagnosis not present

## 2017-06-11 NOTE — Telephone Encounter (Signed)
Transitional Care Call Completed,  Appointment confirmed, address confirmed, new patient packet mailed  Transitional Care Questions   Questions for our staff to ask patients on Transitional care 48 hour phone call:   1. Are you/is patient experiencing any problems since coming home?  Patient happy to be home. Are there any questions regarding any aspect of care?  No  2. Are there any questions regarding medications administration/dosing? No  Are meds being taken as prescribed? Yes Patient should review meds with caller to confirm   3. Have there been any falls? Yes, son is being proactive with technology upgrades (monitors, cameras, bedside alarms, motion detector), hypervigilance. Patient fell getting up and trying to ambulate alone.  4. Has Home Health been to the house and/or have they contacted you? Yes, homehealth nurse has completed initial visit. More to come. If not, have you tried to contact them? Can we help you contact them?   5. Are bowels and bladder emptying properly? Reasonably well, some minor instances,  Depends Adult Absorption Pads have been purchased Are there any unexpected incontinence issues? yes If applicable, is patient following bowel/bladder programs?   6. Any fevers, problems with breathing, unexpected pain?  No, No, No  7. Are there any skin problems or new areas of breakdown? Itchy skin, possibly cold dry weather, stale hospital environment?  8. Has the patient/family member arranged specialty MD follow up (ie cardiology/neurology/renal/surgical/etc)? Yes Can we help arrange?  No  9. Does the patient need any other services or support that we can help arrange?  No  10. Are caregivers following through as expected in assisting the patient?  Yes  11. Has the patient quit smoking, drinking alcohol, or using drugs as recommended? No smoke, no drink, no illicit drug use

## 2017-06-15 DIAGNOSIS — I10 Essential (primary) hypertension: Secondary | ICD-10-CM | POA: Diagnosis not present

## 2017-06-15 DIAGNOSIS — I69320 Aphasia following cerebral infarction: Secondary | ICD-10-CM | POA: Diagnosis not present

## 2017-06-15 DIAGNOSIS — R7303 Prediabetes: Secondary | ICD-10-CM | POA: Diagnosis not present

## 2017-06-15 DIAGNOSIS — R2689 Other abnormalities of gait and mobility: Secondary | ICD-10-CM | POA: Diagnosis not present

## 2017-06-15 DIAGNOSIS — I69398 Other sequelae of cerebral infarction: Secondary | ICD-10-CM | POA: Diagnosis not present

## 2017-06-15 DIAGNOSIS — N390 Urinary tract infection, site not specified: Secondary | ICD-10-CM | POA: Diagnosis not present

## 2017-06-16 DIAGNOSIS — I69320 Aphasia following cerebral infarction: Secondary | ICD-10-CM | POA: Diagnosis not present

## 2017-06-16 DIAGNOSIS — N390 Urinary tract infection, site not specified: Secondary | ICD-10-CM | POA: Diagnosis not present

## 2017-06-16 DIAGNOSIS — R7303 Prediabetes: Secondary | ICD-10-CM | POA: Diagnosis not present

## 2017-06-16 DIAGNOSIS — R2689 Other abnormalities of gait and mobility: Secondary | ICD-10-CM | POA: Diagnosis not present

## 2017-06-16 DIAGNOSIS — I69398 Other sequelae of cerebral infarction: Secondary | ICD-10-CM | POA: Diagnosis not present

## 2017-06-16 DIAGNOSIS — I10 Essential (primary) hypertension: Secondary | ICD-10-CM | POA: Diagnosis not present

## 2017-06-17 ENCOUNTER — Telehealth: Payer: Self-pay

## 2017-06-17 ENCOUNTER — Telehealth: Payer: Self-pay | Admitting: *Deleted

## 2017-06-17 DIAGNOSIS — N390 Urinary tract infection, site not specified: Secondary | ICD-10-CM | POA: Diagnosis not present

## 2017-06-17 DIAGNOSIS — I10 Essential (primary) hypertension: Secondary | ICD-10-CM | POA: Diagnosis not present

## 2017-06-17 DIAGNOSIS — I69320 Aphasia following cerebral infarction: Secondary | ICD-10-CM | POA: Diagnosis not present

## 2017-06-17 DIAGNOSIS — I69398 Other sequelae of cerebral infarction: Secondary | ICD-10-CM | POA: Diagnosis not present

## 2017-06-17 DIAGNOSIS — R7303 Prediabetes: Secondary | ICD-10-CM | POA: Diagnosis not present

## 2017-06-17 DIAGNOSIS — R2689 Other abnormalities of gait and mobility: Secondary | ICD-10-CM | POA: Diagnosis not present

## 2017-06-17 NOTE — Telephone Encounter (Signed)
I am aware of her medications and bradycardia.  She was bradycardic in the hospital and due to potential complications, it was felt that pacemaker could not be placed at this time. She was seen by Cardiology, while in the hospital and recommendations were to continue current management, with revisiting pacemake in future. Thanks.

## 2017-06-17 NOTE — Telephone Encounter (Signed)
blane called back and orders were approved.

## 2017-06-17 NOTE — Telephone Encounter (Signed)
I do not recall this as a hospital issue.  She can try priobiotics and maalox to see if they help until she sees her PCP.  If she is still not able to tolerate, she should d/c the medication and follow up with PCP for alternative.  She can try miralax daily for constipation.  Thanks.

## 2017-06-17 NOTE — Telephone Encounter (Signed)
Physical therapist left a message asking for verbal [facility] 2week8. Contacted physical therapist and gave verbal orders per office protocol

## 2017-06-17 NOTE — Telephone Encounter (Signed)
Called patient and relayed information

## 2017-06-17 NOTE — Telephone Encounter (Signed)
Cordelia PenSherry, RN, [facility] calls to report a concern over a series of vitals that have been taken.  At start of care pt's BP was 142/48 with a heart rate of 64, on the 14th of January it was 146/50 with a heart rate of 52 followed by 153/54 with a heart rate of 58, and yesterday it was 128/56 with a heart rate 50.  The BP has gotten better and the pulse seems to have gotten worse. Patient has been feeling weak as of late. She reports patient is prescribed hydralazine 25 mg TID and Norvasc 10 mg daily. She was hoping Dr. Allena KatzPatel would review before visit.   FYI.   Please advise

## 2017-06-17 NOTE — Telephone Encounter (Signed)
Lupita LeashDonna, daughter in law, of patient called stating that she came home from hospital a week and a half ago. She stated that they put patient on new blood pressure medication and the past 3 days the patient has been burping with severe indigestion. She states the patient never has this and that she has been gagging and vomiting. She also stated that the patient hasn't had a bowel movement in 3 days. Please advise.

## 2017-06-17 NOTE — Telephone Encounter (Signed)
Tannin Newman OT [facility] called today requesting verbal orders for OT for 2xwk X 8wks. Called her back, no answer, unsure if it is a secure line according to voicemail, left message to call back to approve orders.

## 2017-06-18 DIAGNOSIS — K5901 Slow transit constipation: Secondary | ICD-10-CM

## 2017-06-18 DIAGNOSIS — E46 Unspecified protein-calorie malnutrition: Secondary | ICD-10-CM

## 2017-06-18 DIAGNOSIS — N390 Urinary tract infection, site not specified: Secondary | ICD-10-CM | POA: Diagnosis not present

## 2017-06-18 DIAGNOSIS — R2689 Other abnormalities of gait and mobility: Secondary | ICD-10-CM | POA: Diagnosis not present

## 2017-06-18 DIAGNOSIS — I69398 Other sequelae of cerebral infarction: Secondary | ICD-10-CM | POA: Diagnosis not present

## 2017-06-18 DIAGNOSIS — E785 Hyperlipidemia, unspecified: Secondary | ICD-10-CM

## 2017-06-18 DIAGNOSIS — R7303 Prediabetes: Secondary | ICD-10-CM | POA: Diagnosis not present

## 2017-06-18 DIAGNOSIS — I10 Essential (primary) hypertension: Secondary | ICD-10-CM | POA: Diagnosis not present

## 2017-06-18 DIAGNOSIS — E8809 Other disorders of plasma-protein metabolism, not elsewhere classified: Secondary | ICD-10-CM

## 2017-06-18 DIAGNOSIS — I441 Atrioventricular block, second degree: Secondary | ICD-10-CM

## 2017-06-18 DIAGNOSIS — I69320 Aphasia following cerebral infarction: Secondary | ICD-10-CM | POA: Diagnosis not present

## 2017-06-18 DIAGNOSIS — R4701 Aphasia: Secondary | ICD-10-CM

## 2017-06-19 ENCOUNTER — Encounter: Payer: Medicare Other | Attending: Physical Medicine & Rehabilitation | Admitting: Physical Medicine & Rehabilitation

## 2017-06-19 ENCOUNTER — Encounter: Payer: Self-pay | Admitting: Physical Medicine & Rehabilitation

## 2017-06-19 ENCOUNTER — Other Ambulatory Visit: Payer: Self-pay

## 2017-06-19 VITALS — BP 163/74 | HR 44

## 2017-06-19 DIAGNOSIS — I69320 Aphasia following cerebral infarction: Secondary | ICD-10-CM | POA: Insufficient documentation

## 2017-06-19 DIAGNOSIS — I10 Essential (primary) hypertension: Secondary | ICD-10-CM | POA: Diagnosis not present

## 2017-06-19 DIAGNOSIS — R110 Nausea: Secondary | ICD-10-CM | POA: Insufficient documentation

## 2017-06-19 DIAGNOSIS — Z9049 Acquired absence of other specified parts of digestive tract: Secondary | ICD-10-CM | POA: Diagnosis not present

## 2017-06-19 DIAGNOSIS — K5901 Slow transit constipation: Secondary | ICD-10-CM | POA: Diagnosis not present

## 2017-06-19 DIAGNOSIS — R413 Other amnesia: Secondary | ICD-10-CM | POA: Insufficient documentation

## 2017-06-19 DIAGNOSIS — Z803 Family history of malignant neoplasm of breast: Secondary | ICD-10-CM | POA: Diagnosis not present

## 2017-06-19 DIAGNOSIS — E639 Nutritional deficiency, unspecified: Secondary | ICD-10-CM

## 2017-06-19 DIAGNOSIS — I63532 Cerebral infarction due to unspecified occlusion or stenosis of left posterior cerebral artery: Secondary | ICD-10-CM | POA: Diagnosis not present

## 2017-06-19 DIAGNOSIS — I6381 Other cerebral infarction due to occlusion or stenosis of small artery: Secondary | ICD-10-CM | POA: Diagnosis not present

## 2017-06-19 DIAGNOSIS — R4701 Aphasia: Secondary | ICD-10-CM | POA: Diagnosis not present

## 2017-06-19 DIAGNOSIS — G479 Sleep disorder, unspecified: Secondary | ICD-10-CM | POA: Diagnosis not present

## 2017-06-19 DIAGNOSIS — Z800 Family history of malignant neoplasm of digestive organs: Secondary | ICD-10-CM | POA: Insufficient documentation

## 2017-06-19 DIAGNOSIS — R0989 Other specified symptoms and signs involving the circulatory and respiratory systems: Secondary | ICD-10-CM

## 2017-06-19 DIAGNOSIS — I69398 Other sequelae of cerebral infarction: Secondary | ICD-10-CM | POA: Insufficient documentation

## 2017-06-19 DIAGNOSIS — Z950 Presence of cardiac pacemaker: Secondary | ICD-10-CM | POA: Diagnosis not present

## 2017-06-19 DIAGNOSIS — Z90710 Acquired absence of both cervix and uterus: Secondary | ICD-10-CM | POA: Insufficient documentation

## 2017-06-19 DIAGNOSIS — R112 Nausea with vomiting, unspecified: Secondary | ICD-10-CM

## 2017-06-19 DIAGNOSIS — R001 Bradycardia, unspecified: Secondary | ICD-10-CM | POA: Diagnosis not present

## 2017-06-19 MED ORDER — MIRTAZAPINE 15 MG PO TABS
15.0000 mg | ORAL_TABLET | Freq: Every day | ORAL | 1 refills | Status: DC
Start: 2017-06-19 — End: 2017-12-29

## 2017-06-19 NOTE — Progress Notes (Signed)
Subjective:    Patient ID: Nicole [name], female    DOB: 1929/05/02, 84 y.o.   MRN: [id number]  HPI 84 year old, right-handed female with history of hypertension presents for transitional care management after receiving CIR for left PCA infarction.  DATE OF ADMISSION:  05/27/2017 DATE OF DISCHARGE:  06/10/2017  Presents with family, who provide all of the history. At discharge, she was instructed to follow up with Neurology, which she has. She does not have an appointment with Cards. She sees PCP next week. She had a good appetite at first, but has not been eating much since.  Her BP remains elevated and HR remains low. Tendency to fall without supervision. She completed course of abx for UTI.  Therapies: 2/week PT/OT/SLP Mobility: Walker at home, wheelchair in community.  DME: Purchased on own.  Pain Inventory Average Pain 3 Pain Right Now 3 My pain is dull  In the last 24 hours, has pain interfered with the following? General activity 5 Relation with others 0 Enjoyment of life 9 What TIME of day is your pain at its worst? morning Sleep (in general) Poor  Pain is worse with: walking Pain improves with: heat/ice Relief from Meds: 0  Mobility use a walker how many minutes can you walk? 5 ability to climb steps?  yes do you drive?  no use a wheelchair needs help with transfers  Function not employed: date last employed . I need assistance with the following:  dressing, bathing, toileting, meal prep, household duties and shopping  Neuro/Psych trouble walking confusion  Prior Studies Any changes since last visit?  no  Physicians involved in your care Any changes since last visit?  no   Family History  Problem Relation Age of Onset  . Cancer Mother        stomach  . Cancer Sister        breast   Social History   Socioeconomic History  . Marital status: Widowed    Spouse name: None  . Number of children: None  . Years of education: None  . Highest  education level: None  Social Needs  . Financial resource strain: None  . Food insecurity - worry: None  . Food insecurity - inability: None  . Transportation needs - medical: None  . Transportation needs - non-medical: None  Occupational History  . None  Tobacco Use  . Smoking status: Never Smoker  . Smokeless tobacco: Never Used  Substance and Sexual Activity  . Alcohol use: None  . Drug use: None  . Sexual activity: None  Other Topics Concern  . None  Social History Narrative  . None   Past Surgical History:  Procedure Laterality Date  . ABDOMINAL HYSTERECTOMY    . CHOLECYSTECTOMY    . PACEMAKER IMPLANT N/A 05/21/2017   Procedure: PACEMAKER IMPLANT;  Surgeon: Marinus Maw, MD;  Location: [facility] INVASIVE CV LAB;  Service: Cardiovascular;  Laterality: N/A;   Past Medical History:  Diagnosis Date  . Hypertension   . Polio    BP (!) 163/74   Pulse (!) 44   SpO2 94%   Opioid Risk Score:  0 Fall Risk Score:  `1  Depression screen PHQ 2/9  Depression screen [facility] 2/9 06/19/2017 06/19/2017  Decreased Interest 0 0  Down, Depressed, Hopeless 3 0  PHQ - 2 Score 3 0  Altered sleeping 3 -  Tired, decreased energy 3 -  Change in appetite 3 -  Feeling bad or failure about yourself  0 -  Trouble concentrating 2 -  Moving slowly or fidgety/restless 2 -  Suicidal thoughts 0 -  PHQ-9 Score 16 -  Difficult doing work/chores Somewhat difficult -     Review of Systems  Constitutional: Positive for unexpected weight change.  HENT: Negative.   Eyes: Negative.   Respiratory: Negative.   Cardiovascular: Negative.   Gastrointestinal: Positive for nausea and vomiting.  Endocrine: Negative.   Genitourinary: Negative.   Musculoskeletal: Positive for gait problem.  Skin: Negative.   Allergic/Immunologic: Negative.   Hematological: Negative.   Psychiatric/Behavioral: Positive for confusion.  All other systems reviewed and are negative. Taken from family.     Objective:    Physical Exam Constitutional: She appears well-developed. Frail  HENT: Normocephalic and atraumatic.  Eyes: EOM are normal. No discharge.  Cardiovascular: +Bradycardia Respiratory: CTA Bilaterally without wheezes or rales. Normal effort  GI: Bowel sounds are normal. Nondistended.. Musculoskeletal: She exhibits no edema or tenderness.  Neurological: Alert and oriented x1.  Follows simple directions.   Expressive language deficits persistent Motor: 4+/5 in Bilateral delt, bi, tri, grip  LLE: 4-4+/5 proximal to distal  RLE: 4-4+/5 proximal to distal Skin: Skin is warm and dry.  Psychiatric: Flat.    Assessment & Plan:  84 year old, right-handed female with history of hypertension presents for transitional care management after receiving CIR for left PCA infarction.  1. 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 Mood/memory loss:   Fluoxetine d/ced by family due to ?side effects  3. Poor appetite  Remeron increased to 15  4. Hypertension  Remains elevated, home recordings reviewed  Encouraged follow up with PCP  Cont meds  5. Bradycardia.   Follow up with Cards  6. Sleep disturbance  Ordered Melatonin  7. Gait abnormality  Cont therapies  Cont wheelchair for safety  8. Nausea  Family would like trail natural, encouraged ginger

## 2017-06-22 DIAGNOSIS — I69320 Aphasia following cerebral infarction: Secondary | ICD-10-CM | POA: Diagnosis not present

## 2017-06-22 DIAGNOSIS — I10 Essential (primary) hypertension: Secondary | ICD-10-CM | POA: Diagnosis not present

## 2017-06-22 DIAGNOSIS — I69398 Other sequelae of cerebral infarction: Secondary | ICD-10-CM | POA: Diagnosis not present

## 2017-06-22 DIAGNOSIS — N390 Urinary tract infection, site not specified: Secondary | ICD-10-CM | POA: Diagnosis not present

## 2017-06-22 DIAGNOSIS — R7303 Prediabetes: Secondary | ICD-10-CM | POA: Diagnosis not present

## 2017-06-22 DIAGNOSIS — R2689 Other abnormalities of gait and mobility: Secondary | ICD-10-CM | POA: Diagnosis not present

## 2017-06-23 DIAGNOSIS — Z09 Encounter for follow-up examination after completed treatment for conditions other than malignant neoplasm: Secondary | ICD-10-CM | POA: Diagnosis not present

## 2017-06-23 DIAGNOSIS — I69398 Other sequelae of cerebral infarction: Secondary | ICD-10-CM | POA: Diagnosis not present

## 2017-06-23 DIAGNOSIS — R2689 Other abnormalities of gait and mobility: Secondary | ICD-10-CM | POA: Diagnosis not present

## 2017-06-23 DIAGNOSIS — I459 Conduction disorder, unspecified: Secondary | ICD-10-CM | POA: Diagnosis not present

## 2017-06-23 DIAGNOSIS — R799 Abnormal finding of blood chemistry, unspecified: Secondary | ICD-10-CM | POA: Diagnosis not present

## 2017-06-23 DIAGNOSIS — I10 Essential (primary) hypertension: Secondary | ICD-10-CM | POA: Diagnosis not present

## 2017-06-23 DIAGNOSIS — I6381 Other cerebral infarction due to occlusion or stenosis of small artery: Secondary | ICD-10-CM | POA: Diagnosis not present

## 2017-06-23 DIAGNOSIS — R7303 Prediabetes: Secondary | ICD-10-CM | POA: Diagnosis not present

## 2017-06-23 DIAGNOSIS — I69320 Aphasia following cerebral infarction: Secondary | ICD-10-CM | POA: Diagnosis not present

## 2017-06-23 DIAGNOSIS — D649 Anemia, unspecified: Secondary | ICD-10-CM | POA: Diagnosis not present

## 2017-06-23 DIAGNOSIS — N390 Urinary tract infection, site not specified: Secondary | ICD-10-CM | POA: Diagnosis not present

## 2017-06-23 DIAGNOSIS — R7309 Other abnormal glucose: Secondary | ICD-10-CM | POA: Diagnosis not present

## 2017-06-24 DIAGNOSIS — R2689 Other abnormalities of gait and mobility: Secondary | ICD-10-CM | POA: Diagnosis not present

## 2017-06-24 DIAGNOSIS — I69320 Aphasia following cerebral infarction: Secondary | ICD-10-CM | POA: Diagnosis not present

## 2017-06-24 DIAGNOSIS — I10 Essential (primary) hypertension: Secondary | ICD-10-CM | POA: Diagnosis not present

## 2017-06-24 DIAGNOSIS — R7303 Prediabetes: Secondary | ICD-10-CM | POA: Diagnosis not present

## 2017-06-24 DIAGNOSIS — N390 Urinary tract infection, site not specified: Secondary | ICD-10-CM | POA: Diagnosis not present

## 2017-06-24 DIAGNOSIS — I69398 Other sequelae of cerebral infarction: Secondary | ICD-10-CM | POA: Diagnosis not present

## 2017-06-25 DIAGNOSIS — N390 Urinary tract infection, site not specified: Secondary | ICD-10-CM | POA: Diagnosis not present

## 2017-06-25 DIAGNOSIS — I10 Essential (primary) hypertension: Secondary | ICD-10-CM | POA: Diagnosis not present

## 2017-06-25 DIAGNOSIS — R2689 Other abnormalities of gait and mobility: Secondary | ICD-10-CM | POA: Diagnosis not present

## 2017-06-25 DIAGNOSIS — R7303 Prediabetes: Secondary | ICD-10-CM | POA: Diagnosis not present

## 2017-06-25 DIAGNOSIS — I69320 Aphasia following cerebral infarction: Secondary | ICD-10-CM | POA: Diagnosis not present

## 2017-06-25 DIAGNOSIS — I69398 Other sequelae of cerebral infarction: Secondary | ICD-10-CM | POA: Diagnosis not present

## 2017-06-26 DIAGNOSIS — R2689 Other abnormalities of gait and mobility: Secondary | ICD-10-CM | POA: Diagnosis not present

## 2017-06-26 DIAGNOSIS — R7303 Prediabetes: Secondary | ICD-10-CM | POA: Diagnosis not present

## 2017-06-26 DIAGNOSIS — N390 Urinary tract infection, site not specified: Secondary | ICD-10-CM | POA: Diagnosis not present

## 2017-06-26 DIAGNOSIS — I69320 Aphasia following cerebral infarction: Secondary | ICD-10-CM | POA: Diagnosis not present

## 2017-06-26 DIAGNOSIS — I69398 Other sequelae of cerebral infarction: Secondary | ICD-10-CM | POA: Diagnosis not present

## 2017-06-26 DIAGNOSIS — I10 Essential (primary) hypertension: Secondary | ICD-10-CM | POA: Diagnosis not present

## 2017-06-28 NOTE — Progress Notes (Signed)
**Note Nicole-Identified via Obfuscation** Cardiology Office Note Date:  06/30/2017  Patient ID:  Nicole [name], DOB 12-Nov-1928, MRN [id number] PCP:  Amelia Joousins, Melissa A, FNP  Cardiologist:  Dr. Ladona Ridgelaylor   Chief Complaint: hospital f/u  History of Present Illness: Nicole [name] is a 84 y.o. female with history of HTN onlywas admitted [facility]17 with complaints/observations of periods of confusion by family, noted she had self stopped her losartan many months prior, on arrival she was found with SBP 200's, and bradycardic with 2:1 AVBlock V rates 40's.    She was seen by EP, Dr. Ladona Ridgelaylor planned for PPM, though unable to sedate and patient unable to tolerate the procedure, PPM implant was abandoned.  She was as well found with CVA.  Mental status continued to deteriorate.  It was not felt that her bradycardia was contirbuting to her lethargy given good BP, AMS, and decided not to pursue PPM.  [facility], EP revisited her case in CIR, planned for out patient follow up.  [facility].  She comes today to be seen for Dr. Ladona Ridgelaylor.  The patient is accompanied by her son, she is in a wheelchair today.  He states when out of the house they use the wheelchair, at home with PT/OT, aid she will ambulate with a walker, but otherwise spends most of her time seataed, and sleeps much of the time.  Her memory/MS waxes/wanes.  [place], does not recall any of her hospitalization.  She is interactive with her family though evenings is more confused generally but clears up some with re-orientation in day time.  He mentions she has moments of clarity but more often is not.   Past Medical History:  Diagnosis Date  . Hypertension   . Polio     Past Surgical History:  Procedure Laterality Date  . ABDOMINAL HYSTERECTOMY    . CHOLECYSTECTOMY    . PACEMAKER IMPLANT N/A 05/21/2017   Procedure: PACEMAKER IMPLANT;  Surgeon: Marinus Mawaylor, Gregg W, MD;  [facility];  Service:  Cardiovascular;  Laterality: N/A;    Current Outpatient Medications  Medication Sig Dispense Refill  . amLODipine (NORVASC) 10 MG tablet Take 1 tablet (10 mg total) by mouth daily. 30 tablet 1  . aspirin 81 MG chewable tablet Chew 1 tablet (81 mg total) by mouth daily. 30 tablet 0  . hydrALAZINE (APRESOLINE) 25 MG tablet Take 3 tablets (75 mg total) by mouth every 8 (eight) hours. 90 tablet 0  . mirtazapine (REMERON) 15 MG tablet Take 1 tablet (15 mg total) by mouth at bedtime. 30 tablet 1  . pravastatin (PRAVACHOL) 40 MG tablet Take 1 tablet (40 mg total) by mouth daily at 6 PM. 30 tablet 0   No current facility-administered medications for this visit.     Allergies:   Patient has no known allergies.   Social History:  The patient  reports that  has never smoked. she has never used smokeless tobacco.   Family History:  The patient's family history includes Cancer in her mother and sister.  ROS:  Please see the history of present illness.  All other systems are reviewed and otherwise negative.   PHYSICAL EXAM:  VS:  BP (!) 134/48   Pulse (!) 48   Ht 5\' 8"  (1.727 m)   Wt 131 lb (59.4 kg)   BMI 19.92 kg/m  BMI: Body mass index is 19.92 kg/m. Well nourished, fvery fail body habitus,  in no  acute distress  HEENT: normocephalic, atraumatic  Neck: no JVD, carotid bruits or masses Cardiac:  RRR; bradycardic, no significant murmurs, no rubs, or gallops Lungs: CTA b/l, no wheezing, rhonchi or rales  Abd: soft, nontender MS: no deformity, advanced atrophy Ext:  no edema  Skin: warm and dry, no rash Neuro: no gross motor deficits, in a wheelchair, moves all extremities, pleasantly confused Psych: plesent affect   EKG:  Done today and reviewed by myself and Dr. Ladona Ridgel, CHB 47bpm, QRS 92ms  05/21/17: TTE Study Conclusions - Left ventricle: The cavity size was normal. There was mild concentric hypertrophy. Systolic function was normal. The estimated ejection fraction was in  the range of 60% to 65%. Wall motion was normal; there were no regional wall motion abnormalities. Features are consistent with a pseudonormal left ventricular filling pattern, with concomitant abnormal relaxation and increased filling pressure (grade 2 diastolic dysfunction). - Aortic valve: Transvalvular velocity was within the normal range. There was no stenosis. There was mild regurgitation. Valve area (Vmax): 1.95 cm^2. - Mitral valve: Transvalvular velocity was within the normal range. There was no evidence for stenosis. There was trivial regurgitation. - Left atrium: The atrium was mildly dilated. - Right ventricle: The cavity size was normal. Wall thickness was normal. Systolic function was normal. - Tricuspid valve: There was mild regurgitation. - Pulmonary arteries: Systolic pressure was moderately increased. PA peak pressure: 55 mm Hg (S).  05/21/17: Conclusion: Unsuccessful insertion of a dual-chamber pacing system secondary to the patient's inability to cooperate for the procedure. The patient will require general anesthesia if she is to undergo pacemaker insertion.    Recent Labs: 05/21/2017: TSH 1.246 05/27/2017: Magnesium 1.7 05/28/2017: ALT 15 06/05/2017: Hemoglobin 10.7; Platelets 273 06/09/2017: BUN 22; Creatinine, Ser 0.92; Potassium 4.0; Sodium 139  05/22/2017: Cholesterol 215; HDL 37; LDL Cholesterol 142; Total CHOL/HDL Ratio 5.8; Triglycerides 179; VLDL 36   CrCl cannot be calculated (Patient's most recent lab result is older than the maximum 21 days allowed.).   Wt Readings from Last 3 Encounters:  06/30/17 131 lb (59.4 kg)  06/10/17 137 lb 0.4 oz (62.2 kg)  05/27/17 142 lb 6.7 oz (64.6 kg)     Other studies reviewed: Additional studies/records reviewed today include: summarized above  ASSESSMENT AND PLAN:  1. Bradycardia, CHB, narrow QRS     Unsuccessful attempt at [facility] implant 2/2 agitation pt unable to tolerate procedure        2, HTN     Looks OK on current regime  3. S/p acute CVA     Slow recovery, remains with AMS, generally it sounds pleasently confused  Dr. Ladona Ridgel saw the patient as well, discussed with her and her son.  Discussed conduction system worsened, though given for the most part sedentary outside of assisted ambulation and particularly her ongoing generally confused state,  Which pacing would not improve, for now, he does not recoommend PPM implant.  He would like to see her in a couple months and reassess again.     Disposition: F/u with Dr.taylor in 2 months, sooner if needed  Current medicines are reviewed at length with the patient today.  The patient did not have any concerns regarding medicines.  Norma Fredrickson, PA-C 06/30/2017 3:08 PM     [facility] [address] [place] [address] [phone] (office)  [phone] (fax)

## 2017-06-30 ENCOUNTER — Ambulatory Visit (INDEPENDENT_AMBULATORY_CARE_PROVIDER_SITE_OTHER): Payer: Medicare Other | Admitting: Physician Assistant

## 2017-06-30 VITALS — BP 134/48 | HR 48 | Ht 68.0 in | Wt 131.0 lb

## 2017-06-30 DIAGNOSIS — I69398 Other sequelae of cerebral infarction: Secondary | ICD-10-CM | POA: Diagnosis not present

## 2017-06-30 DIAGNOSIS — I442 Atrioventricular block, complete: Secondary | ICD-10-CM | POA: Diagnosis not present

## 2017-06-30 DIAGNOSIS — I69320 Aphasia following cerebral infarction: Secondary | ICD-10-CM | POA: Diagnosis not present

## 2017-06-30 DIAGNOSIS — I10 Essential (primary) hypertension: Secondary | ICD-10-CM | POA: Diagnosis not present

## 2017-06-30 DIAGNOSIS — R2689 Other abnormalities of gait and mobility: Secondary | ICD-10-CM | POA: Diagnosis not present

## 2017-06-30 DIAGNOSIS — R7303 Prediabetes: Secondary | ICD-10-CM | POA: Diagnosis not present

## 2017-06-30 DIAGNOSIS — I6381 Other cerebral infarction due to occlusion or stenosis of small artery: Secondary | ICD-10-CM

## 2017-06-30 DIAGNOSIS — N390 Urinary tract infection, site not specified: Secondary | ICD-10-CM | POA: Diagnosis not present

## 2017-06-30 NOTE — Patient Instructions (Signed)
Medication Instructions:   Your physician recommends that you continue on your current medications as directed. Please refer to the Current Medication list given to you today.   If you need a refill on your cardiac medications before your next appointment, please call your pharmacy.  Labwork: NONE ORDERED  TODAY   Testing/Procedures:  NONE ORDERED  TODAY    Follow-Up:   WITH DR Ladona RidgelAYLOR  IN 2 MONTHS ONLY    Any Other Special Instructions Will Be Listed Below (If Applicable).

## 2017-07-01 DIAGNOSIS — I69320 Aphasia following cerebral infarction: Secondary | ICD-10-CM | POA: Diagnosis not present

## 2017-07-01 DIAGNOSIS — N390 Urinary tract infection, site not specified: Secondary | ICD-10-CM | POA: Diagnosis not present

## 2017-07-01 DIAGNOSIS — I10 Essential (primary) hypertension: Secondary | ICD-10-CM | POA: Diagnosis not present

## 2017-07-01 DIAGNOSIS — I69398 Other sequelae of cerebral infarction: Secondary | ICD-10-CM | POA: Diagnosis not present

## 2017-07-01 DIAGNOSIS — R7303 Prediabetes: Secondary | ICD-10-CM | POA: Diagnosis not present

## 2017-07-01 DIAGNOSIS — R2689 Other abnormalities of gait and mobility: Secondary | ICD-10-CM | POA: Diagnosis not present

## 2017-07-02 DIAGNOSIS — I69320 Aphasia following cerebral infarction: Secondary | ICD-10-CM | POA: Diagnosis not present

## 2017-07-02 DIAGNOSIS — I10 Essential (primary) hypertension: Secondary | ICD-10-CM | POA: Diagnosis not present

## 2017-07-02 DIAGNOSIS — N390 Urinary tract infection, site not specified: Secondary | ICD-10-CM | POA: Diagnosis not present

## 2017-07-02 DIAGNOSIS — I69398 Other sequelae of cerebral infarction: Secondary | ICD-10-CM | POA: Diagnosis not present

## 2017-07-02 DIAGNOSIS — R7303 Prediabetes: Secondary | ICD-10-CM | POA: Diagnosis not present

## 2017-07-02 DIAGNOSIS — R2689 Other abnormalities of gait and mobility: Secondary | ICD-10-CM | POA: Diagnosis not present

## 2017-07-06 DIAGNOSIS — R2689 Other abnormalities of gait and mobility: Secondary | ICD-10-CM | POA: Diagnosis not present

## 2017-07-06 DIAGNOSIS — N390 Urinary tract infection, site not specified: Secondary | ICD-10-CM | POA: Diagnosis not present

## 2017-07-06 DIAGNOSIS — I10 Essential (primary) hypertension: Secondary | ICD-10-CM | POA: Diagnosis not present

## 2017-07-06 DIAGNOSIS — I69320 Aphasia following cerebral infarction: Secondary | ICD-10-CM | POA: Diagnosis not present

## 2017-07-06 DIAGNOSIS — I69398 Other sequelae of cerebral infarction: Secondary | ICD-10-CM | POA: Diagnosis not present

## 2017-07-06 DIAGNOSIS — R7303 Prediabetes: Secondary | ICD-10-CM | POA: Diagnosis not present

## 2017-07-07 DIAGNOSIS — R2689 Other abnormalities of gait and mobility: Secondary | ICD-10-CM | POA: Diagnosis not present

## 2017-07-07 DIAGNOSIS — R7303 Prediabetes: Secondary | ICD-10-CM | POA: Diagnosis not present

## 2017-07-07 DIAGNOSIS — N390 Urinary tract infection, site not specified: Secondary | ICD-10-CM | POA: Diagnosis not present

## 2017-07-07 DIAGNOSIS — I69398 Other sequelae of cerebral infarction: Secondary | ICD-10-CM | POA: Diagnosis not present

## 2017-07-07 DIAGNOSIS — I69320 Aphasia following cerebral infarction: Secondary | ICD-10-CM | POA: Diagnosis not present

## 2017-07-07 DIAGNOSIS — I10 Essential (primary) hypertension: Secondary | ICD-10-CM | POA: Diagnosis not present

## 2017-07-08 DIAGNOSIS — N390 Urinary tract infection, site not specified: Secondary | ICD-10-CM | POA: Diagnosis not present

## 2017-07-08 DIAGNOSIS — I10 Essential (primary) hypertension: Secondary | ICD-10-CM | POA: Diagnosis not present

## 2017-07-08 DIAGNOSIS — R2689 Other abnormalities of gait and mobility: Secondary | ICD-10-CM | POA: Diagnosis not present

## 2017-07-08 DIAGNOSIS — L249 Irritant contact dermatitis, unspecified cause: Secondary | ICD-10-CM | POA: Diagnosis not present

## 2017-07-08 DIAGNOSIS — I69320 Aphasia following cerebral infarction: Secondary | ICD-10-CM | POA: Diagnosis not present

## 2017-07-08 DIAGNOSIS — I69398 Other sequelae of cerebral infarction: Secondary | ICD-10-CM | POA: Diagnosis not present

## 2017-07-08 DIAGNOSIS — R7303 Prediabetes: Secondary | ICD-10-CM | POA: Diagnosis not present

## 2017-07-09 DIAGNOSIS — I69398 Other sequelae of cerebral infarction: Secondary | ICD-10-CM | POA: Diagnosis not present

## 2017-07-09 DIAGNOSIS — I69320 Aphasia following cerebral infarction: Secondary | ICD-10-CM | POA: Diagnosis not present

## 2017-07-09 DIAGNOSIS — N390 Urinary tract infection, site not specified: Secondary | ICD-10-CM | POA: Diagnosis not present

## 2017-07-09 DIAGNOSIS — R2689 Other abnormalities of gait and mobility: Secondary | ICD-10-CM | POA: Diagnosis not present

## 2017-07-09 DIAGNOSIS — I10 Essential (primary) hypertension: Secondary | ICD-10-CM | POA: Diagnosis not present

## 2017-07-09 DIAGNOSIS — R7303 Prediabetes: Secondary | ICD-10-CM | POA: Diagnosis not present

## 2017-07-14 DIAGNOSIS — I69320 Aphasia following cerebral infarction: Secondary | ICD-10-CM | POA: Diagnosis not present

## 2017-07-14 DIAGNOSIS — N390 Urinary tract infection, site not specified: Secondary | ICD-10-CM | POA: Diagnosis not present

## 2017-07-14 DIAGNOSIS — R2689 Other abnormalities of gait and mobility: Secondary | ICD-10-CM | POA: Diagnosis not present

## 2017-07-14 DIAGNOSIS — R7303 Prediabetes: Secondary | ICD-10-CM | POA: Diagnosis not present

## 2017-07-14 DIAGNOSIS — I69398 Other sequelae of cerebral infarction: Secondary | ICD-10-CM | POA: Diagnosis not present

## 2017-07-14 DIAGNOSIS — I10 Essential (primary) hypertension: Secondary | ICD-10-CM | POA: Diagnosis not present

## 2017-07-16 ENCOUNTER — Encounter: Payer: Self-pay | Admitting: Physical Medicine & Rehabilitation

## 2017-07-16 ENCOUNTER — Encounter: Payer: Medicare Other | Attending: Physical Medicine & Rehabilitation | Admitting: Physical Medicine & Rehabilitation

## 2017-07-16 VITALS — BP 142/64 | HR 84

## 2017-07-16 DIAGNOSIS — I69320 Aphasia following cerebral infarction: Secondary | ICD-10-CM | POA: Diagnosis not present

## 2017-07-16 DIAGNOSIS — R001 Bradycardia, unspecified: Secondary | ICD-10-CM | POA: Insufficient documentation

## 2017-07-16 DIAGNOSIS — K5901 Slow transit constipation: Secondary | ICD-10-CM

## 2017-07-16 DIAGNOSIS — E639 Nutritional deficiency, unspecified: Secondary | ICD-10-CM | POA: Diagnosis not present

## 2017-07-16 DIAGNOSIS — Z9049 Acquired absence of other specified parts of digestive tract: Secondary | ICD-10-CM | POA: Diagnosis not present

## 2017-07-16 DIAGNOSIS — I69398 Other sequelae of cerebral infarction: Secondary | ICD-10-CM | POA: Insufficient documentation

## 2017-07-16 DIAGNOSIS — Z950 Presence of cardiac pacemaker: Secondary | ICD-10-CM | POA: Insufficient documentation

## 2017-07-16 DIAGNOSIS — R4701 Aphasia: Secondary | ICD-10-CM

## 2017-07-16 DIAGNOSIS — I63532 Cerebral infarction due to unspecified occlusion or stenosis of left posterior cerebral artery: Secondary | ICD-10-CM | POA: Diagnosis not present

## 2017-07-16 DIAGNOSIS — Z800 Family history of malignant neoplasm of digestive organs: Secondary | ICD-10-CM | POA: Insufficient documentation

## 2017-07-16 DIAGNOSIS — N390 Urinary tract infection, site not specified: Secondary | ICD-10-CM | POA: Diagnosis not present

## 2017-07-16 DIAGNOSIS — R110 Nausea: Secondary | ICD-10-CM | POA: Insufficient documentation

## 2017-07-16 DIAGNOSIS — Z803 Family history of malignant neoplasm of breast: Secondary | ICD-10-CM | POA: Insufficient documentation

## 2017-07-16 DIAGNOSIS — Z90710 Acquired absence of both cervix and uterus: Secondary | ICD-10-CM | POA: Diagnosis not present

## 2017-07-16 DIAGNOSIS — R0989 Other specified symptoms and signs involving the circulatory and respiratory systems: Secondary | ICD-10-CM | POA: Diagnosis not present

## 2017-07-16 DIAGNOSIS — I10 Essential (primary) hypertension: Secondary | ICD-10-CM | POA: Diagnosis not present

## 2017-07-16 DIAGNOSIS — I6381 Other cerebral infarction due to occlusion or stenosis of small artery: Secondary | ICD-10-CM | POA: Diagnosis not present

## 2017-07-16 DIAGNOSIS — R7303 Prediabetes: Secondary | ICD-10-CM | POA: Diagnosis not present

## 2017-07-16 DIAGNOSIS — G479 Sleep disorder, unspecified: Secondary | ICD-10-CM | POA: Insufficient documentation

## 2017-07-16 DIAGNOSIS — R413 Other amnesia: Secondary | ICD-10-CM | POA: Diagnosis not present

## 2017-07-16 DIAGNOSIS — R2689 Other abnormalities of gait and mobility: Secondary | ICD-10-CM | POA: Diagnosis not present

## 2017-07-16 NOTE — Progress Notes (Signed)
Subjective:    Patient ID: Nicole [name], female    DOB: 03-11-1929, 84 y.o.   MRN: [id number]  HPI 84 year old, right-handed female with history of hypertension presents for follow up for left PCA infarction.  Last clinic 06/19/17.  History taken from son. Since last visit, pt's son states she is still in therapy. Son states she is alone for a majority of the day, but he has cameras in the room, so they are able to monitor her.  She saw Cardiology, who is not in favor of pacemaker placement at present. She is more alert. Blood pressure better controlled. Appetite remains poor. Sleep has improved. Denies falls. Nausea has resolved.    Pain Inventory Average Pain 0 Pain Right Now 0 My pain is NO PAIN  In the last 24 hours, has pain interfered with the following? General activity 0 Relation with others 0 Enjoyment of life 0 What TIME of day is your pain at its worst? NONE Sleep (in general) Good  Pain is worse with: inactivity Pain improves with: heat/ice Relief from Meds: 0  Mobility use a walker how many minutes can you walk? 10 ability to climb steps?  yes do you drive?  no use a wheelchair needs help with transfers  Function retired I need assistance with the following:  dressing, bathing, toileting, meal prep, household duties and shopping  Neuro/Psych trouble walking confusion  Prior Studies Any changes since last visit?  no  Physicians involved in your care Any changes since last visit?  no   Family History  Problem Relation Age of Onset  . Cancer Mother        stomach  . Cancer Sister        breast   Social History   Socioeconomic History  . Marital status: Widowed    Spouse name: None  . Number of children: None  . Years of education: None  . Highest education level: None  Social Needs  . Financial resource strain: None  . Food insecurity - worry: None  . Food insecurity - inability: None  . Transportation needs - medical: None  .  Transportation needs - non-medical: None  Occupational History  . None  Tobacco Use  . Smoking status: Never Smoker  . Smokeless tobacco: Never Used  Substance and Sexual Activity  . Alcohol use: None  . Drug use: None  . Sexual activity: None  Other Topics Concern  . None  Social History Narrative  . None   Past Surgical History:  Procedure Laterality Date  . ABDOMINAL HYSTERECTOMY    . CHOLECYSTECTOMY    . PACEMAKER IMPLANT N/A 05/21/2017   Procedure: PACEMAKER IMPLANT;  Surgeon: Marinus Maw, MD;  Location: [facility] INVASIVE CV LAB;  Service: Cardiovascular;  Laterality: N/A;   Past Medical History:  Diagnosis Date  . Hypertension   . Polio    BP (!) 142/64   Pulse 84   SpO2 95%   Opioid Risk Score:  0 Fall Risk Score:  `1  Depression screen PHQ 2/9  Depression screen [facility] 2/9 06/19/2017 06/19/2017  Decreased Interest 0 0  Down, Depressed, Hopeless 3 0  PHQ - 2 Score 3 0  Altered sleeping 3 -  Tired, decreased energy 3 -  Change in appetite 3 -  Feeling bad or failure about yourself  0 -  Trouble concentrating 2 -  Moving slowly or fidgety/restless 2 -  Suicidal thoughts 0 -  PHQ-9 Score 16 -  Difficult doing work/chores Somewhat  difficult -     Review of Systems  Constitutional: Positive for appetite change and unexpected weight change.  HENT: Negative.   Eyes: Negative.   Respiratory: Negative.   Cardiovascular: Negative.   Endocrine: Negative.   Genitourinary: Negative.   Musculoskeletal: Positive for gait problem.  Skin: Positive for rash.  Allergic/Immunologic: Negative.   Hematological: Negative.   Psychiatric/Behavioral: Positive for confusion.  All other systems reviewed and are negative. Taken from family.     Objective:   Physical Exam Constitutional: She appears well-developed. Frail  HENT: Normocephalic and atraumatic.  Eyes: EOM are normal. No discharge.  Cardiovascular: RRR. No JVD. Respiratory: CTA Bilaterally. Normal effort  GI:  Bowel sounds are normal. Nondistended.. Musculoskeletal: She exhibits no edema or tenderness.  Neurological: Alert and oriented x1 (improving).  Follows simple directions.   Expressive language deficits persistent Motor: 4+-5/5 in Bilateral delt, bi, tri, grip  LLE: 4+/5 proximal to distal  RLE: 4+/5 proximal to distal Skin: Skin is warm and dry.  Psychiatric: Flat.    Assessment & Plan:  84 year old, right-handed female with history of hypertension presents for follow up for left PCA infarction.  1. 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 Mood/memory loss/Dementia:   Improved slightly  3. Poor appetite  Encouraged to wean Remeron  Improved  4. Hypertension  Remains slightly elevated, overall improved  5. Bradycardia.   Cont follow up with Cards, no plans for pacemake at present  6. Sleep disturbance  Cont meds, improved  7. Gait abnormality  Cont therapies  Cont wheelchair for safety in community  Walker at home

## 2017-07-17 DIAGNOSIS — R2689 Other abnormalities of gait and mobility: Secondary | ICD-10-CM | POA: Diagnosis not present

## 2017-07-17 DIAGNOSIS — I69320 Aphasia following cerebral infarction: Secondary | ICD-10-CM | POA: Diagnosis not present

## 2017-07-17 DIAGNOSIS — I69398 Other sequelae of cerebral infarction: Secondary | ICD-10-CM | POA: Diagnosis not present

## 2017-07-17 DIAGNOSIS — N390 Urinary tract infection, site not specified: Secondary | ICD-10-CM | POA: Diagnosis not present

## 2017-07-17 DIAGNOSIS — I10 Essential (primary) hypertension: Secondary | ICD-10-CM | POA: Diagnosis not present

## 2017-07-17 DIAGNOSIS — R7303 Prediabetes: Secondary | ICD-10-CM | POA: Diagnosis not present

## 2017-07-21 ENCOUNTER — Telehealth: Payer: Self-pay | Admitting: Internal Medicine

## 2017-07-21 DIAGNOSIS — R7303 Prediabetes: Secondary | ICD-10-CM | POA: Diagnosis not present

## 2017-07-21 DIAGNOSIS — I69398 Other sequelae of cerebral infarction: Secondary | ICD-10-CM | POA: Diagnosis not present

## 2017-07-21 DIAGNOSIS — I10 Essential (primary) hypertension: Secondary | ICD-10-CM | POA: Diagnosis not present

## 2017-07-21 DIAGNOSIS — I69320 Aphasia following cerebral infarction: Secondary | ICD-10-CM | POA: Diagnosis not present

## 2017-07-21 DIAGNOSIS — N390 Urinary tract infection, site not specified: Secondary | ICD-10-CM | POA: Diagnosis not present

## 2017-07-21 DIAGNOSIS — R2689 Other abnormalities of gait and mobility: Secondary | ICD-10-CM | POA: Diagnosis not present

## 2017-07-21 NOTE — Telephone Encounter (Signed)
Will route to Dr. Ladona Ridgelaylor and his nurse for review and advisement.

## 2017-07-21 NOTE — Telephone Encounter (Signed)
New Message     Needs parameters for patients vital sound.  Her pulse is staying between 50-58  bp range is 138-150/50-60   What is her expected vital sign, when should they notify you if she gets out of a expected range?

## 2017-07-23 DIAGNOSIS — I10 Essential (primary) hypertension: Secondary | ICD-10-CM | POA: Diagnosis not present

## 2017-07-23 DIAGNOSIS — R2689 Other abnormalities of gait and mobility: Secondary | ICD-10-CM | POA: Diagnosis not present

## 2017-07-23 DIAGNOSIS — I69398 Other sequelae of cerebral infarction: Secondary | ICD-10-CM | POA: Diagnosis not present

## 2017-07-23 DIAGNOSIS — R7303 Prediabetes: Secondary | ICD-10-CM | POA: Diagnosis not present

## 2017-07-23 DIAGNOSIS — N390 Urinary tract infection, site not specified: Secondary | ICD-10-CM | POA: Diagnosis not present

## 2017-07-23 DIAGNOSIS — I69320 Aphasia following cerebral infarction: Secondary | ICD-10-CM | POA: Diagnosis not present

## 2017-07-24 DIAGNOSIS — I69320 Aphasia following cerebral infarction: Secondary | ICD-10-CM | POA: Diagnosis not present

## 2017-07-24 DIAGNOSIS — N390 Urinary tract infection, site not specified: Secondary | ICD-10-CM | POA: Diagnosis not present

## 2017-07-24 DIAGNOSIS — I10 Essential (primary) hypertension: Secondary | ICD-10-CM | POA: Diagnosis not present

## 2017-07-24 DIAGNOSIS — R2689 Other abnormalities of gait and mobility: Secondary | ICD-10-CM | POA: Diagnosis not present

## 2017-07-24 DIAGNOSIS — R7303 Prediabetes: Secondary | ICD-10-CM | POA: Diagnosis not present

## 2017-07-24 DIAGNOSIS — I69398 Other sequelae of cerebral infarction: Secondary | ICD-10-CM | POA: Diagnosis not present

## 2017-07-24 NOTE — Telephone Encounter (Signed)
Left message for Arline AspCindy with [facility].  Notified that per Dr. Prentice Dockeraylor Cindy should call our office if Pt HR is less than 35.  Left this nurse name and # for call back if any questions.

## 2017-07-28 DIAGNOSIS — R2689 Other abnormalities of gait and mobility: Secondary | ICD-10-CM | POA: Diagnosis not present

## 2017-07-28 DIAGNOSIS — R7303 Prediabetes: Secondary | ICD-10-CM | POA: Diagnosis not present

## 2017-07-28 DIAGNOSIS — I10 Essential (primary) hypertension: Secondary | ICD-10-CM | POA: Diagnosis not present

## 2017-07-28 DIAGNOSIS — I69320 Aphasia following cerebral infarction: Secondary | ICD-10-CM | POA: Diagnosis not present

## 2017-07-28 DIAGNOSIS — N390 Urinary tract infection, site not specified: Secondary | ICD-10-CM | POA: Diagnosis not present

## 2017-07-28 DIAGNOSIS — I69398 Other sequelae of cerebral infarction: Secondary | ICD-10-CM | POA: Diagnosis not present

## 2017-07-29 DIAGNOSIS — I69320 Aphasia following cerebral infarction: Secondary | ICD-10-CM | POA: Diagnosis not present

## 2017-07-29 DIAGNOSIS — N390 Urinary tract infection, site not specified: Secondary | ICD-10-CM | POA: Diagnosis not present

## 2017-07-29 DIAGNOSIS — I10 Essential (primary) hypertension: Secondary | ICD-10-CM | POA: Diagnosis not present

## 2017-07-29 DIAGNOSIS — R2689 Other abnormalities of gait and mobility: Secondary | ICD-10-CM | POA: Diagnosis not present

## 2017-07-29 DIAGNOSIS — I69398 Other sequelae of cerebral infarction: Secondary | ICD-10-CM | POA: Diagnosis not present

## 2017-07-29 DIAGNOSIS — R7303 Prediabetes: Secondary | ICD-10-CM | POA: Diagnosis not present

## 2017-07-30 DIAGNOSIS — I69398 Other sequelae of cerebral infarction: Secondary | ICD-10-CM | POA: Diagnosis not present

## 2017-07-30 DIAGNOSIS — I10 Essential (primary) hypertension: Secondary | ICD-10-CM | POA: Diagnosis not present

## 2017-07-30 DIAGNOSIS — R7303 Prediabetes: Secondary | ICD-10-CM | POA: Diagnosis not present

## 2017-07-30 DIAGNOSIS — R2689 Other abnormalities of gait and mobility: Secondary | ICD-10-CM | POA: Diagnosis not present

## 2017-07-30 DIAGNOSIS — I69320 Aphasia following cerebral infarction: Secondary | ICD-10-CM | POA: Diagnosis not present

## 2017-07-30 DIAGNOSIS — N390 Urinary tract infection, site not specified: Secondary | ICD-10-CM | POA: Diagnosis not present

## 2017-07-31 DIAGNOSIS — R2689 Other abnormalities of gait and mobility: Secondary | ICD-10-CM | POA: Diagnosis not present

## 2017-07-31 DIAGNOSIS — R7303 Prediabetes: Secondary | ICD-10-CM | POA: Diagnosis not present

## 2017-07-31 DIAGNOSIS — N390 Urinary tract infection, site not specified: Secondary | ICD-10-CM | POA: Diagnosis not present

## 2017-07-31 DIAGNOSIS — I10 Essential (primary) hypertension: Secondary | ICD-10-CM | POA: Diagnosis not present

## 2017-07-31 DIAGNOSIS — I69398 Other sequelae of cerebral infarction: Secondary | ICD-10-CM | POA: Diagnosis not present

## 2017-07-31 DIAGNOSIS — I69320 Aphasia following cerebral infarction: Secondary | ICD-10-CM | POA: Diagnosis not present

## 2017-08-03 DIAGNOSIS — I69398 Other sequelae of cerebral infarction: Secondary | ICD-10-CM | POA: Diagnosis not present

## 2017-08-03 DIAGNOSIS — N390 Urinary tract infection, site not specified: Secondary | ICD-10-CM | POA: Diagnosis not present

## 2017-08-03 DIAGNOSIS — R7303 Prediabetes: Secondary | ICD-10-CM | POA: Diagnosis not present

## 2017-08-03 DIAGNOSIS — I69320 Aphasia following cerebral infarction: Secondary | ICD-10-CM | POA: Diagnosis not present

## 2017-08-03 DIAGNOSIS — I10 Essential (primary) hypertension: Secondary | ICD-10-CM | POA: Diagnosis not present

## 2017-08-03 DIAGNOSIS — R2689 Other abnormalities of gait and mobility: Secondary | ICD-10-CM | POA: Diagnosis not present

## 2017-08-04 DIAGNOSIS — I69398 Other sequelae of cerebral infarction: Secondary | ICD-10-CM | POA: Diagnosis not present

## 2017-08-04 DIAGNOSIS — I69320 Aphasia following cerebral infarction: Secondary | ICD-10-CM | POA: Diagnosis not present

## 2017-08-04 DIAGNOSIS — I10 Essential (primary) hypertension: Secondary | ICD-10-CM | POA: Diagnosis not present

## 2017-08-04 DIAGNOSIS — R2689 Other abnormalities of gait and mobility: Secondary | ICD-10-CM | POA: Diagnosis not present

## 2017-08-04 DIAGNOSIS — R7303 Prediabetes: Secondary | ICD-10-CM | POA: Diagnosis not present

## 2017-08-04 DIAGNOSIS — N390 Urinary tract infection, site not specified: Secondary | ICD-10-CM | POA: Diagnosis not present

## 2017-08-07 NOTE — Progress Notes (Signed)
[facility]  PATIENT: Nicole [name] DOB: 05/15/1929   REASON FOR VISIT: Follow-up for left superior mesial thalmic infarct, likely secondary to small vessel,  HISTORY FROM: Patient and son Alinda Moneyony    HISTORY OF PRESENT ILLNESS:FROM RECORDHer sons are at the bedside.  She is lying in bed without distress. She still confused and not orientated, no focal neuro deficit. MRI showed left thalamic infarct. Son denies hx of dementia, and stated that her confusion happened sharply around 8 days ago Interval history 3/11/19CM  Ms.Nicole [name], 84 year old female returns for follow-up with history of left superior mesial thalmic infarct likely secondary to small vessel disease.  MRA bilateral ACA and PCA stenosis.  Carotid Doppler unremarkable.  2D echo 60-65%.  LDL 142 hemoglobin A1c 5.7 patient was not on anti-thrombotic prior to admission now on aspirin 81 daily.  So far she has not gotten a pacemaker records blood pressure and pulse every day.  Blood pressures range from 103/43-140 /53.  Pulse range consistently in the 40s.  She was supposed to start on pravastatin at discharge however she was never given a prescription.  Son and patient made aware this is for her LDL cholesterol which is high at 142.  B12 TSH ammonia and RPR are all negative.  EEG was abnormal due to background slowing this is very nonspecific and could be due to toxic metabolic diffuse or multifocal structural processes.  Patient complains of fatigue.  She has good and bad days.  She is following up with cardiology.  Her primary [facility].  She returns for reevaluation   REVIEW OF SYSTEMS: Full 14 system review of systems performed and notable only for those listed, all others are neg:  Constitutional: neg  Cardiovascular: neg Ear/Nose/Throat: neg  Skin: neg Eyes: neg Respiratory: neg Gastroitestinal: neg  Hematology/Lymphatic: neg  Endocrine: neg Musculoskeletal: Walking difficulty,  patient has a history of polio Allergy/Immunology: neg Neurological: Memory loss Psychiatric: neg Sleep : neg   ALLERGIES: No Known Allergies  HOME MEDICATIONS: Outpatient Medications Prior to Visit  Medication Sig Dispense Refill  . amLODipine (NORVASC) 10 MG tablet Take 1 tablet (10 mg total) by mouth daily. 30 tablet 1  . aspirin 81 MG chewable tablet Chew 1 tablet (81 mg total) by mouth daily. 30 tablet 0  . hydrALAZINE (APRESOLINE) 25 MG tablet Take 3 tablets (75 mg total) by mouth every 8 (eight) hours. 90 tablet 0  . mirtazapine (REMERON) 15 MG tablet Take 1 tablet (15 mg total) by mouth at bedtime. 30 tablet 1   No facility-administered medications prior to visit.     PAST MEDICAL HISTORY: Past Medical History:  Diagnosis Date  . Hypertension   . Polio     PAST SURGICAL HISTORY: Past Surgical History:  Procedure Laterality Date  . ABDOMINAL HYSTERECTOMY    . CHOLECYSTECTOMY    . PACEMAKER IMPLANT N/A 05/21/2017   Procedure: PACEMAKER IMPLANT;  Surgeon: Marinus Mawaylor, Gregg W, MD;  [facility];  Service: Cardiovascular;  Laterality: N/A;    FAMILY HISTORY: Family History  Problem Relation Age of Onset  . Cancer Mother        stomach  . Cancer Sister        breast    SOCIAL HISTORY: Social History   Socioeconomic History  . Marital status: Widowed    Spouse name: Not on file  . Number of children: Not on file  . Years of education: Not on file  .  Highest education level: Not on file  Social Needs  . Financial resource strain: Not on file  . Food insecurity - worry: Not on file  . Food insecurity - inability: Not on file  . Transportation needs - medical: Not on file  . Transportation needs - non-medical: Not on file  Occupational History  . Not on file  Tobacco Use  . Smoking status: Never Smoker  . Smokeless tobacco: Never Used  Substance and Sexual Activity  . Alcohol use: Not on file  . Drug use: Not on file  . Sexual activity:  Not on file  Other Topics Concern  . Not on file  Social History Narrative  . Not on file     PHYSICAL EXAM  Vitals:   08/10/17 1027  BP: (!) 138/54  Pulse: (!) 50   There is no height or weight on file to calculate BMI.  Generalized: Well developed, in no acute distress, well groomed Head: normocephalic and atraumatic,. Oropharynx benign  Neck: Supple, no carotid bruits  Cardiac: Regular rate rhythm, no murmur  Musculoskeletal: No deformity   Neurological examination   Mentation: Alert not oriented to time, place. History taking per son. Attention span and concentration appropriate.  Speech with paraphasic errors able to repeat simple phrases but not able to name.  Follows simple commands.  Cranial nerve II-XII: Pupils were equal round reactive to light extraocular movements were full, visual field were full on confrontational test. Facial sensation and strength were normal. hearing was intact to finger rubbing bilaterally. Uvula tongue midline. head turning and shoulder shrug were normal and symmetric.Tongue protrusion into cheek strength was normal. Motor: normal bulk and tone, full strength in the BUE, BLE, fine finger movements normal, no pronator drift. No focal weakness Sensory: normal and symmetric to light touch, pinprick, and  Vibration, in the upper and lower extremities Coordination: finger-nose-finger, heel-to-shin bilaterally, no dysmetria, no tremor Reflexes: 1+ upper lower and symmetric, plantar responses were flexor bilaterally. Gait and Station: Deferred due to safety reasons in wheelchair DIAGNOSTIC DATA (LABS, IMAGING, TESTING) - I reviewed patient records, labs, notes, testing and imaging myself where available.  Lab Results  Component Value Date   WBC 8.9 06/05/2017   HGB 10.7 (L) 06/05/2017   HCT 35.0 (L) 06/05/2017   MCV 89.7 06/05/2017   PLT 273 06/05/2017      Component Value Date/Time   NA 139 06/09/2017 0924   K 4.0 06/09/2017 0924   CL 106  06/09/2017 0924   CO2 24 06/09/2017 0924   GLUCOSE 108 (H) 06/09/2017 0924   BUN 22 (H) 06/09/2017 0924   CREATININE 0.92 06/09/2017 0924   CALCIUM 9.0 06/09/2017 0924   PROT 5.4 (L) 05/28/2017 0517   ALBUMIN 3.0 (L) 05/28/2017 0517   AST 22 05/28/2017 0517   ALT 15 05/28/2017 0517   ALKPHOS 57 05/28/2017 0517   BILITOT 0.6 05/28/2017 0517   GFRNONAA 55 (L) 06/09/2017 0924   GFRAA >60 06/09/2017 0924   Lab Results  Component Value Date   CHOL 215 (H) 05/22/2017   HDL 37 (L) 05/22/2017   LDLCALC 142 (H) 05/22/2017   TRIG 179 (H) 05/22/2017   CHOLHDL 5.8 05/22/2017   Lab Results  Component Value Date   HGBA1C 5.7 (H) 05/22/2017   Lab Results  Component Value Date   VITAMINB12 488 05/21/2017   Lab Results  Component Value Date   TSH 1.246 05/21/2017      ASSESSMENT AND PLAN  84 y.o. year old  female  has a past medical history of Hypertension and Polio. here for hospital follow-up for stroke. No tPA given due to out of window.  MRI of the brain left superior mesial thalmic infarct likely secondary to small vessel disease.  MRA bilateral ACA and PCA stenosis.  Carotid Doppler unremarkable.  2D echo 60-65%.  LDL 142 hemoglobin A1c 5.7 patient was not on anti thrombotic  prior to admission now on aspirin 81 daily. The patient is a current patient of Dr. Roda Shutters  who is out of the office today . This note is sent to the work in doctor.     PLAN: Stressed the importance of management of risk factors to prevent further stroke Continue aspirin for secondary stroke prevention Maintain strict control of hypertension with blood pressure goal below 130/90, today's reading 138/54 Pulse 50. continue antihypertensive medications Control of diabetes with hemoglobin A1c below 6.5 followed by primary care most recent hemoglobin A1c 5.7 Cholesterol with LDL cholesterol less than 70, followed by primary care,  most recent 142  Start pravastatin 40 mg daily per discharge summary RX sent to  pharmacy Exercise by walking, with assistive device in the home   eat healthy diet with whole grains,  fresh fruits and vegetables Follow-up with primary care for stroke risk factor modification, maintain blood pressure goal less than 130 systolic, diabetes with A1c below 7, lipids with LDL below 70 Continue follow-up with cardiology for bradycardia pacemaker on hold F/U in 6 months Discussed risk for recurrent stroke/ TIA and answered additional questions This was a prolonged visit requiring 45 minutes and medical decision making of high complexity with extensive review of history, hospital chart, counseling and answering questions Nilda Riggs, [facility], [facility], APRN  [facility] [address] [place], [place] [address] [phone]

## 2017-08-08 ENCOUNTER — Other Ambulatory Visit: Payer: Self-pay | Admitting: Physical Medicine & Rehabilitation

## 2017-08-08 DIAGNOSIS — R7303 Prediabetes: Secondary | ICD-10-CM | POA: Diagnosis not present

## 2017-08-08 DIAGNOSIS — N390 Urinary tract infection, site not specified: Secondary | ICD-10-CM | POA: Diagnosis not present

## 2017-08-08 DIAGNOSIS — I69320 Aphasia following cerebral infarction: Secondary | ICD-10-CM | POA: Diagnosis not present

## 2017-08-08 DIAGNOSIS — I69398 Other sequelae of cerebral infarction: Secondary | ICD-10-CM | POA: Diagnosis not present

## 2017-08-08 DIAGNOSIS — I10 Essential (primary) hypertension: Secondary | ICD-10-CM | POA: Diagnosis not present

## 2017-08-08 DIAGNOSIS — R2689 Other abnormalities of gait and mobility: Secondary | ICD-10-CM | POA: Diagnosis not present

## 2017-08-10 ENCOUNTER — Ambulatory Visit (INDEPENDENT_AMBULATORY_CARE_PROVIDER_SITE_OTHER): Payer: Medicare Other | Admitting: Nurse Practitioner

## 2017-08-10 ENCOUNTER — Encounter: Payer: Self-pay | Admitting: Nurse Practitioner

## 2017-08-10 VITALS — BP 138/54 | HR 50

## 2017-08-10 DIAGNOSIS — E785 Hyperlipidemia, unspecified: Secondary | ICD-10-CM

## 2017-08-10 DIAGNOSIS — R413 Other amnesia: Secondary | ICD-10-CM

## 2017-08-10 DIAGNOSIS — I10 Essential (primary) hypertension: Secondary | ICD-10-CM | POA: Diagnosis not present

## 2017-08-10 DIAGNOSIS — I6330 Cerebral infarction due to thrombosis of unspecified cerebral artery: Secondary | ICD-10-CM | POA: Diagnosis not present

## 2017-08-10 MED ORDER — PRAVASTATIN SODIUM 40 MG PO TABS: 40.0000 mg | ORAL_TABLET | Freq: Every day | ORAL | 6 refills | Status: DC

## 2017-08-10 NOTE — Telephone Encounter (Signed)
No refill needed.  Thanks.

## 2017-08-10 NOTE — Patient Instructions (Signed)
Stressed the importance of management of risk factors to prevent further stroke Continue aspirin for secondary stroke prevention Maintain strict control of hypertension with blood pressure goal below 130/90, today's reading 138/54 Pulse 50. continue antihypertensive medications Control of diabetes with hemoglobin A1c below 6.5 followed by primary care most recent hemoglobin A1c 5.7 Cholesterol with LDL cholesterol less than 70, followed by primary care,  most recent 142  Start pravastatin 40 mg daily per discharge summary Exercise by walking, with assistive device   eat healthy diet with whole grains,  fresh fruits and vegetables Follow-up with primary care for stroke risk factor modification, maintain blood pressure goal less than 130 systolic, diabetes with A1c below 7, lipids with LDL below 70 Continue follow-up with cardiology for bradycardia F/U in 6 months

## 2017-08-10 NOTE — Progress Notes (Signed)
I have read the note, and I agree with the clinical assessment and plan.  Nataniel Gasper A. Carleigh Buccieri, MD, PhD, FAAN Certified in Neurology, Clinical Neurophysiology, Sleep Medicine, Pain Medicine and Neuroimaging  [facility] [address], [address] [place], [place] [address] [phone]  

## 2017-08-10 NOTE — Telephone Encounter (Signed)
Recieved electronic medication refill request for remeron, last note stated:  3. Poor appetite             Encouraged to wean Remeron             Improved  Not sure how to refill this medication with current information.  Please advise.

## 2017-08-26 DIAGNOSIS — R630 Anorexia: Secondary | ICD-10-CM | POA: Diagnosis not present

## 2017-08-26 DIAGNOSIS — I10 Essential (primary) hypertension: Secondary | ICD-10-CM | POA: Diagnosis not present

## 2017-08-26 DIAGNOSIS — E785 Hyperlipidemia, unspecified: Secondary | ICD-10-CM | POA: Diagnosis not present

## 2017-08-26 DIAGNOSIS — L299 Pruritus, unspecified: Secondary | ICD-10-CM | POA: Diagnosis not present

## 2017-08-26 DIAGNOSIS — I441 Atrioventricular block, second degree: Secondary | ICD-10-CM | POA: Diagnosis not present

## 2017-08-26 DIAGNOSIS — R410 Disorientation, unspecified: Secondary | ICD-10-CM | POA: Diagnosis not present

## 2017-08-27 DIAGNOSIS — R410 Disorientation, unspecified: Secondary | ICD-10-CM | POA: Diagnosis not present

## 2017-08-27 DIAGNOSIS — E785 Hyperlipidemia, unspecified: Secondary | ICD-10-CM | POA: Diagnosis not present

## 2017-08-27 DIAGNOSIS — I10 Essential (primary) hypertension: Secondary | ICD-10-CM | POA: Diagnosis not present

## 2017-08-28 ENCOUNTER — Encounter: Payer: Self-pay | Admitting: Internal Medicine

## 2017-08-28 ENCOUNTER — Ambulatory Visit (INDEPENDENT_AMBULATORY_CARE_PROVIDER_SITE_OTHER): Payer: Medicare Other | Admitting: Internal Medicine

## 2017-08-28 VITALS — BP 130/50 | HR 45 | Ht 65.0 in | Wt 123.0 lb

## 2017-08-28 DIAGNOSIS — I442 Atrioventricular block, complete: Secondary | ICD-10-CM | POA: Diagnosis not present

## 2017-08-28 DIAGNOSIS — I6330 Cerebral infarction due to thrombosis of unspecified cerebral artery: Secondary | ICD-10-CM | POA: Diagnosis not present

## 2017-08-28 DIAGNOSIS — I10 Essential (primary) hypertension: Secondary | ICD-10-CM | POA: Diagnosis not present

## 2017-08-28 MED ORDER — LISINOPRIL 5 MG PO TABS: 5.0000 mg | ORAL_TABLET | Freq: Every day | ORAL | 3 refills | Status: AC

## 2017-08-28 NOTE — Progress Notes (Signed)
HPI Mrs. Nary returns today for ongoing evaluation and management of CHB. The patient sustained a stroke several months ago. She has had worsening memory and dementia symptoms, especially at night. She has not had syncope and she does not c/o sob or chest pain, or dizziness.  No Known Allergies   Current Outpatient Medications  Medication Sig Dispense Refill  . amLODipine (NORVASC) 10 MG tablet Take 1 tablet (10 mg total) by mouth daily. 30 tablet 1  . aspirin 81 MG chewable tablet Chew 1 tablet (81 mg total) by mouth daily. 30 tablet 0  . hydrALAZINE (APRESOLINE) 25 MG tablet Take 3 tablets (75 mg total) by mouth every 8 (eight) hours. 90 tablet 0  . mirtazapine (REMERON) 15 MG tablet Take 1 tablet (15 mg total) by mouth at bedtime. 30 tablet 1  . pravastatin (PRAVACHOL) 40 MG tablet Take 1 tablet (40 mg total) by mouth daily. 30 tablet 6   No current facility-administered medications for this visit.      Past Medical History:  Diagnosis Date  . Hypertension   . Polio     ROS:   All systems reviewed and negative except as noted in the HPI.   Past Surgical History:  Procedure Laterality Date  . ABDOMINAL HYSTERECTOMY    . CHOLECYSTECTOMY    . PACEMAKER IMPLANT N/A 05/21/2017   Procedure: PACEMAKER IMPLANT;  Surgeon: Marinus Maw, MD;  Location: [facility] INVASIVE CV LAB;  Service: Cardiovascular;  Laterality: N/A;     Family History  Problem Relation Age of Onset  . Cancer Mother        stomach  . Cancer Sister        breast     Social History   Socioeconomic History  . Marital status: Widowed    Spouse name: Not on file  . Number of children: Not on file  . Years of education: Not on file  . Highest education level: Not on file  Occupational History  . Not on file  Social Needs  . Financial resource strain: Not on file  . Food insecurity:    Worry: Not on file    Inability: Not on file  . Transportation needs:    Medical: Not on file   Non-medical: Not on file  Tobacco Use  . Smoking status: Never Smoker  . Smokeless tobacco: Never Used  Substance and Sexual Activity  . Alcohol use: Not on file  . Drug use: Not on file  . Sexual activity: Not on file  Lifestyle  . Physical activity:    Days per week: Not on file    Minutes per session: Not on file  . Stress: Not on file  Relationships  . Social connections:    Talks on phone: Not on file    Gets together: Not on file    Attends religious service: Not on file    Active member of club or organization: Not on file    Attends meetings of clubs or organizations: Not on file    Relationship status: Not on file  . Intimate partner violence:    Fear of current or ex partner: Not on file    Emotionally abused: Not on file    Physically abused: Not on file    Forced sexual activity: Not on file  Other Topics Concern  . Not on file  Social History Narrative  . Not on file     BP (!) 130/50   Pulse Marland Kitchen)  45   Ht 5\' 5"  (1.651 m)   Wt 123 lb (55.8 kg)   SpO2 96%   BMI 20.47 kg/m   Physical Exam:  Well appearing 84 yo woman, NAD HEENT: Unremarkable Neck:  6 cm JVD, no thyromegally Lymphatics:  No adenopathy Back:  No CVA tenderness Lungs:  Clear with no wheezes HEART:  Regular rate rhythm, she was not bradycardic on my exam, no murmurs, no rubs, no clicks Abd:  soft, positive bowel sounds, no organomegally, no rebound, no guarding Ext:  2 plus pulses, no edema, no cyanosis, no clubbing Skin:  No rashes no nodules Neuro:  CN II through XII intact, motor grossly intact  EKG - none today   Assess/Plan: 1. High grade heart block - she is asymptomatic and her conduction, at least on exam appears improved. He medical problems do not make her a good candidate for PPM insertion, particularly in the absence of symptoms. I have recommended watchful waiting. 2. Dementia - I have recommended she followup with neurology although aricept and namenda could make her  bradycardia worse. 3. Rash - she has developed a macular papular rash. I suspect a drug reaction. I have asked them to stop her hydralazine. She will continue amlodipine.  4. HTN - her blood pressure is well controlled today.  Leonia ReevesGregg Taylor,M.D.

## 2017-08-28 NOTE — Patient Instructions (Addendum)
Medication Instructions:  Your physician has recommended you make the following change in your medication:  1.  Stop hydralazine 2.  Start taking lisinopril 5 mg one tablet by mouth daily  Labwork: None ordered.  Testing/Procedures: None ordered.  Follow-Up: Your physician wants you to follow-up in: 4 months with Dr. Ladona Ridgelaylor.     Any Other Special Instructions Will Be Listed Below (If Applicable).  If you need a refill on your cardiac medications before your next appointment, please call your pharmacy.  Lisinopril tablets What is this medicine? LISINOPRIL (lyse IN oh pril) is an ACE inhibitor. This medicine is used to treat high blood pressure and heart failure. It is also used to protect the heart immediately after a heart attack. This medicine may be used for other purposes; ask your health care provider or pharmacist if you have questions. COMMON BRAND NAME(S): Prinivil, Zestril What should I tell my health care provider before I take this medicine? They need to know if you have any of these conditions: -diabetes -heart or blood vessel disease -kidney disease -low blood pressure -previous swelling of the tongue, face, or lips with difficulty breathing, difficulty swallowing, hoarseness, or tightening of the throat -an unusual or allergic reaction to lisinopril, other ACE inhibitors, insect venom, foods, dyes, or preservatives -pregnant or trying to get pregnant -breast-feeding How should I use this medicine? Take this medicine by mouth with a glass of water. Follow the directions on your prescription label. You may take this medicine with or without food. If it upsets your stomach, take it with food. Take your medicine at regular intervals. Do not take it more often than directed. Do not stop taking except on your doctor's advice. Talk to your pediatrician regarding the use of this medicine in children. Special care may be needed. While this drug may be prescribed for children  as young as 606 years of age for selected conditions, precautions do apply. Overdosage: If you think you have taken too much of this medicine contact a poison control center or emergency room at once. NOTE: This medicine is only for you. Do not share this medicine with others. What if I miss a dose? If you miss a dose, take it as soon as you can. If it is almost time for your next dose, take only that dose. Do not take double or extra doses. What may interact with this medicine? Do not take this medicine with any of the following medications: -hymenoptera venom -sacubitril; valsartan This medicines may also interact with the following medications: -aliskiren -angiotensin receptor blockers, like losartan or valsartan -certain medicines for diabetes -diuretics -everolimus -gold compounds -lithium -NSAIDs, medicines for pain and inflammation, like ibuprofen or naproxen -potassium salts or supplements -salt substitutes -sirolimus -temsirolimus This list may not describe all possible interactions. Give your health care provider a list of all the medicines, herbs, non-prescription drugs, or dietary supplements you use. Also tell them if you smoke, drink alcohol, or use illegal drugs. Some items may interact with your medicine. What should I watch for while using this medicine? Visit your doctor or health care professional for regular check ups. Check your blood pressure as directed. Ask your doctor what your blood pressure should be, and when you should contact him or her. Do not treat yourself for coughs, colds, or pain while you are using this medicine without asking your doctor or health care professional for advice. Some ingredients may increase your blood pressure. Women should inform their doctor if they wish to  become pregnant or think they might be pregnant. There is a potential for serious side effects to an unborn child. Talk to your health care professional or pharmacist for more  information. Check with your doctor or health care professional if you get an attack of severe diarrhea, nausea and vomiting, or if you sweat a lot. The loss of too much body fluid can make it dangerous for you to take this medicine. You may get drowsy or dizzy. Do not drive, use machinery, or do anything that needs mental alertness until you know how this drug affects you. Do not stand or sit up quickly, especially if you are an older patient. This reduces the risk of dizzy or fainting spells. Alcohol can make you more drowsy and dizzy. Avoid alcoholic drinks. Avoid salt substitutes unless you are told otherwise by your doctor or health care professional. What side effects may I notice from receiving this medicine? Side effects that you should report to your doctor or health care professional as soon as possible: -allergic reactions like skin rash, itching or hives, swelling of the hands, feet, face, lips, throat, or tongue -breathing problems -signs and symptoms of kidney injury like trouble passing urine or change in the amount of urine -signs and symptoms of increased potassium like muscle weakness; chest pain; or fast, irregular heartbeat -signs and symptoms of liver injury like dark yellow or brown urine; general ill feeling or flu-like symptoms; light-colored stools; loss of appetite; nausea; right upper belly pain; unusually weak or tired; yellowing of the eyes or skin -signs and symptoms of low blood pressure like dizziness; feeling faint or lightheaded, falls; unusually weak or tired -stomach pain with or without nausea and vomiting Side effects that usually do not require medical attention (report to your doctor or health care professional if they continue or are bothersome): -changes in taste -cough -dizziness -fever -headache -sensitivity to light This list may not describe all possible side effects. Call your doctor for medical advice about side effects. You may report side effects  to FDA at 1-800-FDA-1088. Where should I keep my medicine? Keep out of the reach of children. Store at room temperature between 15 and 30 degrees C (59 and 86 degrees F). Protect from moisture. Keep container tightly closed. Throw away any unused medicine after the expiration date. NOTE: This sheet is a summary. It may not cover all possible information. If you have questions about this medicine, talk to your doctor, pharmacist, or health care provider.  2018 Elsevier/Gold Standard (2015-07-09 12:52:35)

## 2017-08-30 NOTE — Progress Notes (Signed)
After Pt visit, family states Pt is no longer taking Remeron.

## 2017-10-08 ENCOUNTER — Encounter: Payer: Medicare Other | Attending: Physical Medicine & Rehabilitation | Admitting: Physical Medicine & Rehabilitation

## 2017-10-08 DIAGNOSIS — I69398 Other sequelae of cerebral infarction: Secondary | ICD-10-CM | POA: Insufficient documentation

## 2017-10-08 DIAGNOSIS — Z950 Presence of cardiac pacemaker: Secondary | ICD-10-CM | POA: Insufficient documentation

## 2017-10-08 DIAGNOSIS — R001 Bradycardia, unspecified: Secondary | ICD-10-CM | POA: Insufficient documentation

## 2017-10-08 DIAGNOSIS — Z9049 Acquired absence of other specified parts of digestive tract: Secondary | ICD-10-CM | POA: Insufficient documentation

## 2017-10-08 DIAGNOSIS — R413 Other amnesia: Secondary | ICD-10-CM | POA: Insufficient documentation

## 2017-10-08 DIAGNOSIS — I10 Essential (primary) hypertension: Secondary | ICD-10-CM | POA: Insufficient documentation

## 2017-10-08 DIAGNOSIS — R110 Nausea: Secondary | ICD-10-CM | POA: Insufficient documentation

## 2017-10-08 DIAGNOSIS — Z803 Family history of malignant neoplasm of breast: Secondary | ICD-10-CM | POA: Insufficient documentation

## 2017-10-08 DIAGNOSIS — G479 Sleep disorder, unspecified: Secondary | ICD-10-CM | POA: Insufficient documentation

## 2017-10-08 DIAGNOSIS — I69320 Aphasia following cerebral infarction: Secondary | ICD-10-CM | POA: Insufficient documentation

## 2017-10-08 DIAGNOSIS — Z90710 Acquired absence of both cervix and uterus: Secondary | ICD-10-CM | POA: Insufficient documentation

## 2017-10-08 DIAGNOSIS — I63532 Cerebral infarction due to unspecified occlusion or stenosis of left posterior cerebral artery: Secondary | ICD-10-CM | POA: Insufficient documentation

## 2017-10-08 DIAGNOSIS — Z800 Family history of malignant neoplasm of digestive organs: Secondary | ICD-10-CM | POA: Insufficient documentation

## 2017-10-31 ENCOUNTER — Other Ambulatory Visit: Payer: Self-pay | Admitting: Nurse Practitioner

## 2017-11-02 NOTE — Telephone Encounter (Signed)
Pharmacy/Patient requesting cheaper alternative to pravastatin 40mg . Preferred and cheaper medication is simvastatin. Ok to switch?

## 2017-11-06 ENCOUNTER — Telehealth: Payer: Self-pay | Admitting: Neurology

## 2017-11-06 MED ORDER — PRAVASTATIN SODIUM 40 MG PO TABS: 40.0000 mg | ORAL_TABLET | Freq: Every day | ORAL | 3 refills | Status: AC

## 2017-11-06 NOTE — Telephone Encounter (Signed)
I called the patient and talk with the daughter, Nicole [name].  For some reason, the pravastatin 40 mg tablet was converted to his simvastatin 20 mg tablet.  Not sure why, there is no documentation whatsoever in the computer.  I will convert the patient back to the original dose of pravastatin 40 mg daily.

## 2017-12-29 ENCOUNTER — Encounter: Payer: Self-pay | Admitting: Internal Medicine

## 2017-12-29 ENCOUNTER — Ambulatory Visit (INDEPENDENT_AMBULATORY_CARE_PROVIDER_SITE_OTHER): Payer: Medicare Other | Admitting: Internal Medicine

## 2017-12-29 VITALS — BP 110/64 | HR 81 | Ht 65.0 in | Wt 128.0 lb

## 2017-12-29 DIAGNOSIS — I6330 Cerebral infarction due to thrombosis of unspecified cerebral artery: Secondary | ICD-10-CM | POA: Diagnosis not present

## 2017-12-29 DIAGNOSIS — I10 Essential (primary) hypertension: Secondary | ICD-10-CM

## 2017-12-29 DIAGNOSIS — I442 Atrioventricular block, complete: Secondary | ICD-10-CM

## 2017-12-29 NOTE — Progress Notes (Signed)
HPI Nicole [name] returns today for ongoing evaluation and management of CHB. The patient sustained a stroke several months ago. She has had worsening memory and dementia symptoms, especially at night. Her son notes that her HR's have improved in recent weeks. She feels well and has no complaints today.  Allergies  Allergen Reactions  . Amlodipine     Other reaction(s): Other (See Comments) Other reaction(s): 10MG -PERIPHERAL EDEMA,SLEEPLESSNESS Other reaction(s): 10MG -PERIPHERAL EDEMA,SLEEPLESSNESS  . Amlodipine Besy-Benazepril Hcl     Other reaction(s): COUGH  . Benazepril     Other reaction(s): Other (See Comments) Other reaction(s): COUGH Other reaction(s): COUGH  . Penicillins     Other reaction(s): Other (See Comments)     Current Outpatient Medications  Medication Sig Dispense Refill  . amLODipine (NORVASC) 10 MG tablet Take 1 tablet (10 mg total) by mouth daily. 30 tablet 1  . aspirin 81 MG chewable tablet Chew 1 tablet (81 mg total) by mouth daily. 30 tablet 0  . lisinopril (PRINIVIL,ZESTRIL) 5 MG tablet Take 1 tablet (5 mg total) by mouth daily. 90 tablet 3  . pravastatin (PRAVACHOL) 40 MG tablet Take 1 tablet (40 mg total) by mouth daily. 90 tablet 3   No current facility-administered medications for this visit.      Past Medical History:  Diagnosis Date  . Hypertension   . Polio     ROS:   All systems reviewed and negative except as noted in the HPI.   Past Surgical History:  Procedure Laterality Date  . ABDOMINAL HYSTERECTOMY    . CHOLECYSTECTOMY    . PACEMAKER IMPLANT N/A 05/21/2017   Procedure: PACEMAKER IMPLANT;  Surgeon: Marinus Mawaylor, Gregg W, MD;  [facility];  Service: Cardiovascular;  Laterality: N/A;     Family History  Problem Relation Age of Onset  . Cancer Mother        stomach  . Cancer Sister        breast     Social History   Socioeconomic History  . Marital status: Widowed    Spouse name: Not on file  .  Number of children: Not on file  . Years of education: Not on file  . Highest education level: Not on file  Occupational History  . Not on file  Social Needs  . Financial resource strain: Not on file  . Food insecurity:    Worry: Not on file    Inability: Not on file  . Transportation needs:    Medical: Not on file    Non-medical: Not on file  Tobacco Use  . Smoking status: Never Smoker  . Smokeless tobacco: Never Used  Substance and Sexual Activity  . Alcohol use: Not on file  . Drug use: Not on file  . Sexual activity: Not on file  Lifestyle  . Physical activity:    Days per week: Not on file    Minutes per session: Not on file  . Stress: Not on file  Relationships  . Social connections:    Talks on phone: Not on file    Gets together: Not on file    Attends religious service: Not on file    Active member of club or organization: Not on file    Attends meetings of clubs or organizations: Not on file    Relationship status: Not on file  . Intimate partner violence:    Fear of current or ex partner: Not on file    Emotionally abused: Not  on file    Physically abused: Not on file    Forced sexual activity: Not on file  Other Topics Concern  . Not on file  Social History Narrative  . Not on file     BP 110/64   Pulse 81   Ht 5\' 5"  (1.651 m)   Wt 128 lb (58.1 kg)   BMI 21.30 kg/m   Physical Exam:  Well appearing elderly woman, NAD HEENT: Unremarkable Neck:  6 cm JVD, no thyromegally Lymphatics:  No adenopathy Back:  No CVA tenderness Lungs:  Clear with no wheezes HEART:  Regular rate rhythm, no murmurs, no rubs, no clicks Abd:  soft, positive bowel sounds, no organomegally, no rebound, no guarding Ext:  2 plus pulses, no edema, no cyanosis, no clubbing Skin:  No rashes no nodules Neuro:  CN II through XII intact, motor grossly intact  EKG - nsr   Assess/Plan: 1. High grade heart block - she has had normalization of her AV conduction. She will undergo  watchful waiting I encouraged the patient to call us if her HR slows. 2. HTN - her blood pressure is controlled. She is not a candidate for AV nodal blocking drugs. Her BP is controlled.  Nicole [name].D.

## 2017-12-29 NOTE — Patient Instructions (Addendum)

## 2018-02-09 NOTE — Progress Notes (Deleted)
[facility]  PATIENT: Nicole [name] DOB: 1928-09-14   REASON FOR VISIT: Follow-up for left superior mesial thalmic infarct, likely secondary to small vessel, on 05/27/17 HISTORY FROM: Patient and son [name]    HISTORY OF PRESENT ILLNESS:FROM RECORDHer sons are at the bedside.  She is lying in bed without distress. She still confused and not orientated, no focal neuro deficit. MRI showed left thalamic infarct. Son denies hx of dementia, and stated that her confusion happened sharply around 8 days ago Interval history 3/11/19CM  [name], 84 year old female returns for follow-up with history of left superior mesial thalmic infarct likely secondary to small vessel disease.  MRA bilateral ACA and PCA stenosis.  Carotid Doppler unremarkable.  2D echo 60-65%.  LDL 142 hemoglobin A1c 5.7 patient was not on anti-thrombotic prior to admission now on aspirin 81 daily.  So far she has not gotten a pacemaker records blood pressure and pulse every day.  Blood pressures range from 103/43-140 /53.  Pulse range consistently in the 40s.  She was supposed to start on pravastatin at discharge however she was never given a prescription.  Son and patient made aware this is for her LDL cholesterol which is high at 142.  B12 TSH ammonia and RPR are all negative.  EEG was abnormal due to background slowing this is very nonspecific and could be due to toxic metabolic diffuse or multifocal structural processes.  Patient complains of fatigue.  She has good and bad days.  She is following up with cardiology.  Her primary care is Dr. Cherly Hensen in [facility].  She returns for reevaluation   REVIEW OF SYSTEMS: Full 14 system review of systems performed and notable only for those listed, all others are neg:  Constitutional: neg  Cardiovascular: neg Ear/Nose/Throat: neg  Skin: neg Eyes: neg Respiratory: neg Gastroitestinal: neg  Hematology/Lymphatic: neg  Endocrine: neg Musculoskeletal: Walking  difficulty, patient has a history of polio Allergy/Immunology: neg Neurological: Memory loss Psychiatric: neg Sleep : neg   ALLERGIES: Allergies  Allergen Reactions  . Amlodipine     Other reaction(s): Other (See Comments) Other reaction(s): 10MG -PERIPHERAL EDEMA,SLEEPLESSNESS Other reaction(s): 10MG -PERIPHERAL EDEMA,SLEEPLESSNESS  . Amlodipine Besy-Benazepril Hcl     Other reaction(s): COUGH  . Benazepril     Other reaction(s): Other (See Comments) Other reaction(s): COUGH Other reaction(s): COUGH  . Penicillins     Other reaction(s): Other (See Comments)    HOME MEDICATIONS: Outpatient Medications Prior to Visit  Medication Sig Dispense Refill  . amLODipine (NORVASC) 10 MG tablet Take 1 tablet (10 mg total) by mouth daily. 30 tablet 1  . aspirin 81 MG chewable tablet Chew 1 tablet (81 mg total) by mouth daily. 30 tablet 0  . lisinopril (PRINIVIL,ZESTRIL) 5 MG tablet Take 1 tablet (5 mg total) by mouth daily. 90 tablet 3  . pravastatin (PRAVACHOL) 40 MG tablet Take 1 tablet (40 mg total) by mouth daily. 90 tablet 3   No facility-administered medications prior to visit.     PAST MEDICAL HISTORY: Past Medical History:  Diagnosis Date  . Hypertension   . Polio     PAST SURGICAL HISTORY: Past Surgical History:  Procedure Laterality Date  . ABDOMINAL HYSTERECTOMY    . CHOLECYSTECTOMY    . PACEMAKER IMPLANT N/A 05/21/2017   Procedure: PACEMAKER IMPLANT;  Surgeon: Marinus Maw, MD;  Location: [facility] INVASIVE CV LAB;  Service: Cardiovascular;  Laterality: N/A;    FAMILY HISTORY: Family History  Problem Relation Age of Onset  . Cancer Mother  stomach  . Cancer Sister        breast    SOCIAL HISTORY: Social History   Socioeconomic History  . Marital status: Widowed    Spouse name: Not on file  . Number of children: Not on file  . Years of education: Not on file  . Highest education level: Not on file  Occupational History  . Not on file  Social Needs   . Financial resource strain: Not on file  . Food insecurity:    Worry: Not on file    Inability: Not on file  . Transportation needs:    Medical: Not on file    Non-medical: Not on file  Tobacco Use  . Smoking status: Never Smoker  . Smokeless tobacco: Never Used  Substance and Sexual Activity  . Alcohol use: Not on file  . Drug use: Not on file  . Sexual activity: Not on file  Lifestyle  . Physical activity:    Days per week: Not on file    Minutes per session: Not on file  . Stress: Not on file  Relationships  . Social connections:    Talks on phone: Not on file    Gets together: Not on file    Attends religious service: Not on file    Active member of club or organization: Not on file    Attends meetings of clubs or organizations: Not on file    Relationship status: Not on file  . Intimate partner violence:    Fear of current or ex partner: Not on file    Emotionally abused: Not on file    Physically abused: Not on file    Forced sexual activity: Not on file  Other Topics Concern  . Not on file  Social History Narrative  . Not on file     PHYSICAL EXAM  There were no vitals filed for this visit. There is no height or weight on file to calculate BMI.  Generalized: Well developed, in no acute distress, well groomed Head: normocephalic and atraumatic,. Oropharynx benign  Neck: Supple, no carotid bruits  Cardiac: Regular rate rhythm, no murmur  Musculoskeletal: No deformity   Neurological examination   Mentation: Alert not oriented to time, place. History taking per son. Attention span and concentration appropriate.  Speech with paraphasic errors able to repeat simple phrases but not able to name.  Follows simple commands.  Cranial nerve II-XII: Pupils were equal round reactive to light extraocular movements were full, visual field were full on confrontational test. Facial sensation and strength were normal. hearing was intact to finger rubbing bilaterally.  Uvula tongue midline. head turning and shoulder shrug were normal and symmetric.Tongue protrusion into cheek strength was normal. Motor: normal bulk and tone, full strength in the BUE, BLE, fine finger movements normal, no pronator drift. No focal weakness Sensory: normal and symmetric to light touch, pinprick, and  Vibration, in the upper and lower extremities Coordination: finger-nose-finger, heel-to-shin bilaterally, no dysmetria, no tremor Reflexes: 1+ upper lower and symmetric, plantar responses were flexor bilaterally. Gait and Station: Deferred due to safety reasons in wheelchair DIAGNOSTIC DATA (LABS, IMAGING, TESTING) - I reviewed patient records, labs, notes, testing and imaging myself where available.  Lab Results  Component Value Date   WBC 8.9 06/05/2017   HGB 10.7 (L) 06/05/2017   HCT 35.0 (L) 06/05/2017   MCV 89.7 06/05/2017   PLT 273 06/05/2017      Component Value Date/Time   NA 139 06/09/2017 0924  K 4.0 06/09/2017 0924   CL 106 06/09/2017 0924   CO2 24 06/09/2017 0924   GLUCOSE 108 (H) 06/09/2017 0924   BUN 22 (H) 06/09/2017 0924   CREATININE 0.92 06/09/2017 0924   CALCIUM 9.0 06/09/2017 0924   PROT 5.4 (L) 05/28/2017 0517   ALBUMIN 3.0 (L) 05/28/2017 0517   AST 22 05/28/2017 0517   ALT 15 05/28/2017 0517   ALKPHOS 57 05/28/2017 0517   BILITOT 0.6 05/28/2017 0517   GFRNONAA 55 (L) 06/09/2017 0924   GFRAA >60 06/09/2017 0924   Lab Results  Component Value Date   CHOL 215 (H) 05/22/2017   HDL 37 (L) 05/22/2017   LDLCALC 142 (H) 05/22/2017   TRIG 179 (H) 05/22/2017   CHOLHDL 5.8 05/22/2017   Lab Results  Component Value Date   HGBA1C 5.7 (H) 05/22/2017   Lab Results  Component Value Date   VITAMINB12 488 05/21/2017   Lab Results  Component Value Date   TSH 1.246 05/21/2017      ASSESSMENT AND PLAN  84 y.o. year old female  has a past medical history of Hypertension and Polio. here for hospital follow-up for stroke. No tPA given due to out  of window.  MRI of the brain left superior mesial thalmic infarct likely secondary to small vessel disease.  MRA bilateral ACA and PCA stenosis.  Carotid Doppler unremarkable.  2D echo 60-65%.  LDL 142 hemoglobin A1c 5.7 patient was not on anti thrombotic  prior to admission now on aspirin 81 daily. The patient is a current patient of Dr. Roda Shutters  who is out of the office today . This note is sent to the work in doctor.     PLAN: Stressed the importance of management of risk factors to prevent further stroke Continue aspirin for secondary stroke prevention Maintain strict control of hypertension with blood pressure goal below 130/90, today's reading 138/54 Pulse 50. continue antihypertensive medications Control of diabetes with hemoglobin A1c below 6.5 followed by primary care most recent hemoglobin A1c 5.7 Cholesterol with LDL cholesterol less than 70, followed by primary care,  most recent 142  Start pravastatin 40 mg daily per discharge summary RX sent to pharmacy Exercise by walking, with assistive device in the home   eat healthy diet with whole grains,  fresh fruits and vegetables Follow-up with primary care for stroke risk factor modification, maintain blood pressure goal less than 130 systolic, diabetes with A1c below 7, lipids with LDL below 70 Continue follow-up with cardiology for bradycardia pacemaker on hold F/U in 6 months Discussed risk for recurrent stroke/ TIA and answered additional questions This was a prolonged visit requiring 45 minutes and medical decision making of high complexity with extensive review of history, hospital chart, counseling and answering questions Nilda Riggs, [facility], [facility], APRN  [facility] [address] [place], [place] [address] [phone]

## 2018-02-10 ENCOUNTER — Ambulatory Visit: Payer: Medicare Other | Admitting: Nurse Practitioner

## 2018-05-25 DIAGNOSIS — I6381 Other cerebral infarction due to occlusion or stenosis of small artery: Secondary | ICD-10-CM | POA: Diagnosis not present

## 2018-05-25 DIAGNOSIS — E785 Hyperlipidemia, unspecified: Secondary | ICD-10-CM | POA: Diagnosis not present

## 2018-05-25 DIAGNOSIS — E46 Unspecified protein-calorie malnutrition: Secondary | ICD-10-CM | POA: Diagnosis not present

## 2018-05-25 DIAGNOSIS — E538 Deficiency of other specified B group vitamins: Secondary | ICD-10-CM | POA: Diagnosis not present

## 2018-05-25 DIAGNOSIS — I442 Atrioventricular block, complete: Secondary | ICD-10-CM | POA: Diagnosis not present

## 2018-05-25 DIAGNOSIS — I10 Essential (primary) hypertension: Secondary | ICD-10-CM | POA: Diagnosis not present

## 2018-12-19 ENCOUNTER — Emergency Department (HOSPITAL_BASED_OUTPATIENT_CLINIC_OR_DEPARTMENT_OTHER): Payer: Medicare Other

## 2018-12-19 ENCOUNTER — Encounter (HOSPITAL_BASED_OUTPATIENT_CLINIC_OR_DEPARTMENT_OTHER): Payer: Self-pay | Admitting: *Deleted

## 2018-12-19 ENCOUNTER — Other Ambulatory Visit: Payer: Self-pay

## 2018-12-19 ENCOUNTER — Emergency Department (HOSPITAL_BASED_OUTPATIENT_CLINIC_OR_DEPARTMENT_OTHER)
Admission: EM | Admit: 2018-12-19 | Discharge: 2018-12-19 | Disposition: A | Discharge status: Home / Self Care | Payer: Medicare Other | Attending: Emergency Medicine | Admitting: Emergency Medicine

## 2018-12-19 DIAGNOSIS — S82832A Other fracture of upper and lower end of left fibula, initial encounter for closed fracture: Secondary | ICD-10-CM | POA: Diagnosis not present

## 2018-12-19 DIAGNOSIS — Y999 Unspecified external cause status: Secondary | ICD-10-CM | POA: Insufficient documentation

## 2018-12-19 DIAGNOSIS — Y9389 Activity, other specified: Secondary | ICD-10-CM | POA: Diagnosis not present

## 2018-12-19 DIAGNOSIS — Y92002 Bathroom of unspecified non-institutional (private) residence single-family (private) house as the place of occurrence of the external cause: Secondary | ICD-10-CM | POA: Insufficient documentation

## 2018-12-19 DIAGNOSIS — F0390 Unspecified dementia without behavioral disturbance: Secondary | ICD-10-CM | POA: Insufficient documentation

## 2018-12-19 DIAGNOSIS — I10 Essential (primary) hypertension: Secondary | ICD-10-CM | POA: Diagnosis not present

## 2018-12-19 DIAGNOSIS — S82842A Displaced bimalleolar fracture of left lower leg, initial encounter for closed fracture: Secondary | ICD-10-CM | POA: Diagnosis not present

## 2018-12-19 DIAGNOSIS — E119 Type 2 diabetes mellitus without complications: Secondary | ICD-10-CM | POA: Diagnosis not present

## 2018-12-19 DIAGNOSIS — M79662 Pain in left lower leg: Secondary | ICD-10-CM | POA: Diagnosis not present

## 2018-12-19 DIAGNOSIS — S99912A Unspecified injury of left ankle, initial encounter: Secondary | ICD-10-CM | POA: Diagnosis present

## 2018-12-19 DIAGNOSIS — W010XXA Fall on same level from slipping, tripping and stumbling without subsequent striking against object, initial encounter: Secondary | ICD-10-CM | POA: Insufficient documentation

## 2018-12-19 DIAGNOSIS — Z79899 Other long term (current) drug therapy: Secondary | ICD-10-CM | POA: Diagnosis not present

## 2018-12-19 DIAGNOSIS — S8252XA Displaced fracture of medial malleolus of left tibia, initial encounter for closed fracture: Secondary | ICD-10-CM | POA: Diagnosis not present

## 2018-12-19 DIAGNOSIS — S8992XA Unspecified injury of left lower leg, initial encounter: Secondary | ICD-10-CM | POA: Diagnosis not present

## 2018-12-19 MED ORDER — ACETAMINOPHEN 500 MG PO TABS
1000.0000 mg | ORAL_TABLET | Freq: Once | ORAL | Status: AC
  Administered 2018-12-19: 1000 mg via ORAL
  Filled 2018-12-19: qty 2

## 2018-12-19 MED ORDER — ACETAMINOPHEN 500 MG PO TABS
ORAL_TABLET | ORAL | Status: AC
  Filled 2018-12-19: qty 1

## 2018-12-19 NOTE — ED Notes (Signed)
ED Provider at bedside. 

## 2018-12-19 NOTE — ED Notes (Addendum)
Assisted pt to car by this RN and EMT

## 2018-12-19 NOTE — ED Provider Notes (Signed)
[facility] EMERGENCY DEPARTMENT Provider Note   CSN: [id number] Arrival date & time: 12/19/18  1954    History   Chief Complaint Chief Complaint  Patient presents with  . Fall    HPI Nicole [name] is a 85 y.o. female.     Patient is a 85 year old female who presents after a fall.  History is limited due to dementia.  History is obtained from the son.  The son states that he was helping her walk into the bathroom and she had some difficulty standing on her left leg which is typical for her.  He tried to help her slide down to the floor and her left leg got twisted under her and bent.  She has been complaining of pain to her left ankle since that time.  She denies any other injuries.  She did not hit her head per the son.  There is no neck or back pain.  Her tetanus shot is up-to-date and was last given in 2016.  He states that she is at her baseline mental status and typically has increased confusion at night with sundowning.     Past Medical History:  Diagnosis Date  . Hypertension   . Polio     Patient Active Problem List   Diagnosis Date Noted  . Mobitz type 2 second degree heart block 06/18/2017  . Hyperlipidemia 06/18/2017  . Slow transit constipation 06/18/2017  . Prediabetes 06/18/2017  . Hypoalbuminemia due to protein-calorie malnutrition ([place]) 06/18/2017  . Non-fluent aphasia 06/18/2017  . Subacute confusional state   . AKI (acute kidney injury) ([place])   . Labile blood pressure   . Bradycardia   . Hypertensive crisis   . Acute blood loss anemia   . Lacunar infarction ([place]) 05/27/2017  . Cerebral thrombosis with cerebral infarction 05/22/2017  . Pacemaker   . Heart block 05/21/2017  . Memory deficit 05/21/2017  . Acute encephalopathy 05/21/2017  . Hypertensive urgency 05/20/2017  . Dizziness 05/30/2015  . Nocturia 05/30/2015  . Recurrent falls 05/30/2015  . Lumbar stenosis 04/30/2015  . Laceration of elbow with complication 60/03/9322  .  Lumbar radiculopathy 02/07/2015  . Spondylolisthesis at L4-L5 level 01/03/2015  . Other intervertebral disc degeneration, lumbar region 11/27/2014  . Primary osteoarthritis of left hip 11/27/2014  . Chronic hip pain 11/16/2014  . Low back pain 11/16/2014  . Essential hypertension 08/10/2013  . Osteoarthrosis 08/10/2013  . Type 2 diabetes mellitus without complication ([place]) 55/73/2202  . Hypercholesterolemia 08/10/2013  . Symptomatic menopausal or female climacteric states 11/30/2012    Past Surgical History:  Procedure Laterality Date  . ABDOMINAL HYSTERECTOMY    . CHOLECYSTECTOMY    . PACEMAKER IMPLANT N/A 05/21/2017   Procedure: PACEMAKER IMPLANT;  Surgeon: Evans Lance, MD;  Location: [place] CV LAB;  Service: Cardiovascular;  Laterality: N/A;     OB History    Gravida  7   Para  6   Term      Preterm      AB      Living        SAB      TAB      Ectopic      Multiple      Live Births               Home Medications    Prior to Admission medications   Medication Sig Start Date End Date Taking? Authorizing Provider  amLODipine (NORVASC) 10 MG tablet Take 1 tablet (10  mg total) by mouth daily. 06/10/17  Yes Angiulli, Mcarthur Rossettianiel J, PA-C  lisinopril (PRINIVIL,ZESTRIL) 5 MG tablet Take 1 tablet (5 mg total) by mouth daily. 08/28/17 12/19/18 Yes Marinus Mawaylor, Gregg W, MD  pravastatin (PRAVACHOL) 40 MG tablet Take 1 tablet (40 mg total) by mouth daily. 11/06/17  Yes York SpanielWillis, Charles K, MD  vitamin B-12 (CYANOCOBALAMIN) 1000 MCG tablet Take 1,000 mcg by mouth daily.   Yes [provider]  aspirin 81 MG chewable tablet Chew 1 tablet (81 mg total) by mouth daily. 05/28/17   Rolly SalterPatel, Pranav M, MD    Family History Family History  Problem Relation Age of Onset  . Cancer Mother        stomach  . Cancer Sister        breast    Social History Social History   Tobacco Use  . Smoking status: Never Smoker  . Smokeless tobacco: Never Used  Substance Use Topics   . Alcohol use: Never    Frequency: Never  . Drug use: Never     Allergies   Amlodipine, Amlodipine besy-benazepril hcl, Benazepril, and Penicillins   Review of Systems Review of Systems  Unable to perform ROS: Dementia     Physical Exam Updated Vital Signs BP (!) 156/45 (BP Location: Left Arm)   Pulse 65   Temp 98.3 F (36.8 C) (Oral)   Resp 18   Wt 72.6 kg   SpO2 96%   BMI 26.63 kg/m   Physical Exam Constitutional:      Appearance: She is well-developed.  HENT:     Head: Normocephalic and atraumatic.  Neck:     Musculoskeletal: Normal range of motion and neck supple.     Comments: No pain to the cervical thoracic or lumbosacral spine Cardiovascular:     Rate and Rhythm: Normal rate.  Pulmonary:     Effort: Pulmonary effort is normal.  Musculoskeletal:        General: Tenderness present.     Comments: Positive swelling and tenderness around the left ankle.  There is a small overlying skin tear which appears to be superficial.  There is some slight swelling and tenderness to the lower leg on the left but no pain to the knee or hip.  Pedal pulses are intact.  There is no other pain on palpation or range of motion of the other extremities.  Skin:    General: Skin is warm and dry.  Neurological:     Mental Status: She is alert.     Comments: Patient is oriented to person and place.  This is her baseline mental status per family.  She is moving all extremities symmetrically without facial drooping or focal deficit.      ED Treatments / Results  Labs (all labs ordered are listed, but only abnormal results are displayed) Labs Reviewed - No data to display  EKG None  Radiology Dg Tibia/fibula Left  Result Date: 12/19/2018 CLINICAL DATA:  Pain status post fall EXAM: LEFT TIBIA AND FIBULA - 2 VIEW COMPARISON:  None. FINDINGS: There is no acute displaced fracture or dislocation. There are mild to moderate multicompartmental degenerative changes of the knee.  IMPRESSION: No acute displaced fracture or dislocation involving the visualized portions of the tibia and fibula. The distal tibia and fibula were not visualized on this exam. Electronically Signed   By: Katherine Mantlehristopher  Green M.D.   On: 12/19/2018 21:04   Dg Ankle Complete Left  Result Date: 12/19/2018 CLINICAL DATA:  Fall with left ankle  bruising. EXAM: LEFT ANKLE COMPLETE - 3+ VIEW COMPARISON:  None. FINDINGS: Exam demonstrates a displaced oblique fracture of the distal fibular metaphysis at the level of the ankle mortise. There is also a displaced transverse fracture of the medial malleolus. Slight widening of the anterior aspect of the tibiotalar joint. Soft tissue swelling over the ankle. Degenerative changes over the mid and hindfoot region. Moderate size inferior calcaneal spur. IMPRESSION: Displaced distal fibular fracture and displaced medial malleolar fractures. Widening of the anterior aspect of the tibiotalar joint. Electronically Signed   By: Elberta Fortisaniel  Boyle M.D.   On: 12/19/2018 21:05    Procedures Procedures (including critical care time)  Medications Ordered in ED Medications - No data to display   Initial Impression / Assessment and Plan / ED Course  I have reviewed the triage vital signs and the nursing notes.  Pertinent labs & imaging results that were available during my care of the patient were reviewed by me and considered in my medical decision making (see chart for details).        Patient is 85 year old who had a mechanical fall and injured her left ankle.  She has evidence of a bimalleolar fracture with some ankle instability.  She was placed in a posterior/stirrup splint.  I spoke with her son and advised that she will need to try to keep her leg elevated to help with the swelling.  There is no suggestions of compartment syndrome currently.  She declines the need for any pain medication and her son states that she has not done well with pain medication in the past.  She  will take Tylenol at home.  I advised to call tomorrow to make appointment to follow-up with orthopedist.  Return precautions were given.  She does have a walker at home to use and I advised pt/son to be nonweightbearing.  Final Clinical Impressions(s) / ED Diagnoses   Final diagnoses:  Bimalleolar fracture of left ankle, closed, initial encounter    ED Discharge Orders    None       Rolan BuccoBelfi, Adit Riddles, MD 12/19/18 2139

## 2018-12-19 NOTE — ED Notes (Signed)
Pt's son who is POA at bedside and d/c instructions provided to him as well as the patient. Voiced understanding.

## 2018-12-19 NOTE — ED Notes (Signed)
Ice pack given

## 2018-12-19 NOTE — ED Triage Notes (Signed)
Slid on her kitchen floor and bumped her leg

## 2018-12-20 ENCOUNTER — Ambulatory Visit (INDEPENDENT_AMBULATORY_CARE_PROVIDER_SITE_OTHER): Payer: Medicare Other | Admitting: Orthopedic Surgery

## 2018-12-20 ENCOUNTER — Encounter: Payer: Self-pay | Admitting: Orthopedic Surgery

## 2018-12-20 VITALS — Ht 65.0 in | Wt 160.0 lb

## 2018-12-20 DIAGNOSIS — S90522A Blister (nonthermal), left ankle, initial encounter: Secondary | ICD-10-CM

## 2018-12-20 DIAGNOSIS — S82842A Displaced bimalleolar fracture of left lower leg, initial encounter for closed fracture: Secondary | ICD-10-CM

## 2018-12-21 ENCOUNTER — Ambulatory Visit (INDEPENDENT_AMBULATORY_CARE_PROVIDER_SITE_OTHER): Payer: Medicare Other | Admitting: Orthopedic Surgery

## 2018-12-21 ENCOUNTER — Other Ambulatory Visit: Payer: Self-pay

## 2018-12-21 ENCOUNTER — Encounter: Payer: Self-pay | Admitting: Orthopedic Surgery

## 2018-12-21 ENCOUNTER — Encounter: Payer: Self-pay | Admitting: Family

## 2018-12-21 ENCOUNTER — Telehealth: Payer: Self-pay | Admitting: Orthopedic Surgery

## 2018-12-21 VITALS — Ht 65.0 in | Wt 160.0 lb

## 2018-12-21 DIAGNOSIS — S82842A Displaced bimalleolar fracture of left lower leg, initial encounter for closed fracture: Secondary | ICD-10-CM

## 2018-12-21 DIAGNOSIS — S90522A Blister (nonthermal), left ankle, initial encounter: Secondary | ICD-10-CM

## 2018-12-21 MED ORDER — HYDROCODONE-ACETAMINOPHEN 5-325 MG PO TABS: 1.0000 | ORAL_TABLET | ORAL | 0 refills | Status: AC | PRN

## 2018-12-21 NOTE — Telephone Encounter (Signed)
Butch Penny the pt daughter in law called in said the pt is in extreme pain. She's been taking tylenol as of right now but they're wondering what can they do or take to relive some of the pain.  [phone]

## 2018-12-21 NOTE — Progress Notes (Signed)
Office Visit Note   Patient: Nicole [name]           Date of Birth: May 23, 1929           MRN: [id number] Visit Date: 12/20/2018              Requested by: Henderson Baltimore, FNP [address] [place],  [place] [address] PCP: Henderson Baltimore, FNP  Chief Complaint  Patient presents with   Left Ankle - Pain    DOI 12/19/18 s/p fall bimal ankle fx      HPI: Patient is an 85 year old woman who was seen for initial evaluation for bimalleolar left ankle fracture that she sustained 1 day ago.  She was initially evaluated at [facility] in [facility].  Patient was placed in a posterior splint.  Assessment & Plan: Visit Diagnoses:  1. Bimalleolar ankle fracture, left, closed, initial encounter   2. Blister of left ankle, initial encounter     Plan: Due to the fracture blister circumferentially around the ankle with weeping edema and the minimally displaced ankle fracture with patient's memory deficit and peripheral vascular disease feel would be best to proceed nonoperatively.  We will place her in a mild compression wrap at this time and a fracture boot.  Once the swelling has decreased we will place her in a compression stocking and a fracture boot with minimizing weightbearing to the left lower extremity.  Discussed the importance of elevation and ice with the family.  Discussed with the family that if we do not have any movement from this initial displacement we could continue with conservative nonoperative therapy.  Follow-Up Instructions: Return in about 1 week (around 12/27/2018).   Ortho Exam  Patient is alert, oriented, no adenopathy, well-dressed, normal affect, normal respiratory effort. Examination patient does not have a palpable dorsalis pedis pulse.  The Doppler was used and she has a dampened monophasic dorsalis pedis pulse she does have a biphasic posterior tibial pulse.  Patient has circumferential blisters from the fracture around the ankle there is  no bony prominence no ischemic skin changes.  Review of the radiographs shows a Weber B bimalleolar fracture with a mortise that is displaced 2 mm.  Imaging: No results found. No images are attached to the encounter.  Labs: Lab Results  Component Value Date   HGBA1C 5.7 (H) 05/22/2017   REPTSTATUS 06/05/2017 FINAL 06/02/2017   CULT >=100,000 COLONIES/mL ENTEROCOCCUS FAECALIS (A) 06/02/2017   LABORGA ENTEROCOCCUS FAECALIS (A) 06/02/2017     Lab Results  Component Value Date   ALBUMIN 3.0 (L) 05/28/2017   ALBUMIN 3.6 05/20/2017   PREALBUMIN 18.5 06/05/2017    Lab Results  Component Value Date   MG 1.7 05/27/2017   MG 1.7 05/26/2017   MG 1.8 05/25/2017   No results found for: VD25OH  Lab Results  Component Value Date   PREALBUMIN 18.5 06/05/2017   CBC EXTENDED Latest Ref Rng & Units 06/05/2017 05/28/2017 05/27/2017  WBC 4.0 - 10.5 K/uL 8.9 8.9 9.0  RBC 3.87 - 5.11 MIL/uL 3.90 3.97 4.02  HGB 12.0 - 15.0 g/dL 10.7(L) 11.2(L) 11.2(L)  HCT 36.0 - 46.0 % 35.0(L) 35.0(L) 35.8(L)  PLT 150 - 400 K/uL 273 241 261  NEUTROABS 1.7 - 7.7 K/uL - 6.9 -  LYMPHSABS 0.7 - 4.0 K/uL - 1.0 -     Body mass index is 26.63 kg/m.  Orders:  No orders of the defined types were placed in this encounter.  No orders  of the defined types were placed in this encounter.    Procedures: No procedures performed  Clinical Data: No additional findings.  ROS:  All other systems negative, except as noted in the HPI. Review of Systems  Objective: Vital Signs: Ht 5\' 5"  (1.651 m)    Wt 160 lb (72.6 kg)    BMI 26.63 kg/m   Specialty Comments:  No specialty comments available.  PMFS History: Patient Active Problem List   Diagnosis Date Noted   Mobitz type 2 second degree heart block 06/18/2017   Hyperlipidemia 06/18/2017   Slow transit constipation 06/18/2017   Prediabetes 06/18/2017   Hypoalbuminemia due to protein-calorie malnutrition ([facility]) 06/18/2017   Non-fluent aphasia  06/18/2017   Subacute confusional state    AKI (acute kidney injury) (HCC)    Labile blood pressure    Bradycardia    Hypertensive crisis    Acute blood loss anemia    Lacunar infarction (HCC) 05/27/2017   Cerebral thrombosis with cerebral infarction 05/22/2017   Pacemaker    Heart block 05/21/2017   Memory deficit 05/21/2017   Acute encephalopathy 05/21/2017   Hypertensive urgency 05/20/2017   Dizziness 05/30/2015   Nocturia 05/30/2015   Recurrent falls 05/30/2015   Lumbar stenosis 04/30/2015   Laceration of elbow with complication 04/24/2015   Lumbar radiculopathy 02/07/2015   Spondylolisthesis at L4-L5 level 01/03/2015   Other intervertebral disc degeneration, lumbar region 11/27/2014   Primary osteoarthritis of left hip 11/27/2014   Chronic hip pain 11/16/2014   Low back pain 11/16/2014   Essential hypertension 08/10/2013   Osteoarthrosis 08/10/2013   Type 2 diabetes mellitus without complication ([facility]CC) 08/10/2013   Hypercholesterolemia 08/10/2013   Symptomatic menopausal or female climacteric states 11/30/2012   Past Medical History:  Diagnosis Date   Hypertension    Polio     Family History  Problem Relation Age of Onset   Cancer Mother        stomach   Cancer Sister        breast    Past Surgical History:  Procedure Laterality Date   ABDOMINAL HYSTERECTOMY     CHOLECYSTECTOMY     PACEMAKER IMPLANT N/A 05/21/2017   Procedure: PACEMAKER IMPLANT;  Surgeon: Marinus Mawaylor, Gregg W, MD;  Location: [facility] INVASIVE CV LAB;  Service: Cardiovascular;  Laterality: N/A;   Social History   Occupational History   Not on file  Tobacco Use   Smoking status: Never Smoker   Smokeless tobacco: Never Used  Substance and Sexual Activity   Alcohol use: Never    Frequency: Never   Drug use: Never   Sexual activity: Not on file

## 2018-12-21 NOTE — Progress Notes (Signed)
Office Visit Note   Patient: Nicole [name]           Date of Birth: 1929-02-15           MRN: [id number] Visit Date: 12/21/2018              Requested by: Henderson Baltimore, FNP [place],  [place] [address] PCP: Henderson Baltimore, FNP  Chief Complaint  Patient presents with  . Left Ankle - Routine Post Op    12/19/18 s/p left ankle fx      HPI: Patient is a 85 year old woman who had fracture blisters venous stasis swelling and blisters with a bimalleolar ankle fracture minimally displaced.  Patient presents stating that she has had increased pain and increased swelling.  Assessment & Plan: Visit Diagnoses:  1. Bimalleolar ankle fracture, left, closed, initial encounter   2. Blister of left ankle, initial encounter     Plan: The leg was wrapped with Kerlix and loosely wrapped Ace wrap.  We will place her in a posterior splint with her ankle in a resting position.  Prescription for Vicodin for pain again discussed with her son the importance of elevation and nonweightbearing repeat radiographs in 1 week.  If we have further displacement we would need to consider surgical intervention.  Follow-Up Instructions: Return in about 1 week (around 12/28/2018).   Ortho Exam  Patient is alert, oriented, no adenopathy, well-dressed, normal affect, normal respiratory effort. Examination patient now has a palpable dorsalis pedis pulse the fracture blisters circumferentially around the ankle are larger.  The venous stasis Wynetta Emery is stable.  There is increased swelling of the forefoot.  We will reapply Curlex and an Ace wrap loosely posterior splint.  Imaging: No results found. No images are attached to the encounter.  Labs: Lab Results  Component Value Date   HGBA1C 5.7 (H) 05/22/2017   REPTSTATUS 06/05/2017 FINAL 06/02/2017   CULT >=100,000 COLONIES/mL ENTEROCOCCUS FAECALIS (A) 06/02/2017   LABORGA ENTEROCOCCUS FAECALIS (A) 06/02/2017     Lab Results   Component Value Date   ALBUMIN 3.0 (L) 05/28/2017   ALBUMIN 3.6 05/20/2017   PREALBUMIN 18.5 06/05/2017    Lab Results  Component Value Date   MG 1.7 05/27/2017   MG 1.7 05/26/2017   MG 1.8 05/25/2017   No results found for: VD25OH  Lab Results  Component Value Date   PREALBUMIN 18.5 06/05/2017   CBC EXTENDED Latest Ref Rng & Units 06/05/2017 05/28/2017 05/27/2017  WBC 4.0 - 10.5 K/uL 8.9 8.9 9.0  RBC 3.87 - 5.11 MIL/uL 3.90 3.97 4.02  HGB 12.0 - 15.0 g/dL 10.7(L) 11.2(L) 11.2(L)  HCT 36.0 - 46.0 % 35.0(L) 35.0(L) 35.8(L)  PLT 150 - 400 K/uL 273 241 261  NEUTROABS 1.7 - 7.7 K/uL - 6.9 -  LYMPHSABS 0.7 - 4.0 K/uL - 1.0 -     Body mass index is 26.63 kg/m.  Orders:  No orders of the defined types were placed in this encounter.  No orders of the defined types were placed in this encounter.    Procedures: No procedures performed  Clinical Data: No additional findings.  ROS:  All other systems negative, except as noted in the HPI. Review of Systems  Objective: Vital Signs: Ht 5\' 5"  (1.651 m)   Wt 160 lb (72.6 kg)   BMI 26.63 kg/m   Specialty Comments:  No specialty comments available.  PMFS History: Patient Active Problem List   Diagnosis Date Noted  .  Mobitz type 2 second degree heart block 06/18/2017  . Hyperlipidemia 06/18/2017  . Slow transit constipation 06/18/2017  . Prediabetes 06/18/2017  . Hypoalbuminemia due to protein-calorie malnutrition (HCC) 06/18/2017  . Non-fluent aphasia 06/18/2017  . Subacute confusional state   . AKI (acute kidney injury) (HCC)   . Labile blood pressure   . Bradycardia   . Hypertensive crisis   . Acute blood loss anemia   . Lacunar infarction (HCC) 05/27/2017  . Cerebral thrombosis with cerebral infarction 05/22/2017  . Pacemaker   . Heart block 05/21/2017  . Memory deficit 05/21/2017  . Acute encephalopathy 05/21/2017  . Hypertensive urgency 05/20/2017  . Dizziness 05/30/2015  . Nocturia 05/30/2015  .  Recurrent falls 05/30/2015  . Lumbar stenosis 04/30/2015  . Laceration of elbow with complication 04/24/2015  . Lumbar radiculopathy 02/07/2015  . Spondylolisthesis at L4-L5 level 01/03/2015  . Other intervertebral disc degeneration, lumbar region 11/27/2014  . Primary osteoarthritis of left hip 11/27/2014  . Chronic hip pain 11/16/2014  . Low back pain 11/16/2014  . Essential hypertension 08/10/2013  . Osteoarthrosis 08/10/2013  . Type 2 diabetes mellitus without complication (HCC) 08/10/2013  . Hypercholesterolemia 08/10/2013  . Symptomatic menopausal or female climacteric states 11/30/2012   Past Medical History:  Diagnosis Date  . Hypertension   . Polio     Family History  Problem Relation Age of Onset  . Cancer Mother        stomach  . Cancer Sister        breast    Past Surgical History:  Procedure Laterality Date  . ABDOMINAL HYSTERECTOMY    . CHOLECYSTECTOMY    . PACEMAKER IMPLANT N/A 05/21/2017   Procedure: PACEMAKER IMPLANT;  Surgeon: Marinus Mawaylor, Gregg W, MD;  [facility];  Service: Cardiovascular;  Laterality: N/A;   Social History   Occupational History  . Not on file  Tobacco Use  . Smoking status: Never Smoker  . Smokeless tobacco: Never Used  Substance and Sexual Activity  . Alcohol use: Never    Frequency: Never  . Drug use: Never  . Sexual activity: Not on file

## 2018-12-21 NOTE — Telephone Encounter (Signed)
Lets have her come in today to change the wrap and evaluate the skin, may have increased fracture blisters.

## 2018-12-21 NOTE — Telephone Encounter (Signed)
I called and sw Butch Penny per Dr. Sharol Given made an appt for the pt to come in today so that we can remove the dressing and try applying a posterior splint vs the fx boot. Pt will be here at 2 pm today.

## 2018-12-21 NOTE — Telephone Encounter (Signed)
You saw this pt 85 y/o F for a bimal ankle fx. She was placed in a dynaflex dressing and fx boot to see if she can heal without surgery/ pt is in pain and family is asking for something to help with this.

## 2018-12-27 ENCOUNTER — Encounter: Payer: Self-pay | Admitting: Orthopedic Surgery

## 2018-12-27 ENCOUNTER — Ambulatory Visit (INDEPENDENT_AMBULATORY_CARE_PROVIDER_SITE_OTHER): Payer: Medicare Other | Admitting: Orthopedic Surgery

## 2018-12-27 ENCOUNTER — Ambulatory Visit (INDEPENDENT_AMBULATORY_CARE_PROVIDER_SITE_OTHER): Payer: Medicare Other

## 2018-12-27 VITALS — Ht 65.0 in | Wt 160.0 lb

## 2018-12-27 DIAGNOSIS — S82842A Displaced bimalleolar fracture of left lower leg, initial encounter for closed fracture: Secondary | ICD-10-CM

## 2018-12-27 NOTE — Progress Notes (Signed)
Office Visit Note   Patient: Nicole [name]           Date of Birth: 06/20/1928           MRN: [id number] Visit Date: 12/27/2018              Requested by: Amelia Joousins, Melissa A, FNP [address] [place],  [place] [id number] PCP: Amelia Joousins, Melissa A, FNP  Chief Complaint  Patient presents with  . Left Ankle - Follow-up    DOI 12/19/18 bilmal ankle fracture.       HPI: Patient is an 85 year old woman with multiple medical problems who was elected to be treated nonoperatively for a bimalleolar left ankle fracture with significant soft tissue blistering and atrophic changes.  Patient was placed in a splint and by report her son has been trying to keep her foot elevated he just purchased a large elevating foam support.  Assessment & Plan: Visit Diagnoses:  1. Bimalleolar ankle fracture, left, closed, initial encounter     Plan: Will need to continue with nonoperative treatment patient soft tissue envelope has gotten worse there is increasing swelling new ulcers.  Have then work on strict elevation 4 x 4's and Ace wrap change daily we will give a 07/11/2014 since heel lifts to place in a fracture boot to keep her from having to get her foot into a 90 degrees.  Patient feels more comfortable with plantarflexion follow-up in 2 weeks with repeat three-view radiographs of the left ankle.  Follow-Up Instructions: Return in about 2 weeks (around 01/10/2019).   Ortho Exam  Patient is alert, oriented, no adenopathy, well-dressed, normal affect, normal respiratory effort. Examination patient's initial small fracture blisters have healed she now has new blisters with ecchymosis bruising and swelling around the ankle and forefoot.  There is no cellulitis there is a small amount of serosanguineous drainage from the blisters.  Imaging: Xr Ankle Complete Left  Result Date: 12/27/2018 3 view radiographs of the left ankle shows 2 mm of displacement of the bimalleolar fracture.  Lateral  joint line shows a congruent joint.  No images are attached to the encounter.  Labs: Lab Results  Component Value Date   HGBA1C 5.7 (H) 05/22/2017   REPTSTATUS 06/05/2017 FINAL 06/02/2017   CULT >=100,000 COLONIES/mL ENTEROCOCCUS FAECALIS (A) 06/02/2017   LABORGA ENTEROCOCCUS FAECALIS (A) 06/02/2017     Lab Results  Component Value Date   ALBUMIN 3.0 (L) 05/28/2017   ALBUMIN 3.6 05/20/2017   PREALBUMIN 18.5 06/05/2017    Lab Results  Component Value Date   MG 1.7 05/27/2017   MG 1.7 05/26/2017   MG 1.8 05/25/2017   No results found for: VD25OH  Lab Results  Component Value Date   PREALBUMIN 18.5 06/05/2017   CBC EXTENDED Latest Ref Rng & Units 06/05/2017 05/28/2017 05/27/2017  WBC 4.0 - 10.5 K/uL 8.9 8.9 9.0  RBC 3.87 - 5.11 MIL/uL 3.90 3.97 4.02  HGB 12.0 - 15.0 g/dL 10.7(L) 11.2(L) 11.2(L)  HCT 36.0 - 46.0 % 35.0(L) 35.0(L) 35.8(L)  PLT 150 - 400 K/uL 273 241 261  NEUTROABS 1.7 - 7.7 K/uL - 6.9 -  LYMPHSABS 0.7 - 4.0 K/uL - 1.0 -     Body mass index is 26.63 kg/m.  Orders:  Orders Placed This Encounter  Procedures  . XR Ankle Complete Left   No orders of the defined types were placed in this encounter.    Procedures: No procedures performed  Clinical Data: No additional findings.  ROS:  All other systems negative, except as noted in the HPI. Review of Systems  Objective: Vital Signs: Ht 5\' 5"  (1.651 m)   Wt 160 lb (72.6 kg)   BMI 26.63 kg/m   Specialty Comments:  No specialty comments available.  PMFS History: Patient Active Problem List   Diagnosis Date Noted  . Mobitz type 2 second degree heart block 06/18/2017  . Hyperlipidemia 06/18/2017  . Slow transit constipation 06/18/2017  . Prediabetes 06/18/2017  . Hypoalbuminemia due to protein-calorie malnutrition ([place]) 06/18/2017  . Non-fluent aphasia 06/18/2017  . Subacute confusional state   . AKI (acute kidney injury) ([place])   . Labile blood pressure   . Bradycardia   .  Hypertensive crisis   . Acute blood loss anemia   . Lacunar infarction ([place]) 05/27/2017  . Cerebral thrombosis with cerebral infarction 05/22/2017  . Pacemaker   . Heart block 05/21/2017  . Memory deficit 05/21/2017  . Acute encephalopathy 05/21/2017  . Hypertensive urgency 05/20/2017  . Dizziness 05/30/2015  . Nocturia 05/30/2015  . Recurrent falls 05/30/2015  . Lumbar stenosis 04/30/2015  . Laceration of elbow with complication 64/40/3474  . Lumbar radiculopathy 02/07/2015  . Spondylolisthesis at L4-L5 level 01/03/2015  . Other intervertebral disc degeneration, lumbar region 11/27/2014  . Primary osteoarthritis of left hip 11/27/2014  . Chronic hip pain 11/16/2014  . Low back pain 11/16/2014  . Essential hypertension 08/10/2013  . Osteoarthrosis 08/10/2013  . Type 2 diabetes mellitus without complication ([place]) 25/95/6387  . Hypercholesterolemia 08/10/2013  . Symptomatic menopausal or female climacteric states 11/30/2012   Past Medical History:  Diagnosis Date  . Hypertension   . Polio     Family History  Problem Relation Age of Onset  . Cancer Mother        stomach  . Cancer Sister        breast    Past Surgical History:  Procedure Laterality Date  . ABDOMINAL HYSTERECTOMY    . CHOLECYSTECTOMY    . PACEMAKER IMPLANT N/A 05/21/2017   Procedure: PACEMAKER IMPLANT;  Surgeon: Evans Lance, MD;  Location: [place] [facility];  Service: Cardiovascular;  Laterality: N/A;   Social History   Occupational History  . Not on file  Tobacco Use  . Smoking status: Never Smoker  . Smokeless tobacco: Never Used  Substance and Sexual Activity  . Alcohol use: Never    Frequency: Never  . Drug use: Never  . Sexual activity: Not on file

## 2018-12-28 DIAGNOSIS — L03116 Cellulitis of left lower limb: Secondary | ICD-10-CM | POA: Diagnosis not present

## 2018-12-28 DIAGNOSIS — R262 Difficulty in walking, not elsewhere classified: Secondary | ICD-10-CM | POA: Diagnosis not present

## 2018-12-28 DIAGNOSIS — E785 Hyperlipidemia, unspecified: Secondary | ICD-10-CM | POA: Diagnosis not present

## 2018-12-28 DIAGNOSIS — Z79899 Other long term (current) drug therapy: Secondary | ICD-10-CM | POA: Diagnosis not present

## 2018-12-28 DIAGNOSIS — I10 Essential (primary) hypertension: Secondary | ICD-10-CM | POA: Diagnosis not present

## 2018-12-28 DIAGNOSIS — I442 Atrioventricular block, complete: Secondary | ICD-10-CM | POA: Diagnosis not present

## 2018-12-28 DIAGNOSIS — S82842D Displaced bimalleolar fracture of left lower leg, subsequent encounter for closed fracture with routine healing: Secondary | ICD-10-CM | POA: Diagnosis not present

## 2018-12-29 DIAGNOSIS — S82842D Displaced bimalleolar fracture of left lower leg, subsequent encounter for closed fracture with routine healing: Secondary | ICD-10-CM | POA: Diagnosis not present

## 2018-12-29 DIAGNOSIS — I10 Essential (primary) hypertension: Secondary | ICD-10-CM | POA: Diagnosis not present

## 2018-12-29 DIAGNOSIS — Z9181 History of falling: Secondary | ICD-10-CM | POA: Diagnosis not present

## 2018-12-29 DIAGNOSIS — Z7982 Long term (current) use of aspirin: Secondary | ICD-10-CM | POA: Diagnosis not present

## 2018-12-29 DIAGNOSIS — E785 Hyperlipidemia, unspecified: Secondary | ICD-10-CM | POA: Diagnosis not present

## 2018-12-29 DIAGNOSIS — F0390 Unspecified dementia without behavioral disturbance: Secondary | ICD-10-CM | POA: Diagnosis not present

## 2018-12-31 DIAGNOSIS — Z7982 Long term (current) use of aspirin: Secondary | ICD-10-CM | POA: Diagnosis not present

## 2018-12-31 DIAGNOSIS — E785 Hyperlipidemia, unspecified: Secondary | ICD-10-CM | POA: Diagnosis not present

## 2018-12-31 DIAGNOSIS — I10 Essential (primary) hypertension: Secondary | ICD-10-CM | POA: Diagnosis not present

## 2018-12-31 DIAGNOSIS — Z9181 History of falling: Secondary | ICD-10-CM | POA: Diagnosis not present

## 2018-12-31 DIAGNOSIS — F0390 Unspecified dementia without behavioral disturbance: Secondary | ICD-10-CM | POA: Diagnosis not present

## 2018-12-31 DIAGNOSIS — S82842D Displaced bimalleolar fracture of left lower leg, subsequent encounter for closed fracture with routine healing: Secondary | ICD-10-CM | POA: Diagnosis not present

## 2019-01-03 DIAGNOSIS — F0390 Unspecified dementia without behavioral disturbance: Secondary | ICD-10-CM | POA: Diagnosis not present

## 2019-01-03 DIAGNOSIS — Z7982 Long term (current) use of aspirin: Secondary | ICD-10-CM | POA: Diagnosis not present

## 2019-01-03 DIAGNOSIS — S82842D Displaced bimalleolar fracture of left lower leg, subsequent encounter for closed fracture with routine healing: Secondary | ICD-10-CM | POA: Diagnosis not present

## 2019-01-03 DIAGNOSIS — E785 Hyperlipidemia, unspecified: Secondary | ICD-10-CM | POA: Diagnosis not present

## 2019-01-03 DIAGNOSIS — Z9181 History of falling: Secondary | ICD-10-CM | POA: Diagnosis not present

## 2019-01-03 DIAGNOSIS — I10 Essential (primary) hypertension: Secondary | ICD-10-CM | POA: Diagnosis not present

## 2019-01-06 DIAGNOSIS — Z7982 Long term (current) use of aspirin: Secondary | ICD-10-CM | POA: Diagnosis not present

## 2019-01-06 DIAGNOSIS — F0390 Unspecified dementia without behavioral disturbance: Secondary | ICD-10-CM | POA: Diagnosis not present

## 2019-01-06 DIAGNOSIS — S82842D Displaced bimalleolar fracture of left lower leg, subsequent encounter for closed fracture with routine healing: Secondary | ICD-10-CM | POA: Diagnosis not present

## 2019-01-06 DIAGNOSIS — E785 Hyperlipidemia, unspecified: Secondary | ICD-10-CM | POA: Diagnosis not present

## 2019-01-06 DIAGNOSIS — Z9181 History of falling: Secondary | ICD-10-CM | POA: Diagnosis not present

## 2019-01-06 DIAGNOSIS — I10 Essential (primary) hypertension: Secondary | ICD-10-CM | POA: Diagnosis not present

## 2019-01-10 DIAGNOSIS — Z9181 History of falling: Secondary | ICD-10-CM | POA: Diagnosis not present

## 2019-01-10 DIAGNOSIS — F0390 Unspecified dementia without behavioral disturbance: Secondary | ICD-10-CM | POA: Diagnosis not present

## 2019-01-10 DIAGNOSIS — E785 Hyperlipidemia, unspecified: Secondary | ICD-10-CM | POA: Diagnosis not present

## 2019-01-10 DIAGNOSIS — S82842D Displaced bimalleolar fracture of left lower leg, subsequent encounter for closed fracture with routine healing: Secondary | ICD-10-CM | POA: Diagnosis not present

## 2019-01-10 DIAGNOSIS — I10 Essential (primary) hypertension: Secondary | ICD-10-CM | POA: Diagnosis not present

## 2019-01-10 DIAGNOSIS — Z7982 Long term (current) use of aspirin: Secondary | ICD-10-CM | POA: Diagnosis not present

## 2019-01-11 DIAGNOSIS — E785 Hyperlipidemia, unspecified: Secondary | ICD-10-CM | POA: Diagnosis not present

## 2019-01-11 DIAGNOSIS — F0390 Unspecified dementia without behavioral disturbance: Secondary | ICD-10-CM | POA: Diagnosis not present

## 2019-01-11 DIAGNOSIS — I10 Essential (primary) hypertension: Secondary | ICD-10-CM | POA: Diagnosis not present

## 2019-01-11 DIAGNOSIS — Z7982 Long term (current) use of aspirin: Secondary | ICD-10-CM | POA: Diagnosis not present

## 2019-01-11 DIAGNOSIS — Z9181 History of falling: Secondary | ICD-10-CM | POA: Diagnosis not present

## 2019-01-11 DIAGNOSIS — S82842D Displaced bimalleolar fracture of left lower leg, subsequent encounter for closed fracture with routine healing: Secondary | ICD-10-CM | POA: Diagnosis not present

## 2019-01-12 DIAGNOSIS — F0390 Unspecified dementia without behavioral disturbance: Secondary | ICD-10-CM | POA: Diagnosis not present

## 2019-01-12 DIAGNOSIS — Z9181 History of falling: Secondary | ICD-10-CM | POA: Diagnosis not present

## 2019-01-12 DIAGNOSIS — I10 Essential (primary) hypertension: Secondary | ICD-10-CM | POA: Diagnosis not present

## 2019-01-12 DIAGNOSIS — E785 Hyperlipidemia, unspecified: Secondary | ICD-10-CM | POA: Diagnosis not present

## 2019-01-12 DIAGNOSIS — Z7982 Long term (current) use of aspirin: Secondary | ICD-10-CM | POA: Diagnosis not present

## 2019-01-12 DIAGNOSIS — S82842D Displaced bimalleolar fracture of left lower leg, subsequent encounter for closed fracture with routine healing: Secondary | ICD-10-CM | POA: Diagnosis not present

## 2019-01-13 ENCOUNTER — Ambulatory Visit (INDEPENDENT_AMBULATORY_CARE_PROVIDER_SITE_OTHER): Payer: Medicare Other

## 2019-01-13 ENCOUNTER — Encounter: Payer: Self-pay | Admitting: Orthopedic Surgery

## 2019-01-13 ENCOUNTER — Ambulatory Visit (INDEPENDENT_AMBULATORY_CARE_PROVIDER_SITE_OTHER): Payer: Medicare Other | Admitting: Orthopedic Surgery

## 2019-01-13 VITALS — Ht 65.0 in | Wt 160.0 lb

## 2019-01-13 DIAGNOSIS — S82842A Displaced bimalleolar fracture of left lower leg, initial encounter for closed fracture: Secondary | ICD-10-CM

## 2019-01-14 DIAGNOSIS — Z7982 Long term (current) use of aspirin: Secondary | ICD-10-CM | POA: Diagnosis not present

## 2019-01-14 DIAGNOSIS — I10 Essential (primary) hypertension: Secondary | ICD-10-CM | POA: Diagnosis not present

## 2019-01-14 DIAGNOSIS — E785 Hyperlipidemia, unspecified: Secondary | ICD-10-CM | POA: Diagnosis not present

## 2019-01-14 DIAGNOSIS — S82842D Displaced bimalleolar fracture of left lower leg, subsequent encounter for closed fracture with routine healing: Secondary | ICD-10-CM | POA: Diagnosis not present

## 2019-01-14 DIAGNOSIS — F0390 Unspecified dementia without behavioral disturbance: Secondary | ICD-10-CM | POA: Diagnosis not present

## 2019-01-14 DIAGNOSIS — Z9181 History of falling: Secondary | ICD-10-CM | POA: Diagnosis not present

## 2019-01-17 DIAGNOSIS — I10 Essential (primary) hypertension: Secondary | ICD-10-CM | POA: Diagnosis not present

## 2019-01-17 DIAGNOSIS — Z7982 Long term (current) use of aspirin: Secondary | ICD-10-CM | POA: Diagnosis not present

## 2019-01-17 DIAGNOSIS — E785 Hyperlipidemia, unspecified: Secondary | ICD-10-CM | POA: Diagnosis not present

## 2019-01-17 DIAGNOSIS — F0390 Unspecified dementia without behavioral disturbance: Secondary | ICD-10-CM | POA: Diagnosis not present

## 2019-01-17 DIAGNOSIS — Z9181 History of falling: Secondary | ICD-10-CM | POA: Diagnosis not present

## 2019-01-17 DIAGNOSIS — S82842D Displaced bimalleolar fracture of left lower leg, subsequent encounter for closed fracture with routine healing: Secondary | ICD-10-CM | POA: Diagnosis not present

## 2019-01-18 DIAGNOSIS — S82842D Displaced bimalleolar fracture of left lower leg, subsequent encounter for closed fracture with routine healing: Secondary | ICD-10-CM | POA: Diagnosis not present

## 2019-01-18 DIAGNOSIS — F0390 Unspecified dementia without behavioral disturbance: Secondary | ICD-10-CM | POA: Diagnosis not present

## 2019-01-18 DIAGNOSIS — Z9181 History of falling: Secondary | ICD-10-CM | POA: Diagnosis not present

## 2019-01-18 DIAGNOSIS — I10 Essential (primary) hypertension: Secondary | ICD-10-CM | POA: Diagnosis not present

## 2019-01-18 DIAGNOSIS — Z7982 Long term (current) use of aspirin: Secondary | ICD-10-CM | POA: Diagnosis not present

## 2019-01-18 DIAGNOSIS — E785 Hyperlipidemia, unspecified: Secondary | ICD-10-CM | POA: Diagnosis not present

## 2019-01-19 ENCOUNTER — Encounter: Payer: Self-pay | Admitting: Orthopedic Surgery

## 2019-01-19 DIAGNOSIS — S82842D Displaced bimalleolar fracture of left lower leg, subsequent encounter for closed fracture with routine healing: Secondary | ICD-10-CM | POA: Diagnosis not present

## 2019-01-19 DIAGNOSIS — E785 Hyperlipidemia, unspecified: Secondary | ICD-10-CM | POA: Diagnosis not present

## 2019-01-19 DIAGNOSIS — Z7982 Long term (current) use of aspirin: Secondary | ICD-10-CM | POA: Diagnosis not present

## 2019-01-19 DIAGNOSIS — I10 Essential (primary) hypertension: Secondary | ICD-10-CM | POA: Diagnosis not present

## 2019-01-19 DIAGNOSIS — F0390 Unspecified dementia without behavioral disturbance: Secondary | ICD-10-CM | POA: Diagnosis not present

## 2019-01-19 DIAGNOSIS — Z9181 History of falling: Secondary | ICD-10-CM | POA: Diagnosis not present

## 2019-01-19 NOTE — Progress Notes (Signed)
Office Visit Note   Patient: Nicole [name]           Date of Birth: 07/07/1928           MRN: [id number] Visit Date: 01/13/2019              Requested by: Amelia Joousins, Melissa A, FNP [address] [place],  [place] [phone] PCP: Amelia Joousins, Melissa A, FNP  Chief Complaint  Patient presents with  . Left Ankle - Routine Post Op    DOI 12/19/18 bimal ankle fracture       HPI: Patient is an 85 year old woman with a minimally displaced bimalleolar left ankle fracture.  She initially had fracture blisters circumferentially around the ankle and has elected to proceed with nonoperative treatment.  The fracture blisters are healing she still has some swelling has been using an Ace wrap for compression.  Patient has been nonweightbearing in the fracture boot with dry dressing changes.  Assessment & Plan: Visit Diagnoses:  1. Bimalleolar ankle fracture, left, closed, initial encounter     Plan: Patient may begin weightbearing as tolerated in the fracture boot for physical therapy.  Repeat three-view radiographs of the left ankle at follow-up.  Follow-Up Instructions: Return in about 3 weeks (around 02/03/2019).   Ortho Exam  Patient is alert, oriented, no adenopathy, well-dressed, normal affect, normal respiratory effort. Examination patient has a 10 x 5 mm ischemic eschar over the medial malleolus there is no cellulitis the circumferential fracture blisters have healed.  Radiographs shows no change in the alignment.  Patient still has a very compromised soft tissue envelope around the ankle.  Imaging: No results found. No images are attached to the encounter.  Labs: Lab Results  Component Value Date   HGBA1C 5.7 (H) 05/22/2017   REPTSTATUS 06/05/2017 FINAL 06/02/2017   CULT >=100,000 COLONIES/mL ENTEROCOCCUS FAECALIS (A) 06/02/2017   LABORGA ENTEROCOCCUS FAECALIS (A) 06/02/2017     Lab Results  Component Value Date   ALBUMIN 3.0 (L) 05/28/2017   ALBUMIN 3.6  05/20/2017   PREALBUMIN 18.5 06/05/2017    Lab Results  Component Value Date   MG 1.7 05/27/2017   MG 1.7 05/26/2017   MG 1.8 05/25/2017   No results found for: VD25OH  Lab Results  Component Value Date   PREALBUMIN 18.5 06/05/2017   CBC EXTENDED Latest Ref Rng & Units 06/05/2017 05/28/2017 05/27/2017  WBC 4.0 - 10.5 K/uL 8.9 8.9 9.0  RBC 3.87 - 5.11 MIL/uL 3.90 3.97 4.02  HGB 12.0 - 15.0 g/dL 10.7(L) 11.2(L) 11.2(L)  HCT 36.0 - 46.0 % 35.0(L) 35.0(L) 35.8(L)  PLT 150 - 400 K/uL 273 241 261  NEUTROABS 1.7 - 7.7 K/uL - 6.9 -  LYMPHSABS 0.7 - 4.0 K/uL - 1.0 -     Body mass index is 26.63 kg/m.  Orders:  Orders Placed This Encounter  Procedures  . XR Ankle Complete Left   No orders of the defined types were placed in this encounter.    Procedures: No procedures performed  Clinical Data: No additional findings.  ROS:  All other systems negative, except as noted in the HPI. Review of Systems  Objective: Vital Signs: Ht 5\' 5"  (1.651 m)   Wt 160 lb (72.6 kg)   BMI 26.63 kg/m   Specialty Comments:  No specialty comments available.  PMFS History: Patient Active Problem List   Diagnosis Date Noted  . Mobitz type 2 second degree heart block 06/18/2017  . Hyperlipidemia 06/18/2017  . Slow transit  constipation 06/18/2017  . Prediabetes 06/18/2017  . Hypoalbuminemia due to protein-calorie malnutrition ([place]) 06/18/2017  . Non-fluent aphasia 06/18/2017  . Subacute confusional state   . AKI (acute kidney injury) ([place])   . Labile blood pressure   . Bradycardia   . Hypertensive crisis   . Acute blood loss anemia   . Lacunar infarction ([place]) 05/27/2017  . Cerebral thrombosis with cerebral infarction 05/22/2017  . Pacemaker   . Heart block 05/21/2017  . Memory deficit 05/21/2017  . Acute encephalopathy 05/21/2017  . Hypertensive urgency 05/20/2017  . Dizziness 05/30/2015  . Nocturia 05/30/2015  . Recurrent falls 05/30/2015  . Lumbar stenosis 04/30/2015  .  Laceration of elbow with complication 56/31/4970  . Lumbar radiculopathy 02/07/2015  . Spondylolisthesis at L4-L5 level 01/03/2015  . Other intervertebral disc degeneration, lumbar region 11/27/2014  . Primary osteoarthritis of left hip 11/27/2014  . Chronic hip pain 11/16/2014  . Low back pain 11/16/2014  . Essential hypertension 08/10/2013  . Osteoarthrosis 08/10/2013  . Type 2 diabetes mellitus without complication ([place]) 26/37/8588  . Hypercholesterolemia 08/10/2013  . Symptomatic menopausal or female climacteric states 11/30/2012   Past Medical History:  Diagnosis Date  . Hypertension   . Polio     Family History  Problem Relation Age of Onset  . Cancer Mother        stomach  . Cancer Sister        breast    Past Surgical History:  Procedure Laterality Date  . ABDOMINAL HYSTERECTOMY    . CHOLECYSTECTOMY    . PACEMAKER IMPLANT N/A 05/21/2017   Procedure: PACEMAKER IMPLANT;  Surgeon: Evans Lance, MD;  Location: [place] [facility];  Service: Cardiovascular;  Laterality: N/A;   Social History   Occupational History  . Not on file  Tobacco Use  . Smoking status: Never Smoker  . Smokeless tobacco: Never Used  Substance and Sexual Activity  . Alcohol use: Never    Frequency: Never  . Drug use: Never  . Sexual activity: Not on file

## 2019-01-20 DIAGNOSIS — S82842D Displaced bimalleolar fracture of left lower leg, subsequent encounter for closed fracture with routine healing: Secondary | ICD-10-CM | POA: Diagnosis not present

## 2019-01-20 DIAGNOSIS — Z9181 History of falling: Secondary | ICD-10-CM | POA: Diagnosis not present

## 2019-01-20 DIAGNOSIS — E785 Hyperlipidemia, unspecified: Secondary | ICD-10-CM | POA: Diagnosis not present

## 2019-01-20 DIAGNOSIS — Z7982 Long term (current) use of aspirin: Secondary | ICD-10-CM | POA: Diagnosis not present

## 2019-01-20 DIAGNOSIS — I10 Essential (primary) hypertension: Secondary | ICD-10-CM | POA: Diagnosis not present

## 2019-01-20 DIAGNOSIS — F0390 Unspecified dementia without behavioral disturbance: Secondary | ICD-10-CM | POA: Diagnosis not present

## 2019-01-24 DIAGNOSIS — I10 Essential (primary) hypertension: Secondary | ICD-10-CM | POA: Diagnosis not present

## 2019-01-24 DIAGNOSIS — Z9181 History of falling: Secondary | ICD-10-CM | POA: Diagnosis not present

## 2019-01-24 DIAGNOSIS — F0390 Unspecified dementia without behavioral disturbance: Secondary | ICD-10-CM | POA: Diagnosis not present

## 2019-01-24 DIAGNOSIS — E785 Hyperlipidemia, unspecified: Secondary | ICD-10-CM | POA: Diagnosis not present

## 2019-01-24 DIAGNOSIS — S82842D Displaced bimalleolar fracture of left lower leg, subsequent encounter for closed fracture with routine healing: Secondary | ICD-10-CM | POA: Diagnosis not present

## 2019-01-24 DIAGNOSIS — Z7982 Long term (current) use of aspirin: Secondary | ICD-10-CM | POA: Diagnosis not present

## 2019-01-25 DIAGNOSIS — S82842D Displaced bimalleolar fracture of left lower leg, subsequent encounter for closed fracture with routine healing: Secondary | ICD-10-CM | POA: Diagnosis not present

## 2019-01-25 DIAGNOSIS — F0390 Unspecified dementia without behavioral disturbance: Secondary | ICD-10-CM | POA: Diagnosis not present

## 2019-01-25 DIAGNOSIS — Z7982 Long term (current) use of aspirin: Secondary | ICD-10-CM | POA: Diagnosis not present

## 2019-01-25 DIAGNOSIS — Z9181 History of falling: Secondary | ICD-10-CM | POA: Diagnosis not present

## 2019-01-25 DIAGNOSIS — I10 Essential (primary) hypertension: Secondary | ICD-10-CM | POA: Diagnosis not present

## 2019-01-25 DIAGNOSIS — E785 Hyperlipidemia, unspecified: Secondary | ICD-10-CM | POA: Diagnosis not present

## 2019-01-26 DIAGNOSIS — E785 Hyperlipidemia, unspecified: Secondary | ICD-10-CM | POA: Diagnosis not present

## 2019-01-26 DIAGNOSIS — F0390 Unspecified dementia without behavioral disturbance: Secondary | ICD-10-CM | POA: Diagnosis not present

## 2019-01-26 DIAGNOSIS — Z9181 History of falling: Secondary | ICD-10-CM | POA: Diagnosis not present

## 2019-01-26 DIAGNOSIS — S82842D Displaced bimalleolar fracture of left lower leg, subsequent encounter for closed fracture with routine healing: Secondary | ICD-10-CM | POA: Diagnosis not present

## 2019-01-26 DIAGNOSIS — I10 Essential (primary) hypertension: Secondary | ICD-10-CM | POA: Diagnosis not present

## 2019-01-26 DIAGNOSIS — Z7982 Long term (current) use of aspirin: Secondary | ICD-10-CM | POA: Diagnosis not present

## 2019-01-27 DIAGNOSIS — E785 Hyperlipidemia, unspecified: Secondary | ICD-10-CM | POA: Diagnosis not present

## 2019-01-27 DIAGNOSIS — I10 Essential (primary) hypertension: Secondary | ICD-10-CM | POA: Diagnosis not present

## 2019-01-27 DIAGNOSIS — S82842D Displaced bimalleolar fracture of left lower leg, subsequent encounter for closed fracture with routine healing: Secondary | ICD-10-CM | POA: Diagnosis not present

## 2019-01-27 DIAGNOSIS — Z7982 Long term (current) use of aspirin: Secondary | ICD-10-CM | POA: Diagnosis not present

## 2019-01-27 DIAGNOSIS — Z9181 History of falling: Secondary | ICD-10-CM | POA: Diagnosis not present

## 2019-01-27 DIAGNOSIS — F0390 Unspecified dementia without behavioral disturbance: Secondary | ICD-10-CM | POA: Diagnosis not present

## 2019-01-28 DIAGNOSIS — F0390 Unspecified dementia without behavioral disturbance: Secondary | ICD-10-CM | POA: Diagnosis not present

## 2019-01-28 DIAGNOSIS — I10 Essential (primary) hypertension: Secondary | ICD-10-CM | POA: Diagnosis not present

## 2019-01-28 DIAGNOSIS — S82842D Displaced bimalleolar fracture of left lower leg, subsequent encounter for closed fracture with routine healing: Secondary | ICD-10-CM | POA: Diagnosis not present

## 2019-01-28 DIAGNOSIS — Z7982 Long term (current) use of aspirin: Secondary | ICD-10-CM | POA: Diagnosis not present

## 2019-01-28 DIAGNOSIS — E785 Hyperlipidemia, unspecified: Secondary | ICD-10-CM | POA: Diagnosis not present

## 2019-01-28 DIAGNOSIS — Z9181 History of falling: Secondary | ICD-10-CM | POA: Diagnosis not present

## 2019-01-31 DIAGNOSIS — F0390 Unspecified dementia without behavioral disturbance: Secondary | ICD-10-CM | POA: Diagnosis not present

## 2019-01-31 DIAGNOSIS — E785 Hyperlipidemia, unspecified: Secondary | ICD-10-CM | POA: Diagnosis not present

## 2019-01-31 DIAGNOSIS — Z9181 History of falling: Secondary | ICD-10-CM | POA: Diagnosis not present

## 2019-01-31 DIAGNOSIS — S82842D Displaced bimalleolar fracture of left lower leg, subsequent encounter for closed fracture with routine healing: Secondary | ICD-10-CM | POA: Diagnosis not present

## 2019-01-31 DIAGNOSIS — Z7982 Long term (current) use of aspirin: Secondary | ICD-10-CM | POA: Diagnosis not present

## 2019-01-31 DIAGNOSIS — I10 Essential (primary) hypertension: Secondary | ICD-10-CM | POA: Diagnosis not present

## 2019-02-01 DIAGNOSIS — S82842D Displaced bimalleolar fracture of left lower leg, subsequent encounter for closed fracture with routine healing: Secondary | ICD-10-CM | POA: Diagnosis not present

## 2019-02-01 DIAGNOSIS — E785 Hyperlipidemia, unspecified: Secondary | ICD-10-CM | POA: Diagnosis not present

## 2019-02-01 DIAGNOSIS — Z9181 History of falling: Secondary | ICD-10-CM | POA: Diagnosis not present

## 2019-02-01 DIAGNOSIS — Z7982 Long term (current) use of aspirin: Secondary | ICD-10-CM | POA: Diagnosis not present

## 2019-02-01 DIAGNOSIS — F0390 Unspecified dementia without behavioral disturbance: Secondary | ICD-10-CM | POA: Diagnosis not present

## 2019-02-01 DIAGNOSIS — I10 Essential (primary) hypertension: Secondary | ICD-10-CM | POA: Diagnosis not present

## 2019-02-02 DIAGNOSIS — I10 Essential (primary) hypertension: Secondary | ICD-10-CM | POA: Diagnosis not present

## 2019-02-02 DIAGNOSIS — S82842D Displaced bimalleolar fracture of left lower leg, subsequent encounter for closed fracture with routine healing: Secondary | ICD-10-CM | POA: Diagnosis not present

## 2019-02-02 DIAGNOSIS — E785 Hyperlipidemia, unspecified: Secondary | ICD-10-CM | POA: Diagnosis not present

## 2019-02-02 DIAGNOSIS — Z7982 Long term (current) use of aspirin: Secondary | ICD-10-CM | POA: Diagnosis not present

## 2019-02-02 DIAGNOSIS — Z9181 History of falling: Secondary | ICD-10-CM | POA: Diagnosis not present

## 2019-02-02 DIAGNOSIS — F0390 Unspecified dementia without behavioral disturbance: Secondary | ICD-10-CM | POA: Diagnosis not present

## 2019-02-03 ENCOUNTER — Ambulatory Visit (INDEPENDENT_AMBULATORY_CARE_PROVIDER_SITE_OTHER): Payer: Medicare Other | Admitting: Orthopedic Surgery

## 2019-02-03 ENCOUNTER — Encounter: Payer: Self-pay | Admitting: Orthopedic Surgery

## 2019-02-03 ENCOUNTER — Ambulatory Visit (INDEPENDENT_AMBULATORY_CARE_PROVIDER_SITE_OTHER): Payer: Medicare Other

## 2019-02-03 VITALS — Ht 65.0 in | Wt 160.0 lb

## 2019-02-03 DIAGNOSIS — S82842A Displaced bimalleolar fracture of left lower leg, initial encounter for closed fracture: Secondary | ICD-10-CM

## 2019-02-04 ENCOUNTER — Encounter: Payer: Self-pay | Admitting: Orthopedic Surgery

## 2019-02-04 DIAGNOSIS — E785 Hyperlipidemia, unspecified: Secondary | ICD-10-CM | POA: Diagnosis not present

## 2019-02-04 DIAGNOSIS — I10 Essential (primary) hypertension: Secondary | ICD-10-CM | POA: Diagnosis not present

## 2019-02-04 DIAGNOSIS — S82842D Displaced bimalleolar fracture of left lower leg, subsequent encounter for closed fracture with routine healing: Secondary | ICD-10-CM | POA: Diagnosis not present

## 2019-02-04 DIAGNOSIS — Z9181 History of falling: Secondary | ICD-10-CM | POA: Diagnosis not present

## 2019-02-04 DIAGNOSIS — F0390 Unspecified dementia without behavioral disturbance: Secondary | ICD-10-CM | POA: Diagnosis not present

## 2019-02-04 DIAGNOSIS — Z7982 Long term (current) use of aspirin: Secondary | ICD-10-CM | POA: Diagnosis not present

## 2019-02-04 NOTE — Progress Notes (Signed)
Office Visit Note   Patient: Nicole [name]           Date of Birth: 1929-02-01           MRN: [id number] Visit Date: 02/03/2019              Requested by: Henderson Baltimore, FNP [address] [place],  [place] [address] PCP: Henderson [place], FNP  Chief Complaint  Patient presents with  . Left Ankle - Follow-up    DOI 12/19/18 left ankle bimal ankle fx.       HPI: Patient is an 85 year old woman who presents status post closed bimalleolar fracture.  Patient had fracture blisters and ischemic ulcers around the ankle and nonoperative treatment was elected due to the poor soft tissue envelope.  Patient has been nonweightbearing with a fracture boot.  Her son states she has swelling and redness when the foot is dependent.  Assessment & Plan: Visit Diagnoses:  1. Bimalleolar ankle fracture, left, closed, initial encounter     Plan: Patient may be weightbearing as tolerated in the fracture boot she may advance to a regular shoe if this is more comfortable she is having difficulty with the weight of the fracture boot.  Follow-Up Instructions: Return if symptoms worsen or fail to improve.   Ortho Exam  Patient is alert, oriented, no adenopathy, well-dressed, normal affect, normal respiratory effort. Examination patient has healed blisters circumferentially around the ankle.  She does have dependent redness which resolves with elevation.  There is a small 5 mm scab over the medial malleolus.  The lateral malleolus is nontender to palpation the medial malleolus is tender to palpation she has dorsiflexion to neutral.  Imaging: Xr Ankle Complete Left  Result Date: 02/04/2019 3 view radiographs of the left ankle shows healing callus of the Weber B fibular fracture.  There is approximately 2 mm of lateral displacement of the talus within the mortise.  Appears to be a delayed union of the medial malleolus.  No images are attached to the encounter.  Labs: Lab Results   Component Value Date   HGBA1C 5.7 (H) 05/22/2017   REPTSTATUS 06/05/2017 FINAL 06/02/2017   CULT >=100,000 COLONIES/mL ENTEROCOCCUS FAECALIS (A) 06/02/2017   LABORGA ENTEROCOCCUS FAECALIS (A) 06/02/2017     Lab Results  Component Value Date   ALBUMIN 3.0 (L) 05/28/2017   ALBUMIN 3.6 05/20/2017   PREALBUMIN 18.5 06/05/2017    Lab Results  Component Value Date   MG 1.7 05/27/2017   MG 1.7 05/26/2017   MG 1.8 05/25/2017   No results found for: VD25OH  Lab Results  Component Value Date   PREALBUMIN 18.5 06/05/2017   CBC EXTENDED Latest Ref Rng & Units 06/05/2017 05/28/2017 05/27/2017  WBC 4.0 - 10.5 K/uL 8.9 8.9 9.0  RBC 3.87 - 5.11 MIL/uL 3.90 3.97 4.02  HGB 12.0 - 15.0 g/dL 10.7(L) 11.2(L) 11.2(L)  HCT 36.0 - 46.0 % 35.0(L) 35.0(L) 35.8(L)  PLT 150 - 400 K/uL 273 241 261  NEUTROABS 1.7 - 7.7 K/uL - 6.9 -  LYMPHSABS 0.7 - 4.0 K/uL - 1.0 -     Body mass index is 26.63 kg/m.  Orders:  Orders Placed This Encounter  Procedures  . XR Ankle Complete Left   No orders of the defined types were placed in this encounter.    Procedures: No procedures performed  Clinical Data: No additional findings.  ROS:  All other systems negative, except as noted in the HPI. Review of Systems  Objective: Vital Signs: Ht 5\' 5"  (1.651 m)   Wt 160 lb (72.6 kg)   BMI 26.63 kg/m   Specialty Comments:  No specialty comments available.  PMFS History: Patient Active Problem List   Diagnosis Date Noted  . Mobitz type 2 second degree heart block 06/18/2017  . Hyperlipidemia 06/18/2017  . Slow transit constipation 06/18/2017  . Prediabetes 06/18/2017  . Hypoalbuminemia due to protein-calorie malnutrition (HCC) 06/18/2017  . Non-fluent aphasia 06/18/2017  . Subacute confusional state   . AKI (acute kidney injury) (HCC)   . Labile blood pressure   . Bradycardia   . Hypertensive crisis   . Acute blood loss anemia   . Lacunar infarction (HCC) 05/27/2017  . Cerebral thrombosis  with cerebral infarction 05/22/2017  . Pacemaker   . Heart block 05/21/2017  . Memory deficit 05/21/2017  . Acute encephalopathy 05/21/2017  . Hypertensive urgency 05/20/2017  . Dizziness 05/30/2015  . Nocturia 05/30/2015  . Recurrent falls 05/30/2015  . Lumbar stenosis 04/30/2015  . Laceration of elbow with complication 04/24/2015  . Lumbar radiculopathy 02/07/2015  . Spondylolisthesis at L4-L5 level 01/03/2015  . Other intervertebral disc degeneration, lumbar region 11/27/2014  . Primary osteoarthritis of left hip 11/27/2014  . Chronic hip pain 11/16/2014  . Low back pain 11/16/2014  . Essential hypertension 08/10/2013  . Osteoarthrosis 08/10/2013  . Type 2 diabetes mellitus without complication (HCC) 08/10/2013  . Hypercholesterolemia 08/10/2013  . Symptomatic menopausal or female climacteric states 11/30/2012   Past Medical History:  Diagnosis Date  . Hypertension   . Polio     Family History  Problem Relation Age of Onset  . Cancer Mother        stomach  . Cancer Sister        breast    Past Surgical History:  Procedure Laterality Date  . ABDOMINAL HYSTERECTOMY    . CHOLECYSTECTOMY    . PACEMAKER IMPLANT N/A 05/21/2017   Procedure: PACEMAKER IMPLANT;  Surgeon: Marinus Mawaylor, Gregg W, MD;  [facility];  Service: Cardiovascular;  Laterality: N/A;   Social History   Occupational History  . Not on file  Tobacco Use  . Smoking status: Never Smoker  . Smokeless tobacco: Never Used  Substance and Sexual Activity  . Alcohol use: Never    Frequency: Never  . Drug use: Never  . Sexual activity: Not on file

## 2019-02-07 DIAGNOSIS — Z9181 History of falling: Secondary | ICD-10-CM | POA: Diagnosis not present

## 2019-02-07 DIAGNOSIS — Z7982 Long term (current) use of aspirin: Secondary | ICD-10-CM | POA: Diagnosis not present

## 2019-02-07 DIAGNOSIS — F0390 Unspecified dementia without behavioral disturbance: Secondary | ICD-10-CM | POA: Diagnosis not present

## 2019-02-07 DIAGNOSIS — E785 Hyperlipidemia, unspecified: Secondary | ICD-10-CM | POA: Diagnosis not present

## 2019-02-07 DIAGNOSIS — S82842D Displaced bimalleolar fracture of left lower leg, subsequent encounter for closed fracture with routine healing: Secondary | ICD-10-CM | POA: Diagnosis not present

## 2019-02-07 DIAGNOSIS — I10 Essential (primary) hypertension: Secondary | ICD-10-CM | POA: Diagnosis not present

## 2019-02-08 DIAGNOSIS — Z9181 History of falling: Secondary | ICD-10-CM | POA: Diagnosis not present

## 2019-02-08 DIAGNOSIS — E785 Hyperlipidemia, unspecified: Secondary | ICD-10-CM | POA: Diagnosis not present

## 2019-02-08 DIAGNOSIS — Z7982 Long term (current) use of aspirin: Secondary | ICD-10-CM | POA: Diagnosis not present

## 2019-02-08 DIAGNOSIS — I10 Essential (primary) hypertension: Secondary | ICD-10-CM | POA: Diagnosis not present

## 2019-02-08 DIAGNOSIS — S82842D Displaced bimalleolar fracture of left lower leg, subsequent encounter for closed fracture with routine healing: Secondary | ICD-10-CM | POA: Diagnosis not present

## 2019-02-08 DIAGNOSIS — F0390 Unspecified dementia without behavioral disturbance: Secondary | ICD-10-CM | POA: Diagnosis not present

## 2019-02-09 DIAGNOSIS — S82842D Displaced bimalleolar fracture of left lower leg, subsequent encounter for closed fracture with routine healing: Secondary | ICD-10-CM | POA: Diagnosis not present

## 2019-02-09 DIAGNOSIS — Z7982 Long term (current) use of aspirin: Secondary | ICD-10-CM | POA: Diagnosis not present

## 2019-02-09 DIAGNOSIS — Z9181 History of falling: Secondary | ICD-10-CM | POA: Diagnosis not present

## 2019-02-09 DIAGNOSIS — E785 Hyperlipidemia, unspecified: Secondary | ICD-10-CM | POA: Diagnosis not present

## 2019-02-09 DIAGNOSIS — F0390 Unspecified dementia without behavioral disturbance: Secondary | ICD-10-CM | POA: Diagnosis not present

## 2019-02-09 DIAGNOSIS — I10 Essential (primary) hypertension: Secondary | ICD-10-CM | POA: Diagnosis not present

## 2019-02-11 DIAGNOSIS — Z7982 Long term (current) use of aspirin: Secondary | ICD-10-CM | POA: Diagnosis not present

## 2019-02-11 DIAGNOSIS — I10 Essential (primary) hypertension: Secondary | ICD-10-CM | POA: Diagnosis not present

## 2019-02-11 DIAGNOSIS — Z9181 History of falling: Secondary | ICD-10-CM | POA: Diagnosis not present

## 2019-02-11 DIAGNOSIS — F0390 Unspecified dementia without behavioral disturbance: Secondary | ICD-10-CM | POA: Diagnosis not present

## 2019-02-11 DIAGNOSIS — E785 Hyperlipidemia, unspecified: Secondary | ICD-10-CM | POA: Diagnosis not present

## 2019-02-11 DIAGNOSIS — S82842D Displaced bimalleolar fracture of left lower leg, subsequent encounter for closed fracture with routine healing: Secondary | ICD-10-CM | POA: Diagnosis not present

## 2019-02-14 DIAGNOSIS — F0390 Unspecified dementia without behavioral disturbance: Secondary | ICD-10-CM | POA: Diagnosis not present

## 2019-02-14 DIAGNOSIS — S82842D Displaced bimalleolar fracture of left lower leg, subsequent encounter for closed fracture with routine healing: Secondary | ICD-10-CM | POA: Diagnosis not present

## 2019-02-14 DIAGNOSIS — Z7982 Long term (current) use of aspirin: Secondary | ICD-10-CM | POA: Diagnosis not present

## 2019-02-14 DIAGNOSIS — I10 Essential (primary) hypertension: Secondary | ICD-10-CM | POA: Diagnosis not present

## 2019-02-14 DIAGNOSIS — Z9181 History of falling: Secondary | ICD-10-CM | POA: Diagnosis not present

## 2019-02-14 DIAGNOSIS — E785 Hyperlipidemia, unspecified: Secondary | ICD-10-CM | POA: Diagnosis not present

## 2019-02-16 DIAGNOSIS — F0390 Unspecified dementia without behavioral disturbance: Secondary | ICD-10-CM | POA: Diagnosis not present

## 2019-02-16 DIAGNOSIS — I10 Essential (primary) hypertension: Secondary | ICD-10-CM | POA: Diagnosis not present

## 2019-02-16 DIAGNOSIS — E785 Hyperlipidemia, unspecified: Secondary | ICD-10-CM | POA: Diagnosis not present

## 2019-02-16 DIAGNOSIS — Z7982 Long term (current) use of aspirin: Secondary | ICD-10-CM | POA: Diagnosis not present

## 2019-02-16 DIAGNOSIS — Z9181 History of falling: Secondary | ICD-10-CM | POA: Diagnosis not present

## 2019-02-16 DIAGNOSIS — S82842D Displaced bimalleolar fracture of left lower leg, subsequent encounter for closed fracture with routine healing: Secondary | ICD-10-CM | POA: Diagnosis not present

## 2019-02-18 ENCOUNTER — Other Ambulatory Visit (HOSPITAL_COMMUNITY)
Admission: RE | Admit: 2019-02-18 | Discharge: 2019-02-18 | Disposition: A | Discharge status: Home / Self Care | Payer: Medicare Other | Source: Ambulatory Visit | Attending: Internal Medicine | Admitting: Internal Medicine

## 2019-02-18 DIAGNOSIS — F0390 Unspecified dementia without behavioral disturbance: Secondary | ICD-10-CM | POA: Diagnosis not present

## 2019-02-18 DIAGNOSIS — N390 Urinary tract infection, site not specified: Secondary | ICD-10-CM | POA: Insufficient documentation

## 2019-02-18 DIAGNOSIS — S82842D Displaced bimalleolar fracture of left lower leg, subsequent encounter for closed fracture with routine healing: Secondary | ICD-10-CM | POA: Diagnosis not present

## 2019-02-18 DIAGNOSIS — Z9181 History of falling: Secondary | ICD-10-CM | POA: Diagnosis not present

## 2019-02-18 DIAGNOSIS — I10 Essential (primary) hypertension: Secondary | ICD-10-CM | POA: Diagnosis not present

## 2019-02-18 DIAGNOSIS — Z7982 Long term (current) use of aspirin: Secondary | ICD-10-CM | POA: Diagnosis not present

## 2019-02-18 DIAGNOSIS — E785 Hyperlipidemia, unspecified: Secondary | ICD-10-CM | POA: Diagnosis not present

## 2019-02-18 LAB — URINALYSIS, ROUTINE W REFLEX MICROSCOPIC
Bilirubin Urine: NEGATIVE
Glucose, UA: NEGATIVE mg/dL
Hgb urine dipstick: NEGATIVE
Ketones, ur: NEGATIVE mg/dL
Leukocytes,Ua: NEGATIVE
Nitrite: NEGATIVE
Protein, ur: NEGATIVE mg/dL
Specific Gravity, Urine: 1.016 (ref 1.005–1.030)
pH: 6 (ref 5.0–8.0)

## 2019-02-19 LAB — URINE CULTURE: Culture: NO GROWTH

## 2019-02-21 DIAGNOSIS — I10 Essential (primary) hypertension: Secondary | ICD-10-CM | POA: Diagnosis not present

## 2019-02-21 DIAGNOSIS — E785 Hyperlipidemia, unspecified: Secondary | ICD-10-CM | POA: Diagnosis not present

## 2019-02-21 DIAGNOSIS — Z9181 History of falling: Secondary | ICD-10-CM | POA: Diagnosis not present

## 2019-02-21 DIAGNOSIS — S82842D Displaced bimalleolar fracture of left lower leg, subsequent encounter for closed fracture with routine healing: Secondary | ICD-10-CM | POA: Diagnosis not present

## 2019-02-21 DIAGNOSIS — F0390 Unspecified dementia without behavioral disturbance: Secondary | ICD-10-CM | POA: Diagnosis not present

## 2019-02-21 DIAGNOSIS — Z7982 Long term (current) use of aspirin: Secondary | ICD-10-CM | POA: Diagnosis not present

## 2019-02-22 DIAGNOSIS — E785 Hyperlipidemia, unspecified: Secondary | ICD-10-CM | POA: Diagnosis not present

## 2019-02-22 DIAGNOSIS — Z7982 Long term (current) use of aspirin: Secondary | ICD-10-CM | POA: Diagnosis not present

## 2019-02-22 DIAGNOSIS — F0390 Unspecified dementia without behavioral disturbance: Secondary | ICD-10-CM | POA: Diagnosis not present

## 2019-02-22 DIAGNOSIS — Z9181 History of falling: Secondary | ICD-10-CM | POA: Diagnosis not present

## 2019-02-22 DIAGNOSIS — I10 Essential (primary) hypertension: Secondary | ICD-10-CM | POA: Diagnosis not present

## 2019-02-22 DIAGNOSIS — S82842D Displaced bimalleolar fracture of left lower leg, subsequent encounter for closed fracture with routine healing: Secondary | ICD-10-CM | POA: Diagnosis not present

## 2019-02-23 DIAGNOSIS — I10 Essential (primary) hypertension: Secondary | ICD-10-CM | POA: Diagnosis not present

## 2019-02-23 DIAGNOSIS — Z9181 History of falling: Secondary | ICD-10-CM | POA: Diagnosis not present

## 2019-02-23 DIAGNOSIS — F0390 Unspecified dementia without behavioral disturbance: Secondary | ICD-10-CM | POA: Diagnosis not present

## 2019-02-23 DIAGNOSIS — Z7982 Long term (current) use of aspirin: Secondary | ICD-10-CM | POA: Diagnosis not present

## 2019-02-23 DIAGNOSIS — S82842D Displaced bimalleolar fracture of left lower leg, subsequent encounter for closed fracture with routine healing: Secondary | ICD-10-CM | POA: Diagnosis not present

## 2019-02-23 DIAGNOSIS — E785 Hyperlipidemia, unspecified: Secondary | ICD-10-CM | POA: Diagnosis not present

## 2019-02-25 DIAGNOSIS — Z7982 Long term (current) use of aspirin: Secondary | ICD-10-CM | POA: Diagnosis not present

## 2019-02-25 DIAGNOSIS — F0390 Unspecified dementia without behavioral disturbance: Secondary | ICD-10-CM | POA: Diagnosis not present

## 2019-02-25 DIAGNOSIS — S82842D Displaced bimalleolar fracture of left lower leg, subsequent encounter for closed fracture with routine healing: Secondary | ICD-10-CM | POA: Diagnosis not present

## 2019-02-25 DIAGNOSIS — I10 Essential (primary) hypertension: Secondary | ICD-10-CM | POA: Diagnosis not present

## 2019-02-25 DIAGNOSIS — Z9181 History of falling: Secondary | ICD-10-CM | POA: Diagnosis not present

## 2019-02-25 DIAGNOSIS — E785 Hyperlipidemia, unspecified: Secondary | ICD-10-CM | POA: Diagnosis not present

## 2019-02-27 DIAGNOSIS — I10 Essential (primary) hypertension: Secondary | ICD-10-CM | POA: Diagnosis not present

## 2019-02-27 DIAGNOSIS — F0390 Unspecified dementia without behavioral disturbance: Secondary | ICD-10-CM | POA: Diagnosis not present

## 2019-02-27 DIAGNOSIS — Z9181 History of falling: Secondary | ICD-10-CM | POA: Diagnosis not present

## 2019-02-27 DIAGNOSIS — E785 Hyperlipidemia, unspecified: Secondary | ICD-10-CM | POA: Diagnosis not present

## 2019-02-27 DIAGNOSIS — Z4800 Encounter for change or removal of nonsurgical wound dressing: Secondary | ICD-10-CM | POA: Diagnosis not present

## 2019-02-27 DIAGNOSIS — Z7982 Long term (current) use of aspirin: Secondary | ICD-10-CM | POA: Diagnosis not present

## 2019-02-27 DIAGNOSIS — L89892 Pressure ulcer of other site, stage 2: Secondary | ICD-10-CM | POA: Diagnosis not present

## 2019-02-27 DIAGNOSIS — S82842D Displaced bimalleolar fracture of left lower leg, subsequent encounter for closed fracture with routine healing: Secondary | ICD-10-CM | POA: Diagnosis not present

## 2019-03-02 DIAGNOSIS — E785 Hyperlipidemia, unspecified: Secondary | ICD-10-CM | POA: Diagnosis not present

## 2019-03-02 DIAGNOSIS — I10 Essential (primary) hypertension: Secondary | ICD-10-CM | POA: Diagnosis not present

## 2019-03-02 DIAGNOSIS — Z9181 History of falling: Secondary | ICD-10-CM | POA: Diagnosis not present

## 2019-03-02 DIAGNOSIS — L89892 Pressure ulcer of other site, stage 2: Secondary | ICD-10-CM | POA: Diagnosis not present

## 2019-03-02 DIAGNOSIS — S82842D Displaced bimalleolar fracture of left lower leg, subsequent encounter for closed fracture with routine healing: Secondary | ICD-10-CM | POA: Diagnosis not present

## 2019-03-02 DIAGNOSIS — F0390 Unspecified dementia without behavioral disturbance: Secondary | ICD-10-CM | POA: Diagnosis not present

## 2019-03-29 DIAGNOSIS — S82842D Displaced bimalleolar fracture of left lower leg, subsequent encounter for closed fracture with routine healing: Secondary | ICD-10-CM | POA: Diagnosis not present

## 2019-03-29 DIAGNOSIS — L89892 Pressure ulcer of other site, stage 2: Secondary | ICD-10-CM | POA: Diagnosis not present

## 2019-03-29 DIAGNOSIS — I10 Essential (primary) hypertension: Secondary | ICD-10-CM | POA: Diagnosis not present

## 2019-03-29 DIAGNOSIS — Z4800 Encounter for change or removal of nonsurgical wound dressing: Secondary | ICD-10-CM | POA: Diagnosis not present

## 2019-03-29 DIAGNOSIS — E785 Hyperlipidemia, unspecified: Secondary | ICD-10-CM | POA: Diagnosis not present

## 2019-03-29 DIAGNOSIS — Z9181 History of falling: Secondary | ICD-10-CM | POA: Diagnosis not present

## 2019-03-29 DIAGNOSIS — Z7982 Long term (current) use of aspirin: Secondary | ICD-10-CM | POA: Diagnosis not present

## 2019-03-29 DIAGNOSIS — F0390 Unspecified dementia without behavioral disturbance: Secondary | ICD-10-CM | POA: Diagnosis not present

## 2019-09-14 IMAGING — DX LEFT TIBIA AND FIBULA - 2 VIEW
2 series · 2 of 2 positions shown · non-contrast
Comparison: None.

CLINICAL DATA: Pain status post fall

EXAM:
LEFT TIBIA AND FIBULA - 2 VIEW

[tibia ap]
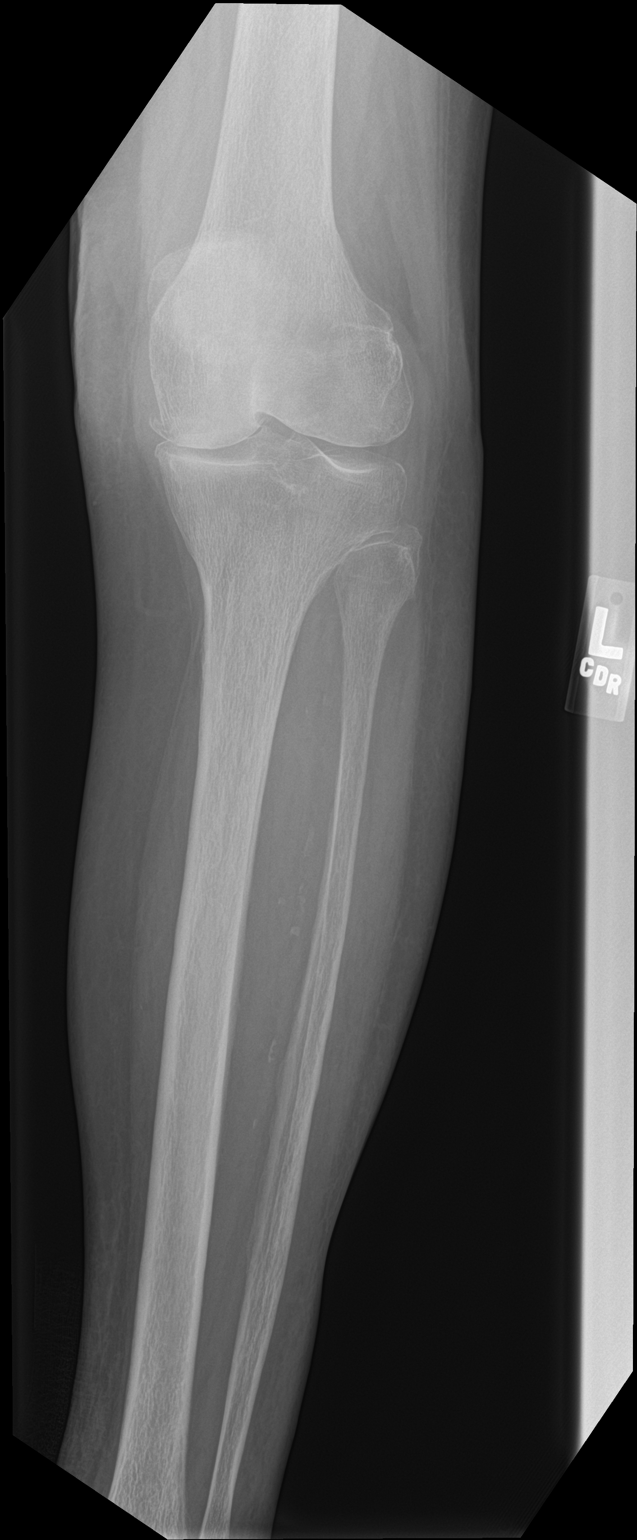

[tibia lat]
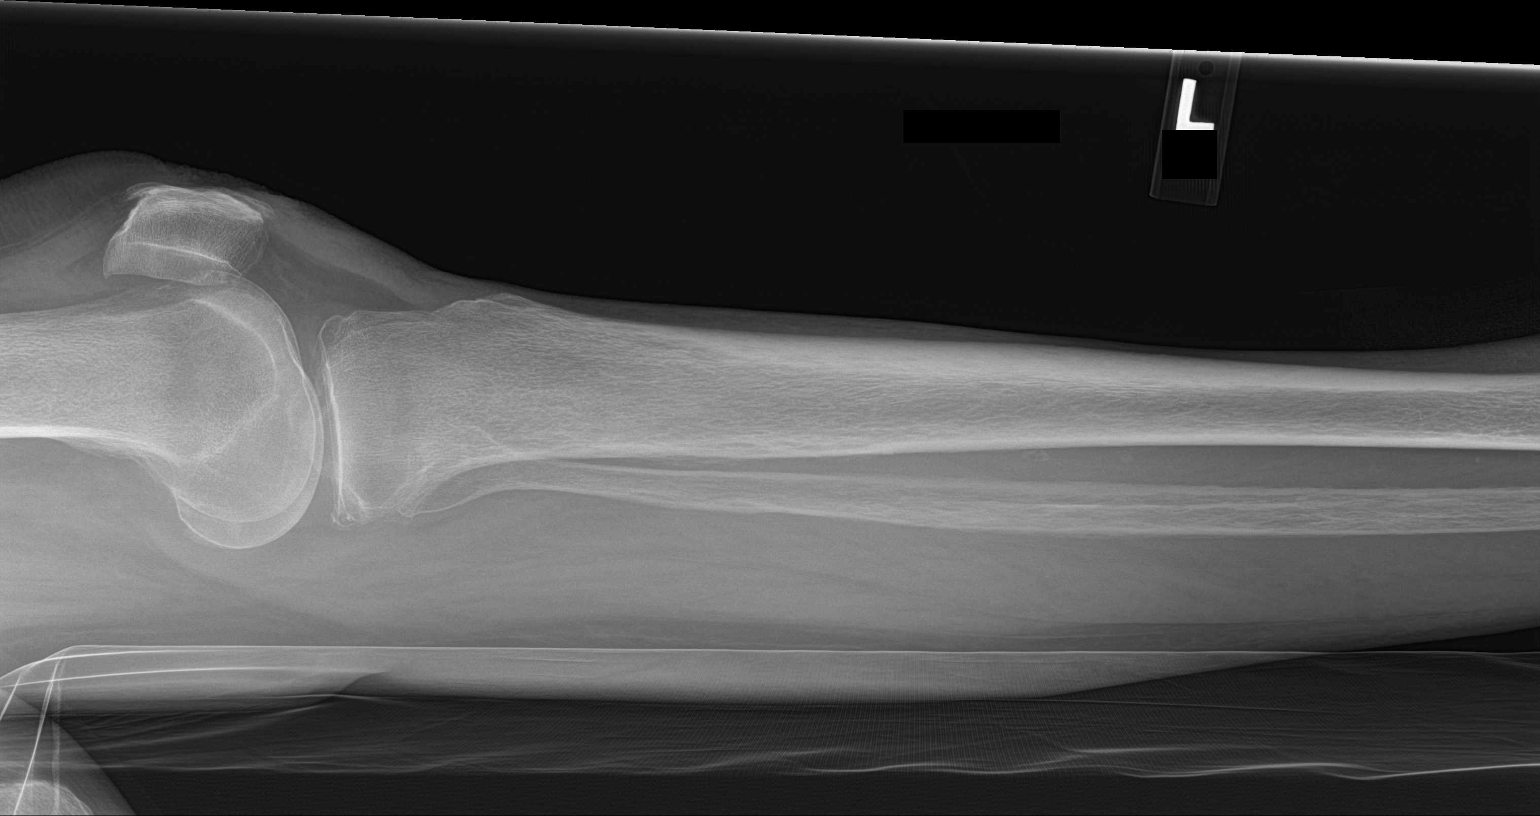

[2 of 2 positions shown; findings below may reference images not displayed]

FINDINGS: There is no acute displaced fracture or dislocation. There are mild
to moderate multicompartmental degenerative changes of the knee.
IMPRESSION: No acute displaced fracture or dislocation involving the visualized
portions of the tibia and fibula. The distal tibia and fibula were
not visualized on this exam.

## 2022-09-04 ENCOUNTER — Ambulatory Visit: Payer: Medicare Other | Admitting: Family Medicine

## 2022-09-04 ENCOUNTER — Encounter: Payer: Self-pay | Admitting: Family Medicine

## 2022-09-04 VITALS — BP 147/67 | HR 67 | Temp 97.2°F | Ht 66.0 in | Wt 130.0 lb

## 2022-09-04 DIAGNOSIS — E119 Type 2 diabetes mellitus without complications: Secondary | ICD-10-CM

## 2022-09-04 DIAGNOSIS — R413 Other amnesia: Secondary | ICD-10-CM

## 2022-09-04 DIAGNOSIS — G8929 Other chronic pain: Secondary | ICD-10-CM

## 2022-09-04 DIAGNOSIS — I10 Essential (primary) hypertension: Secondary | ICD-10-CM

## 2022-09-04 DIAGNOSIS — F39 Unspecified mood [affective] disorder: Secondary | ICD-10-CM

## 2022-09-04 NOTE — Progress Notes (Unsigned)
Pt. Was seen in home for establishment of care. Pt. Has history of stroke and unable to walk. Pt. Lives with son and has 24 hour care in home. Pt. Is scheduled for lab draw and medication refills.

## 2022-09-04 NOTE — Progress Notes (Unsigned)
New Patient Office Visit  Subjective    Patient ID: Nicole [name], female    DOB: March 30, 1929  Age: 89 y.o. MRN: [id number]  CC:  Chief Complaint  Patient presents with   Establish Care    New pt. Home visit.     HPI Nicole [name] presents to establish care ***  Outpatient Encounter Medications as of 09/04/2022  Medication Sig   [DISCONTINUED] escitalopram (LEXAPRO) 5 MG tablet Take 5 mg by mouth daily. 1po qd   [DISCONTINUED] LORazepam (ATIVAN) 0.5 MG tablet Take 0.5 mg by mouth every 4 (four) hours as needed for anxiety.   amLODipine (NORVASC) 10 MG tablet Take 1 tablet (10 mg total) by mouth daily. (Patient not taking: Reported on 09/04/2022)   aspirin 81 MG chewable tablet Chew 1 tablet (81 mg total) by mouth daily. (Patient not taking: Reported on 09/04/2022)   escitalopram (LEXAPRO) 5 MG tablet Take 1 tablet (5 mg total) by mouth daily. 1po qd   HYDROcodone-acetaminophen (NORCO/VICODIN) 5-325 MG tablet Take 1 tablet by mouth every 4 (four) hours as needed for moderate pain. (Patient not taking: Reported on 09/04/2022)   lisinopril (PRINIVIL,ZESTRIL) 5 MG tablet Take 1 tablet (5 mg total) by mouth daily.   LORazepam (ATIVAN) 0.5 MG tablet Take 1 tablet (0.5 mg total) by mouth every 4 (four) hours as needed for anxiety.   pravastatin (PRAVACHOL) 40 MG tablet Take 1 tablet (40 mg total) by mouth daily. (Patient not taking: Reported on 09/04/2022)   vitamin B-12 (CYANOCOBALAMIN) 1000 MCG tablet Take 1,000 mcg by mouth daily. (Patient not taking: Reported on 09/04/2022)   No facility-administered encounter medications on file as of 09/04/2022.    Past Medical History:  Diagnosis Date   Hypertension    Polio     Past Surgical History:  Procedure Laterality Date   ABDOMINAL HYSTERECTOMY     CHOLECYSTECTOMY     PACEMAKER IMPLANT N/A 05/21/2017   Procedure: PACEMAKER IMPLANT;  Surgeon: Marinus Maw, MD;  Location: [facility];  Service: Cardiovascular;  Laterality: N/A;     Family History  Problem Relation Age of Onset   Cancer Mother        stomach   Cancer Sister        breast    Social History   Socioeconomic History   Marital status: Widowed    Spouse name: Not on file   Number of children: Not on file   Years of education: Not on file   Highest education level: Not on file  Occupational History   Not on file  Tobacco Use   Smoking status: Never   Smokeless tobacco: Never  Vaping Use   Vaping Use: Never used  Substance and Sexual Activity   Alcohol use: Never   Drug use: Never   Sexual activity: Not on file  Other Topics Concern   Not on file  Social History Narrative   Not on file   Social Determinants of Health   Financial Resource Strain: Not on file  Food Insecurity: Not on file  Transportation Needs: Not on file  Physical Activity: Not on file  Stress: Not on file  Social Connections: Not on file  Intimate Partner Violence: Not on file    ROS      Objective    BP (!) 147/67 (BP Location: Right Arm, Patient Position: Sitting, Cuff Size: Normal)   Pulse 67   Temp (!) 97.2 F (36.2 C)   Ht  (1.676 m)  Wt 130 lb (59 kg)   SpO2 93%   BMI 20.98 kg/m   Physical Exam  {Labs (Optional):23779}    Assessment & Plan:   Problem List Items Addressed This Visit       Cardiovascular and Mediastinum   Essential hypertension     Endocrine   Type 2 diabetes mellitus without complication     Other   Memory deficit   Chronic hip pain   Relevant Medications   escitalopram (LEXAPRO) 5 MG tablet   Other Visit Diagnoses     Mood disorder    -  Primary   Relevant Medications   LORazepam (ATIVAN) 0.5 MG tablet   escitalopram (LEXAPRO) 5 MG tablet       No follow-ups on file.   Nicole Salter, MD

## 2022-09-18 MED ORDER — LORAZEPAM 0.5 MG PO TABS
0.5000 mg | ORAL_TABLET | ORAL | 0 refills | Status: AC | PRN
Start: 2022-09-18 — End: ?

## 2022-09-18 MED ORDER — ESCITALOPRAM OXALATE 5 MG PO TABS
5.0000 mg | ORAL_TABLET | Freq: Every day | ORAL | 0 refills | Status: AC
Start: 2022-09-18 — End: 2022-12-17
